# Patient Record
Sex: Female | Born: 1935
Health system: Southern US, Community
[De-identification: ages and names within clinical notes are randomized; demographics above are authoritative.]

## PROBLEM LIST (undated history)

## (undated) DIAGNOSIS — Z9889 Other specified postprocedural states: Secondary | ICD-10-CM

## (undated) DIAGNOSIS — M1990 Unspecified osteoarthritis, unspecified site: Secondary | ICD-10-CM

## (undated) DIAGNOSIS — E11319 Type 2 diabetes mellitus with unspecified diabetic retinopathy without macular edema: Secondary | ICD-10-CM

## (undated) DIAGNOSIS — I10 Essential (primary) hypertension: Secondary | ICD-10-CM

## (undated) DIAGNOSIS — E119 Type 2 diabetes mellitus without complications: Secondary | ICD-10-CM

## (undated) DIAGNOSIS — R112 Nausea with vomiting, unspecified: Secondary | ICD-10-CM

## (undated) DIAGNOSIS — H3530 Unspecified macular degeneration: Secondary | ICD-10-CM

## (undated) DIAGNOSIS — N390 Urinary tract infection, site not specified: Secondary | ICD-10-CM

## (undated) DIAGNOSIS — M549 Dorsalgia, unspecified: Secondary | ICD-10-CM

## (undated) DIAGNOSIS — R319 Hematuria, unspecified: Secondary | ICD-10-CM

## (undated) DIAGNOSIS — R3915 Urgency of urination: Principal | ICD-10-CM

## (undated) HISTORY — DX: Type 2 diabetes mellitus with unspecified diabetic retinopathy without macular edema: E11.319

## (undated) HISTORY — DX: Unspecified macular degeneration: H35.30

## (undated) HISTORY — DX: Dorsalgia, unspecified: M54.9

## (undated) HISTORY — PX: BREAST SURGERY: SHX581

## (undated) HISTORY — PX: ABDOMINAL HYSTERECTOMY: SHX81

## (undated) HISTORY — DX: Hematuria, unspecified: R31.9

## (undated) HISTORY — DX: Urinary tract infection, site not specified: N39.0

## (undated) HISTORY — PX: C-EYE SURGERY PROCEDURE: 102257504

## (undated) HISTORY — DX: Urgency of urination: R39.15

## (undated) HISTORY — PX: EYE SURGERY: SHX253

---

## 1983-04-20 HISTORY — PX: RECTOCELE REPAIR: SHX761

## 2000-09-02 ENCOUNTER — Ambulatory Visit (HOSPITAL_COMMUNITY): Admission: RE | Admit: 2000-09-02 | Discharge: 2000-09-02 | Payer: Self-pay | Admitting: Internal Medicine

## 2001-06-11 ENCOUNTER — Emergency Department (HOSPITAL_COMMUNITY): Admission: EM | Admit: 2001-06-11 | Discharge: 2001-06-11 | Payer: Self-pay | Admitting: *Deleted

## 2001-07-05 ENCOUNTER — Other Ambulatory Visit: Admission: RE | Admit: 2001-07-05 | Discharge: 2001-07-05 | Payer: Self-pay | Admitting: Obstetrics and Gynecology

## 2001-11-15 ENCOUNTER — Ambulatory Visit (HOSPITAL_COMMUNITY): Admission: RE | Admit: 2001-11-15 | Discharge: 2001-11-15 | Payer: Self-pay | Admitting: Internal Medicine

## 2001-11-15 ENCOUNTER — Encounter: Payer: Self-pay | Admitting: Internal Medicine

## 2002-08-02 ENCOUNTER — Encounter: Payer: Self-pay | Admitting: Internal Medicine

## 2002-08-02 ENCOUNTER — Ambulatory Visit (HOSPITAL_COMMUNITY): Admission: RE | Admit: 2002-08-02 | Discharge: 2002-08-02 | Payer: Self-pay | Admitting: Internal Medicine

## 2003-01-31 ENCOUNTER — Ambulatory Visit (HOSPITAL_COMMUNITY): Admission: RE | Admit: 2003-01-31 | Discharge: 2003-01-31 | Payer: Self-pay | Admitting: Obstetrics and Gynecology

## 2003-01-31 ENCOUNTER — Encounter: Payer: Self-pay | Admitting: Obstetrics and Gynecology

## 2003-05-12 ENCOUNTER — Emergency Department (HOSPITAL_COMMUNITY): Admission: EM | Admit: 2003-05-12 | Discharge: 2003-05-12 | Payer: Self-pay | Admitting: Emergency Medicine

## 2003-07-19 ENCOUNTER — Ambulatory Visit (HOSPITAL_COMMUNITY): Admission: RE | Admit: 2003-07-19 | Discharge: 2003-07-19 | Payer: Self-pay | Admitting: Internal Medicine

## 2003-08-27 ENCOUNTER — Ambulatory Visit (HOSPITAL_COMMUNITY): Admission: RE | Admit: 2003-08-27 | Discharge: 2003-08-27 | Payer: Self-pay | Admitting: Internal Medicine

## 2004-06-16 IMAGING — CR DG CHEST 2V
2 series · 2 of 2 positions shown · non-contrast
Comparison: none

CLINICAL DATA: Atypical chest pain.
 CHEST TWO VIEWS 
 Comparison 06/09/98.
 Upper normal heart size.  Normal mediastinal contours and vascularity.  Chronic bronchitic changes.  Slight interstitial prominence, chronic.  No acute infiltrate, failure, or effusion.  Minimal spur formation thoracic spine.  
 IMPRESSION
 Mild chronic bronchitic changes.  No acute abnormalities.

[view not recorded (1 of 2)]
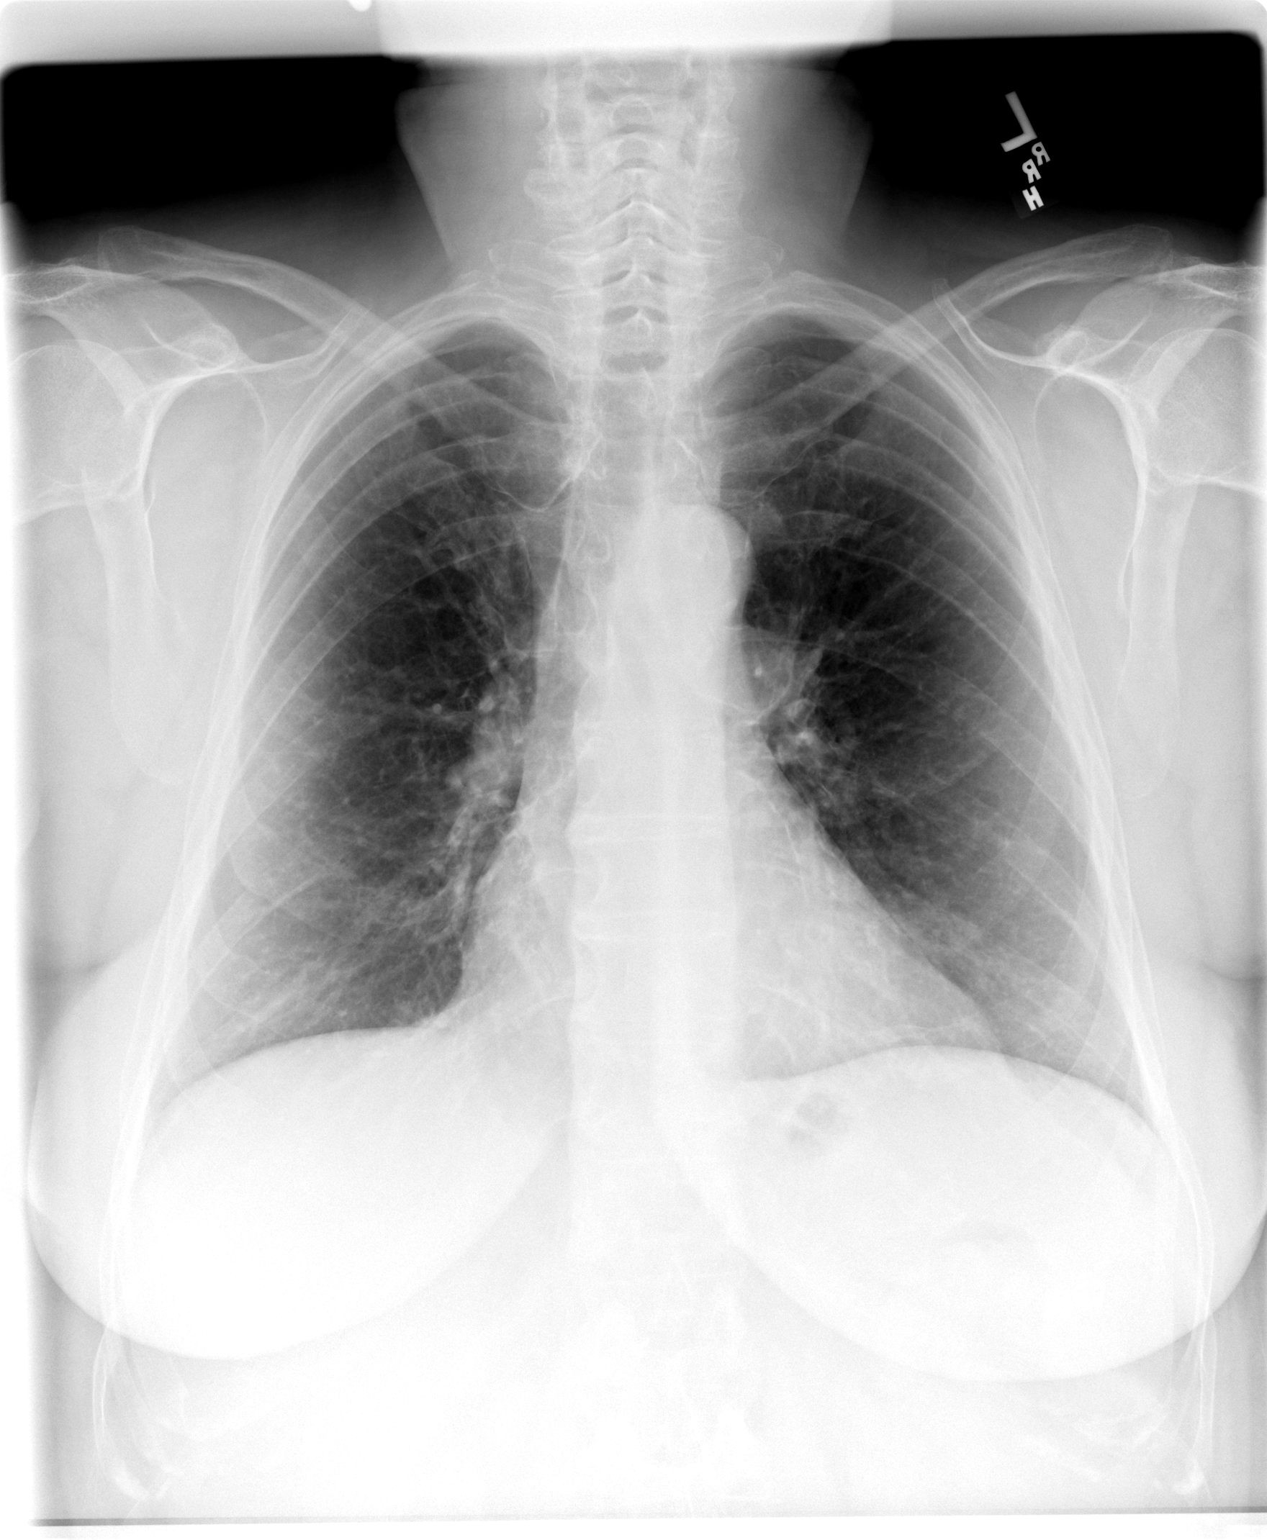

[view not recorded (2 of 2)]
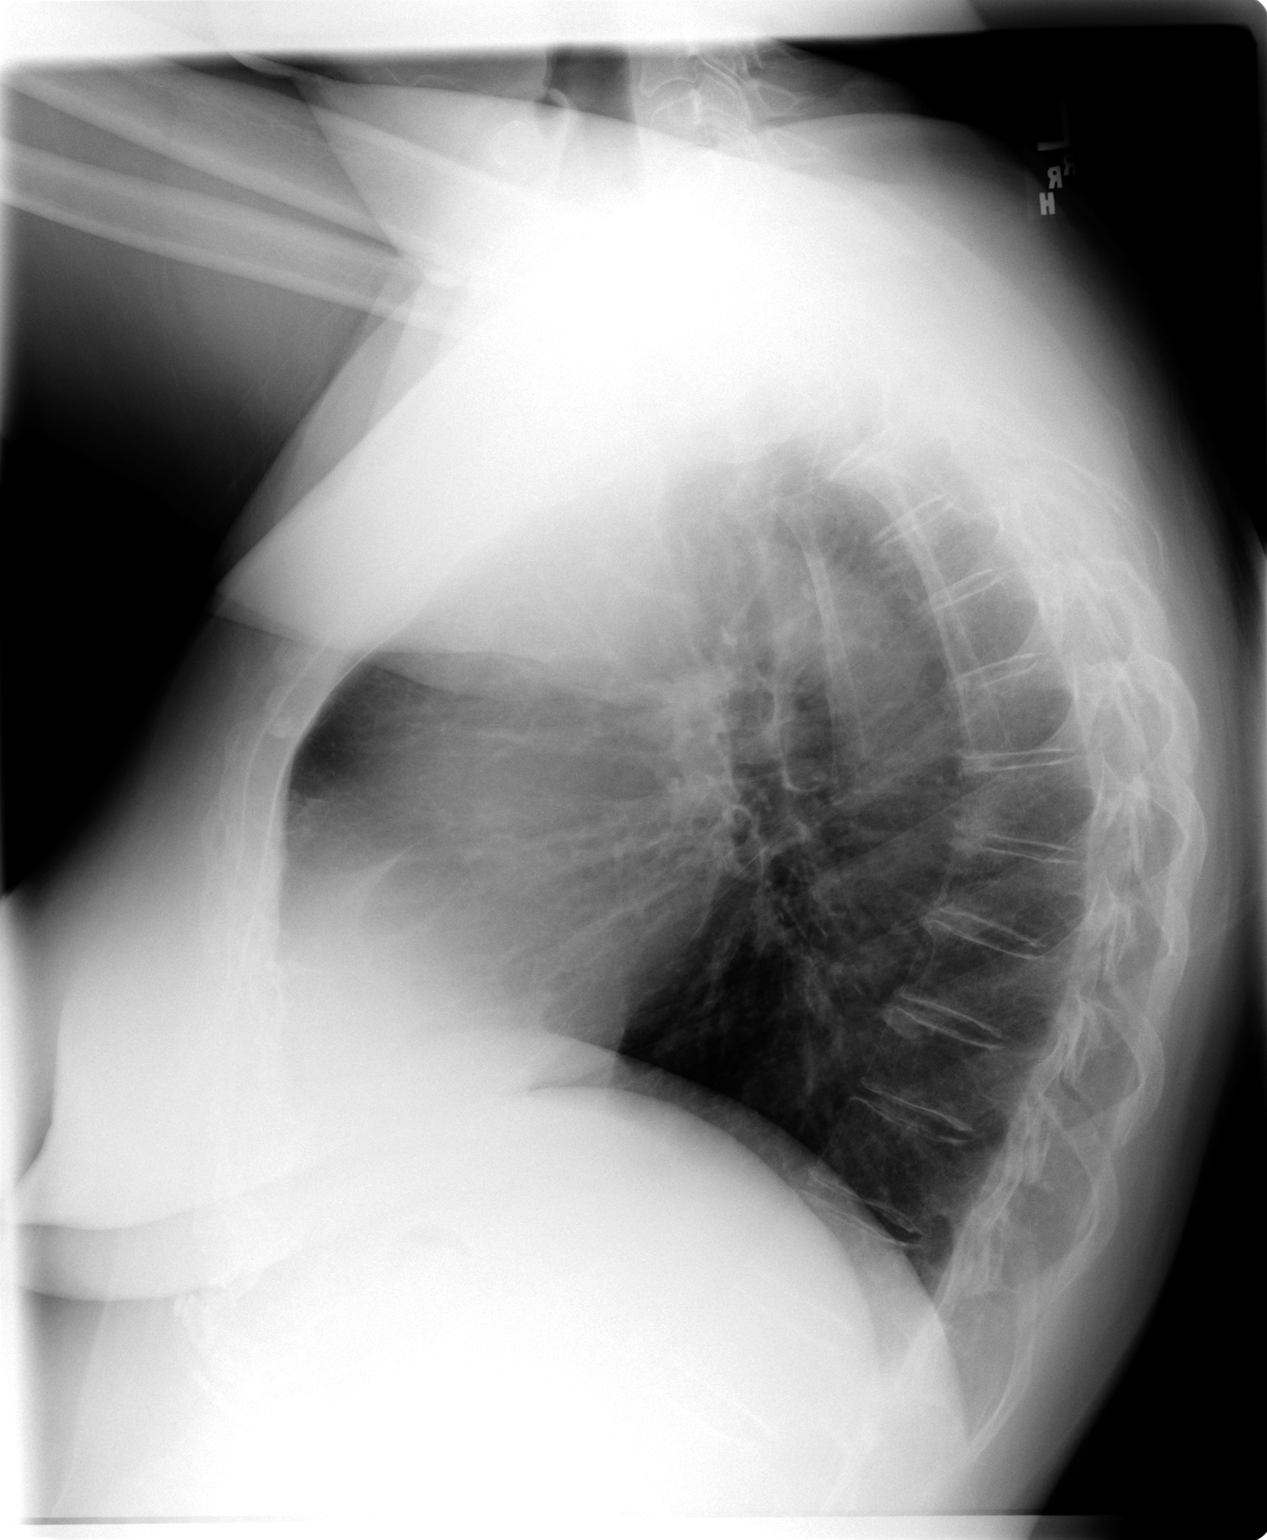

[2 of 2 positions shown; findings below may reference images not displayed]

## 2004-06-23 ENCOUNTER — Emergency Department (HOSPITAL_COMMUNITY): Admission: EM | Admit: 2004-06-23 | Discharge: 2004-06-23 | Payer: Self-pay | Admitting: *Deleted

## 2004-06-25 ENCOUNTER — Ambulatory Visit (HOSPITAL_COMMUNITY): Admission: RE | Admit: 2004-06-25 | Discharge: 2004-06-25 | Payer: Self-pay | Admitting: Internal Medicine

## 2004-09-15 ENCOUNTER — Ambulatory Visit (HOSPITAL_COMMUNITY): Admission: RE | Admit: 2004-09-15 | Discharge: 2004-09-15 | Payer: Self-pay | Admitting: Internal Medicine

## 2005-06-21 ENCOUNTER — Ambulatory Visit (HOSPITAL_COMMUNITY): Admission: RE | Admit: 2005-06-21 | Discharge: 2005-06-21 | Payer: Self-pay | Admitting: Internal Medicine

## 2005-06-21 ENCOUNTER — Ambulatory Visit: Payer: Self-pay | Admitting: Internal Medicine

## 2005-09-01 ENCOUNTER — Ambulatory Visit (HOSPITAL_COMMUNITY): Admission: RE | Admit: 2005-09-01 | Discharge: 2005-09-01 | Payer: Self-pay | Admitting: Internal Medicine

## 2005-10-06 ENCOUNTER — Ambulatory Visit (HOSPITAL_COMMUNITY): Admission: RE | Admit: 2005-10-06 | Discharge: 2005-10-06 | Payer: Self-pay | Admitting: Otolaryngology

## 2006-01-03 ENCOUNTER — Emergency Department (HOSPITAL_COMMUNITY): Admission: EM | Admit: 2006-01-03 | Discharge: 2006-01-03 | Payer: Self-pay | Admitting: Emergency Medicine

## 2006-01-06 ENCOUNTER — Ambulatory Visit (HOSPITAL_COMMUNITY): Admission: RE | Admit: 2006-01-06 | Discharge: 2006-01-06 | Payer: Self-pay | Admitting: Internal Medicine

## 2006-05-11 ENCOUNTER — Ambulatory Visit: Payer: Self-pay | Admitting: Internal Medicine

## 2006-06-02 ENCOUNTER — Ambulatory Visit: Payer: Self-pay | Admitting: Internal Medicine

## 2006-06-10 ENCOUNTER — Ambulatory Visit (HOSPITAL_COMMUNITY): Admission: RE | Admit: 2006-06-10 | Discharge: 2006-06-10 | Payer: Self-pay | Admitting: Internal Medicine

## 2006-09-22 ENCOUNTER — Ambulatory Visit (HOSPITAL_COMMUNITY): Admission: RE | Admit: 2006-09-22 | Discharge: 2006-09-22 | Payer: Self-pay | Admitting: Internal Medicine

## 2007-06-27 ENCOUNTER — Ambulatory Visit (HOSPITAL_COMMUNITY): Admission: RE | Admit: 2007-06-27 | Discharge: 2007-06-27 | Payer: Self-pay | Admitting: Internal Medicine

## 2009-08-02 ENCOUNTER — Emergency Department (HOSPITAL_COMMUNITY): Admission: EM | Admit: 2009-08-02 | Discharge: 2009-08-02 | Payer: Self-pay | Admitting: Emergency Medicine

## 2010-05-11 ENCOUNTER — Encounter: Payer: Self-pay | Admitting: Internal Medicine

## 2010-07-22 ENCOUNTER — Other Ambulatory Visit (HOSPITAL_COMMUNITY): Payer: Self-pay | Admitting: Internal Medicine

## 2010-07-22 DIAGNOSIS — Z139 Encounter for screening, unspecified: Secondary | ICD-10-CM

## 2010-08-04 ENCOUNTER — Ambulatory Visit (HOSPITAL_COMMUNITY)
Admission: RE | Admit: 2010-08-04 | Discharge: 2010-08-04 | Disposition: A | Discharge status: Home / Self Care | Payer: Medicare Other | Source: Ambulatory Visit | Attending: Internal Medicine | Admitting: Internal Medicine

## 2010-08-04 DIAGNOSIS — Z1231 Encounter for screening mammogram for malignant neoplasm of breast: Secondary | ICD-10-CM | POA: Insufficient documentation

## 2010-08-04 DIAGNOSIS — Z139 Encounter for screening, unspecified: Secondary | ICD-10-CM

## 2010-08-05 ENCOUNTER — Other Ambulatory Visit: Payer: Self-pay | Admitting: Internal Medicine

## 2010-08-05 DIAGNOSIS — R928 Other abnormal and inconclusive findings on diagnostic imaging of breast: Secondary | ICD-10-CM

## 2010-08-12 ENCOUNTER — Encounter (INDEPENDENT_AMBULATORY_CARE_PROVIDER_SITE_OTHER): Payer: Medicare Other | Admitting: Internal Medicine

## 2010-08-12 ENCOUNTER — Ambulatory Visit (HOSPITAL_COMMUNITY)
Admission: RE | Admit: 2010-08-12 | Discharge: 2010-08-12 | Disposition: A | Discharge status: Home / Self Care | Payer: Medicare Other | Source: Ambulatory Visit | Attending: Internal Medicine | Admitting: Internal Medicine

## 2010-08-12 DIAGNOSIS — K644 Residual hemorrhoidal skin tags: Secondary | ICD-10-CM

## 2010-08-12 DIAGNOSIS — Z860100 Personal history of colon polyps, unspecified: Secondary | ICD-10-CM | POA: Insufficient documentation

## 2010-08-12 DIAGNOSIS — Z09 Encounter for follow-up examination after completed treatment for conditions other than malignant neoplasm: Secondary | ICD-10-CM | POA: Insufficient documentation

## 2010-08-12 DIAGNOSIS — Z794 Long term (current) use of insulin: Secondary | ICD-10-CM | POA: Insufficient documentation

## 2010-08-12 DIAGNOSIS — Z79899 Other long term (current) drug therapy: Secondary | ICD-10-CM | POA: Insufficient documentation

## 2010-08-12 DIAGNOSIS — E119 Type 2 diabetes mellitus without complications: Secondary | ICD-10-CM | POA: Insufficient documentation

## 2010-08-12 DIAGNOSIS — I10 Essential (primary) hypertension: Secondary | ICD-10-CM | POA: Insufficient documentation

## 2010-08-13 LAB — GLUCOSE, CAPILLARY: Glucose-Capillary: 168 mg/dL — ABNORMAL HIGH (ref 70–99)

## 2010-08-19 ENCOUNTER — Ambulatory Visit (HOSPITAL_COMMUNITY)
Admission: RE | Admit: 2010-08-19 | Discharge: 2010-08-19 | Disposition: A | Discharge status: Home / Self Care | Payer: Medicare Other | Source: Ambulatory Visit | Attending: Internal Medicine | Admitting: Internal Medicine

## 2010-08-19 DIAGNOSIS — R928 Other abnormal and inconclusive findings on diagnostic imaging of breast: Secondary | ICD-10-CM | POA: Insufficient documentation

## 2010-08-28 NOTE — Op Note (Signed)
  [name], [name]                ACCOUNT NO.:  [id number]  MEDICAL RECORD NO.:  [id number]           PATIENT TYPE:  O  LOCATION:  [facility]YP                          FACILITY:  [facility]  PHYSICIAN:  Lionel December, M.D.    DATE OF BIRTH:  1932/06/01  DATE OF PROCEDURE:  08/12/2010 DATE OF DISCHARGE:                              OPERATIVE REPORT   PROCEDURE:  Colonoscopy.  INDICATION:  [name] is a 77 year old Caucasian female, who has a history of colonic adenomas.  Her last exam was in March 2007.  She is undergoing surveillance exam.  She has no GI symptoms.  Family history is negative for colorectal carcinoma.  Procedures were reviewed with the patient and informed consent was obtained.  MEDS FOR CONSCIOUS SEDATION:  Demerol 50 mg, Versed 5 mg.  FINDINGS:  Procedure performed in endoscopy suite.  The patient's vital signs and O2 sat were monitored during the procedure and remained stable.  The patient was placed in left lateral recumbent position. Rectal examination performed.  No abnormality noted on external exam or digital exam.  Pentax videoscope was placed through rectum and advanced under vision into sigmoid colon beyond.  Preparation was excellent. Once the scope was at splenic flexure, the patient was turned into supine position and scope was easily advanced into cecum, which was identified by appendiceal orifice and ileocecal valve.  Pictures were taken for the record.  As the scope was withdrawn, colonic mucosa was carefully examined and was normal throughout.  Rectal mucosa similarly was normal.  Scope was retroflexed to examine anorectal junction and hemorrhoids were noted below the dentate line, one was prominent. Endoscope was straightened and withdrawn.  Withdrawal time was 9 minutes.  The patient tolerated the procedure well.  FINAL DIAGNOSIS:  Normal colonoscopy except external hemorrhoids.  RECOMMENDATIONS:  Standard instructions given.  [name] may not  need another colonoscopy, but I have recommended she should come back and see [place] in the office in 5 years and then we could determine whether or not she should have any more exams.          ______________________________ Lionel December, M.D.     NR/MEDQ  D:  08/12/2010  T:  08/12/2010  Job:  [id number]  cc:   Kingsley Callander. Ouida Sills, MD Fax: [phone]  Electronically Signed by Lionel December M.D. on 08/28/2010 12:15:12 PM

## 2010-09-04 NOTE — Op Note (Signed)
[name], BOESEN                ACCOUNT NO.:  [id number]   MEDICAL RECORD NO.:  [id number]          PATIENT TYPE:  AMB   LOCATION:  DAY                           FACILITY:  [facility]   PHYSICIAN:  Lionel December, M.D.    DATE OF BIRTH:  11/21/1932   DATE OF PROCEDURE:  06/21/2005  DATE OF DISCHARGE:                                 OPERATIVE REPORT   PROCEDURE:  Colonoscopy.   INDICATIONS:  Encarnacion is a 77 year old Caucasian female who is here for  surveillance at a colonoscopy. Her last exam was five years ago with removal  of the tubular adenoma. She remains asymptomatic from GI standpoint, and  family history is negative for colorectal carcinoma. Procedure and risks  were reviewed with the patient, and informed consent was obtained.   MEDICINES FOR CONSCIOUS SEDATION:  Demerol 50 mg IV, Versed 4 mg IV.   FINDINGS:  Procedure performed in endoscopy suite. The patient's vital signs  and O2 saturation were monitored during the procedure and remained stable.  The patient was placed in left lateral position and rectal examination  performed. No abnormality noted on external or digital exam. Olympus  videoscope was placed in rectum and advanced under vision into sigmoid colon  and beyond. Preparation was excellent. Scope was passed into cecum which was  identified by appendiceal orifice and ileocecal valve. Pictures taken for  the record. As the scope was withdrawn, colonic mucosa was carefully  examined for the second time, and there were no polyps and/or tumor masses.  Rectal mucosa similarly was normal. Scope was retroflexed to examine  anorectal junction, and moderate-sized hemorrhoids were noted below the  dentate line. Endoscope was straightened and withdrawn. The patient  tolerated the procedure well.   FINAL DIAGNOSIS:  Normal colonoscopy except external hemorrhoids.   RECOMMENDATIONS:  She should continue with yearly Hemoccults. She may  consider next exam in five  years.      Lionel December, M.D.  Electronically Signed     NR/MEDQ  D:  06/21/2005  T:  06/22/2005  Job:  [id number]   cc:   Kingsley Callander. Ouida Sills, MD  Fax: [phone]

## 2010-10-13 ENCOUNTER — Ambulatory Visit (HOSPITAL_COMMUNITY)
Admission: RE | Admit: 2010-10-13 | Discharge: 2010-10-13 | Disposition: A | Discharge status: Home / Self Care | Payer: Medicare Other | Source: Ambulatory Visit | Attending: Orthopaedic Surgery | Admitting: Orthopaedic Surgery

## 2010-10-13 DIAGNOSIS — M6281 Muscle weakness (generalized): Secondary | ICD-10-CM | POA: Insufficient documentation

## 2010-10-13 DIAGNOSIS — E119 Type 2 diabetes mellitus without complications: Secondary | ICD-10-CM | POA: Insufficient documentation

## 2010-10-13 DIAGNOSIS — IMO0001 Reserved for inherently not codable concepts without codable children: Secondary | ICD-10-CM | POA: Insufficient documentation

## 2010-10-13 DIAGNOSIS — M542 Cervicalgia: Secondary | ICD-10-CM | POA: Insufficient documentation

## 2010-10-13 DIAGNOSIS — M25519 Pain in unspecified shoulder: Secondary | ICD-10-CM | POA: Insufficient documentation

## 2010-10-16 ENCOUNTER — Ambulatory Visit (HOSPITAL_COMMUNITY)
Admission: RE | Admit: 2010-10-16 | Discharge: 2010-10-16 | Disposition: A | Discharge status: Home / Self Care | Payer: Medicare Other | Source: Ambulatory Visit | Attending: Internal Medicine | Admitting: Internal Medicine

## 2010-10-20 ENCOUNTER — Ambulatory Visit (HOSPITAL_COMMUNITY)
Admission: RE | Admit: 2010-10-20 | Discharge: 2010-10-20 | Disposition: A | Discharge status: Home / Self Care | Payer: Medicare Other | Source: Ambulatory Visit | Attending: Orthopaedic Surgery | Admitting: Orthopaedic Surgery

## 2010-10-20 DIAGNOSIS — E119 Type 2 diabetes mellitus without complications: Secondary | ICD-10-CM | POA: Insufficient documentation

## 2010-10-20 DIAGNOSIS — M6281 Muscle weakness (generalized): Secondary | ICD-10-CM | POA: Insufficient documentation

## 2010-10-20 DIAGNOSIS — IMO0001 Reserved for inherently not codable concepts without codable children: Secondary | ICD-10-CM | POA: Insufficient documentation

## 2010-10-20 DIAGNOSIS — M542 Cervicalgia: Secondary | ICD-10-CM | POA: Insufficient documentation

## 2010-10-20 DIAGNOSIS — M25519 Pain in unspecified shoulder: Secondary | ICD-10-CM | POA: Insufficient documentation

## 2010-10-22 ENCOUNTER — Ambulatory Visit (HOSPITAL_COMMUNITY)
Admission: RE | Admit: 2010-10-22 | Discharge: 2010-10-22 | Disposition: A | Discharge status: Home / Self Care | Payer: Medicare Other | Source: Ambulatory Visit | Attending: Internal Medicine | Admitting: Internal Medicine

## 2010-10-27 ENCOUNTER — Ambulatory Visit (HOSPITAL_COMMUNITY)
Admission: RE | Admit: 2010-10-27 | Discharge: 2010-10-27 | Disposition: A | Discharge status: Home / Self Care | Payer: Medicare Other | Source: Ambulatory Visit | Attending: Internal Medicine | Admitting: Internal Medicine

## 2010-10-27 DIAGNOSIS — M6281 Muscle weakness (generalized): Secondary | ICD-10-CM | POA: Insufficient documentation

## 2010-10-27 DIAGNOSIS — M25519 Pain in unspecified shoulder: Secondary | ICD-10-CM | POA: Insufficient documentation

## 2010-10-27 DIAGNOSIS — M542 Cervicalgia: Secondary | ICD-10-CM | POA: Insufficient documentation

## 2010-10-27 NOTE — Progress Notes (Addendum)
Occupational Therapy Treatment  Patient Name: [name] MRN: [id number] Today's Date: 10/27/2010  HPI: Pain Assessment Currently in Pain?: Yes Pain Score:   6 Pain Location: Neck Pain Orientation: Right Pain Type: Chronic pain Pain Radiating Towards: up neck Pain Onset: Other (comment) (chronic but has increased in the last few days)  Precautions/Restrictions    Mobility       Exercise/Treatments @FLOW [phone]  Goals Long Term Goals Long Term Goal 1: Independent with HEP and pain control techniques Long Term Goal 1 Progress: Progressing toward goal Long Term Goal 2: Decrease pain in right shoulder and neck to 2/10 Long Term Goal 2 Progress: Progressing toward goal Long Term Goal 3: Increase cervical and shoulder AROM to WNL for increased safety when driving Long Term Goal 3 Progress: Progressing toward goal Long Term Goal 4: Decrease right cervical and shoulder fascial restrictions to minimal Long Term Goal 4 Progress: Progressing toward goal Long Term Goal 5: Increase right grip strength by 15 pounds for increased independence opening containers Long Term Goal 5 Progress: Progressing toward goal End of Session Patient Active Problem List  Diagnoses  . Muscle weakness (generalized)  . Cervicalgia  . Pain in joint, shoulder region   End of Session Activity Tolerance: Patient tolerated treatment well OT Assessment and Plan Clinical Impression Statement: added 1 pound to supine exercises, educated patient on joint protection techniques and good body mechanics to help with pain management. Prognosis: Good OT Duration: 6 weeks OT  Treatment/Interventions: Therapeutic exercise;Patient/family education;Manual therapy OT Plan: Increase reps with supine and seated   Tamy Accardo L 10/27/2010, 9:34 AM

## 2010-10-30 ENCOUNTER — Ambulatory Visit (HOSPITAL_COMMUNITY)
Admission: RE | Admit: 2010-10-30 | Discharge: 2010-10-30 | Disposition: A | Discharge status: Home / Self Care | Payer: Medicare Other | Source: Ambulatory Visit | Attending: Internal Medicine | Admitting: Internal Medicine

## 2010-10-30 NOTE — Progress Notes (Signed)
Occupational Therapy Treatment  Patient Name: [name] MRN: [id number] Today's Date: 10/30/2010        Time in:  8:06  Time out:  9:09  HPI: Symptoms/Limitations Symptoms: It is still hurting but it is ok. Pain Assessment Currently in Pain?: Yes Pain Score:   5 Pain Location: Shoulder Pain Orientation: Right Pain Type: Chronic pain Pain Radiating Towards: up neck and down arm Pain Relieving Factors: MFR helped relax and relieve the pain, per patient report.  Precautions/Restrictions    Mobility       Exercise/Treatments Cervical Exercises Neck Flexion: AROM;10 reps;Seated Neck Extension: AROM;10 reps;Seated Neck Lateral Flexion - Right: 10 reps;AROM;Seated Neck Lateral Flexion - Left: AROM;10 reps;Seated Neck Rotation - Right: 10 reps;AROM;Seated Neck Rotation - Left: AROM;10 reps;Seated Shoulder Flexion: PROM;Strengthening;10 reps;Seated (seated with 1#) Shoulder Extension: Theraband Theraband Level (Shoulder Extension): Level 3 (Green) (x10) Shoulder ABduction: PROM;Strengthening;10 reps;Supine;Seated (seated with 1#) Therapy Ball (Shoulder Abduction): x20 Shoulder Horizontal ABduction: PROM;Strengthening;10 reps;Seated;Right (seated with 1#) Theraband Level (Shoulder Retraction): Level 3 (Green) (x10) Row: Theraband (x10) Shoulder Exercises Shoulder Flexion: PROM;Strengthening;10 reps;Seated (seated with 1#) Shoulder Extension: Theraband Theraband Level (Shoulder Extension): Level 3 (Green) (x10) Theraband Level (Shoulder Retraction): Level 3 (Green) (x10) Shoulder Horizontal ABduction: PROM;Strengthening;10 reps;Seated;Right (seated with 1#) Shoulder ABduction: PROM;Strengthening;10 reps;Supine;Seated (seated with 1#) Therapy Ball (Shoulder Abduction): x20 Row: Theraband (x10) Additional ROM/Strengthening Exercises "W" Arms: x 15 Neurological Re-education Exercises Shoulder Flexion: PROM;Strengthening;10 reps;Seated (seated with 1#) Shoulder ABduction:  PROM;Strengthening;10 reps;Supine;Seated (seated with 1#) Therapy Ball (Shoulder Abduction): x20 Shoulder Horizontal ABduction: PROM;Strengthening;10 reps;Seated;Right (seated with 1#) Manual Therapy Manual Therapy: Myofascial release   Goals   End of Session Patient Active Problem List  Diagnoses  . Muscle weakness (generalized)  . Cervicalgia  . Pain in joint, shoulder region   End of Session Activity Tolerance: Patient tolerated treatment well OT Assessment and Plan Clinical Impression Statement: ROM/Strength: cervical flexion full (full), cervical extension full (full), rotation right 3/4 (1/2-3/4), rotation left full (full), lateral flexion right 1/2 (1/2), lateral flexion left 1/2 (1/2), shoulder flexion 140 4+/5 (121), abduction 135 4+/5 (90), external rotation 90 4+/5 (85), internal rotation 75 4+/5 (65), grip strength right 35 # (34 #), left 50 # (44#) Prognosis: Good OT Frequency: Min 2X/week OT Duration: 4 weeks OT Plan: Continue current treatment.  [name] has met 2/4 short term goals and 0 long term goals.  I would like to continue treating her 3 times per week for 4 weeks.     Vaughan Browner 10/30/2010, 10:40 AM  Visit 6/12

## 2010-11-03 ENCOUNTER — Ambulatory Visit (HOSPITAL_COMMUNITY)
Admission: RE | Admit: 2010-11-03 | Discharge: 2010-11-03 | Disposition: A | Discharge status: Home / Self Care | Payer: Medicare Other | Source: Ambulatory Visit | Attending: Internal Medicine | Admitting: Internal Medicine

## 2010-11-03 NOTE — Progress Notes (Signed)
Occupational Therapy Treatment  Patient Name: [name] MRN: [id number] Today's Date: 11/03/2010 Time In 806 Time Out 903 Manual therapy 806-830 Therapeutic Exercise [id number] Visit 7/16 Reassess on 11/27/10 HPI: Symptoms/Limitations Symptoms: "My neck always has a nagging pain." Pain Assessment Currently in Pain?: Yes Pain Location: Neck Pain Orientation: Posterior Pain Type: Chronic pain  Precautions/Restrictions    Mobility       Exercise/Treatments Cervical Exercises Neck Flexion: 15 reps;AROM;Seated Neck Extension: Seated;AROM;15 reps Neck Lateral Flexion - Right: Seated;AROM;15 reps Neck Lateral Flexion - Left: Seated;AROM;15 reps Neck Rotation - Right: Seated;AROM;15 reps Neck Rotation - Left: Seated;AROM;15 reps Shoulder Flexion: PROM;Strengthening;10 reps;Seated;Other (comment) (2 pounds) Therapy Ball (Shoulder Flexion): x 20 Shoulder Extension: Theraband (green x 15) Shoulder ABduction: PROM;Strengthening;10 reps;Supine;Seated;Other (comment) (2 pounds) Therapy Ball (Shoulder Abduction): x20 (added right and left x 10 each direction) Shoulder Horizontal ABduction: PROM;Strengthening;10 reps;Seated;Right Shoulder Retraction:  (2 pounds) Theraband Level (Shoulder Retraction):  (theraband green x 15 reps) Row: Theraband;15 reps (green) Shoulder Internal Rotation: PROM;Strengthening;10 reps;Seated;Supine;Right (2 pounds) Shoulder External Rotation: PROM;Strengthening;Right;10 reps;Supine;Seated (2 pounds) UBE (Upper Arm Bike):  (3 min and 3 min 1.5) Shoulder Exercises Shoulder Flexion: PROM;Strengthening;10 reps;Seated;Other (comment) (2 pounds) Therapy Ball (Shoulder Flexion): x 20 Shoulder Extension: Theraband (green x 15) Shoulder Retraction:  (2 pounds) Theraband Level (Shoulder Retraction):  (theraband green x 15 reps) Shoulder Protraction: Supine;PROM;10 reps;Seated;Strengthening (2 pounds 10 reps) Shoulder Horizontal ABduction: PROM;Strengthening;10  reps;Seated;Right Shoulder ABduction: PROM;Strengthening;10 reps;Supine;Seated;Other (comment) (2 pounds) Therapy Ball (Shoulder Abduction): x20 (added right and left x 10 each direction) Shoulder External Rotation: PROM;Strengthening;Right;10 reps;Supine;Seated (2 pounds) Shoulder Internal Rotation: PROM;Strengthening;10 reps;Seated;Supine;Right (2 pounds) Row: Theraband;15 reps (green) Additional ROM/Strengthening Exercises UBE (Upper Arm Bike):  (3 min and 3 min 1.5) Wall Wash:  (2 min without weight) "W" Arms:  (2 pounds x 10 reps also "x to v" with 2 pounds x 10 reps) Additional Elbow Exercises UBE (Upper Arm Bike):  (3 min and 3 min 1.5) Neurological Re-education Exercises Shoulder Flexion: PROM;Strengthening;10 reps;Seated;Other (comment) (2 pounds) Therapy Ball (Shoulder Flexion): x 20 Shoulder ABduction: PROM;Strengthening;10 reps;Supine;Seated;Other (comment) (2 pounds) Therapy Ball (Shoulder Abduction): x20 (added right and left x 10 each direction) Shoulder Protraction: Supine;PROM;10 reps;Seated;Strengthening (2 pounds 10 reps) Shoulder Horizontal ABduction: PROM;Strengthening;10 reps;Seated;Right Shoulder External Rotation: PROM;Strengthening;Right;10 reps;Supine;Seated (2 pounds) Shoulder Internal Rotation: PROM;Strengthening;10 reps;Seated;Supine;Right (2 pounds) Work Water engineer (Upper Arm Bike):  (3 min and 3 min 1.5) Manual Therapy Manual Therapy: Myofascial release Myofascial Release: MFR and manual stretching to right shoulder, upper arm, scapular region, manual cervical traction, MFR to SCM, trapezius, rhomboids, and suboccipital region to decrease restrictions and increase pain free motion (MFR and manual stretching to right shoulder, upper arm, scap) All exercises completed one time only.    Goals Long Term Goals Long Term Goal 1 Progress: Progressing toward goal Long Term Goal 2 Progress: Progressing toward goal Long Term Goal 3 Progress:  Progressing toward goal Long Term Goal 4 Progress: Progressing toward goal Long Term Goal 5 Progress: Progressing toward goal End of Session Patient Active Problem List  Diagnoses  . Muscle weakness (generalized)  . Cervicalgia  . Pain in joint, shoulder region   End of Session Activity Tolerance: Patient tolerated treatment well General Behavior During Session: [facility] for tasks performed Cognition: [facility] for tasks performed OT Assessment and Plan Clinical Impression Statement: A:  increased to 2 pounds with seated strengthening, x to v, and w arms.  Added wall wash and circle right and left with ball.   (A:  increased  to 2 pounds with seated strengthening, x to v,) OT Plan: Increase to 12 reps with seated strengthening, including x to v and w arms.  add cybex press and row.  Educated/issued patient on HEP for seated strengthening for shoulder flexion, abduction, internal rotation, external rotation.  Sondra Barges Matheny 11/03/2010, 9:12 AM

## 2010-11-03 NOTE — Patient Instructions (Signed)
Added seated strengthening exercises for shoulder flexion, abduction, external rotation, internal rotation to HEP.

## 2010-11-05 ENCOUNTER — Ambulatory Visit (HOSPITAL_COMMUNITY)
Admission: RE | Admit: 2010-11-05 | Discharge: 2010-11-05 | Disposition: A | Discharge status: Home / Self Care | Payer: Medicare Other | Source: Ambulatory Visit | Attending: Internal Medicine | Admitting: Internal Medicine

## 2010-11-05 NOTE — Progress Notes (Signed)
Occupational Therapy Treatment  Patient Name: [name] MRN: [id number] Today's Date: 11/05/2010 Today's Date: 11/03/2010  Time In 805 Time Out 904  Manual therapy 805-830  Therapeutic Exercise [id number]  Visit 8/16  Reassess on 11/27/10  HPI: Symptoms/Limitations Symptoms: "believe it or not, I feel a little better today." Pain Assessment Currently in Pain?: Yes Pain Score:   2 Pain Location: Neck  Precautions/Restrictions    Mobility       Exercise/Treatments Cervical Exercises Neck Flexion: 15 reps;AROM;Seated Neck Extension: Seated;AROM;15 reps Neck Lateral Flexion - Right: Seated;AROM;15 reps Neck Lateral Flexion - Left: Seated;AROM;15 reps Neck Rotation - Right: Seated;AROM;15 reps Neck Rotation - Left: Seated;AROM;15 reps Shoulder Flexion: PROM;Strengthening;10 reps;Seated (12 reps strengthening seated.) Therapy Ball (Shoulder Flexion): x 20 Shoulder Extension: Theraband Theraband Level (Shoulder Extension):  (green x 20) Shoulder ABduction: PROM;Strengthening;10 reps;Supine;Seated;Other (comment) (12 reps seated strengthening) Therapy Ball (Shoulder Abduction): x20 (also righ tand left x 10 each) Shoulder Horizontal ABduction: PROM;Strengthening;10 reps;Seated;Right (12 reps with 2 pounds) Shoulder Retraction:  (seated strengthening 12 reps) Theraband Level (Shoulder Retraction):  (green x 20) Row:  (green therabanc x 20) Shoulder Internal Rotation: PROM;Strengthening;10 reps;Seated;Supine;Right Shoulder External Rotation: PROM;Strengthening;Right;10 reps;Supine;Seated (seated strengthening 2 pounds 12 reps) UBE (Upper Arm Bike):  (3' and 3" 2.0) Shoulder Exercises Shoulder Flexion: PROM;Strengthening;10 reps;Seated (12 reps strengthening seated.) Therapy Ball (Shoulder Flexion): x 20 Shoulder Extension: Theraband Theraband Level (Shoulder Extension):  (green x 20) Shoulder Retraction:  (seated strengthening 12 reps) Theraband Level (Shoulder Retraction):   (green x 20) Shoulder Protraction: Supine;PROM;10 reps;Seated;Strengthening (seated strengthening 12 reps with 2 pounds) Shoulder Horizontal ABduction: PROM;Strengthening;10 reps;Seated;Right (12 reps with 2 pounds) Shoulder ABduction: PROM;Strengthening;10 reps;Supine;Seated;Other (comment) (12 reps seated strengthening) Therapy Ball (Shoulder Abduction): x20 (also righ tand left x 10 each) Shoulder External Rotation: PROM;Strengthening;Right;10 reps;Supine;Seated (seated strengthening 2 pounds 12 reps) Shoulder Internal Rotation: PROM;Strengthening;10 reps;Seated;Supine;Right Row:  (green therabanc x 20) Additional ROM/Strengthening Exercises UBE (Upper Arm Bike):  (3' and 3" 2.0) Cybex Press: 1 plate x 10 reps (1 plate x 10 reps) Cybex Row: 1 plate x 10 reps Wall Wash:  (2 min with 1 pound) "W" Arms:  (2 pounds 15 reps also "x to v" 12 reps with 2 pounds) Additional Elbow Exercises UBE (Upper Arm Bike):  (3' and 3" 2.0) Cybex Press: 1 plate x 10 reps (1 plate x 10 reps) Cybex Row: 1 plate x 10 reps Neurological Re-education Exercises Shoulder Flexion: PROM;Strengthening;10 reps;Seated (12 reps strengthening seated.) Therapy Ball (Shoulder Flexion): x 20 Shoulder ABduction: PROM;Strengthening;10 reps;Supine;Seated;Other (comment) (12 reps seated strengthening) Therapy Ball (Shoulder Abduction): x20 (also righ tand left x 10 each) Shoulder Protraction: Supine;PROM;10 reps;Seated;Strengthening (seated strengthening 12 reps with 2 pounds) Shoulder Horizontal ABduction: PROM;Strengthening;10 reps;Seated;Right (12 reps with 2 pounds) Shoulder External Rotation: PROM;Strengthening;Right;10 reps;Supine;Seated (seated strengthening 2 pounds 12 reps) Shoulder Internal Rotation: PROM;Strengthening;10 reps;Seated;Supine;Right Work Immunologist UBE (Upper Arm Bike):  (3' and 3" 2.0) Manual Therapy Manual Therapy: Myofascial release Myofascial Release: MFR and manual stretching to right  shoulder, upper arm, scapular region, manual cervical traction, and sub occipital release to decrease restrictions and increase pain free motion. Exercises completed one time only.  Goals Long Term Goals Long Term Goal 1 Progress: Progressing toward goal Long Term Goal 2 Progress: Progressing toward goal Long Term Goal 3 Progress: Progressing toward goal Long Term Goal 4 Progress: Progressing toward goal Long Term Goal 5 Progress: Progressing toward goal End of Session Patient Active Problem List  Diagnoses  . Muscle weakness (generalized)  . Cervicalgia  .  Pain in joint, shoulder region   End of Session Activity Tolerance: Patient tolerated treatment well General Behavior During Session: [facility] for tasks performed Cognition: [facility] for tasks performed OT Assessment and Plan Clinical Impression Statement: A:  Increased to 12 reps with 2 pounds with all seated strengthening.  Added 1 pound to wall wash and added cybex press and row. OT Plan: P:  Increase to 2 pounds with wall wash.     Sondra Barges Gorham 11/05/2010, 10:29 AM

## 2010-11-06 ENCOUNTER — Ambulatory Visit (HOSPITAL_COMMUNITY): Payer: Medicare Other | Admitting: Occupational Therapy

## 2010-11-10 ENCOUNTER — Ambulatory Visit (HOSPITAL_COMMUNITY)
Admission: RE | Admit: 2010-11-10 | Discharge: 2010-11-10 | Disposition: A | Discharge status: Home / Self Care | Payer: Medicare Other | Source: Ambulatory Visit | Attending: Internal Medicine | Admitting: Internal Medicine

## 2010-11-10 NOTE — Progress Notes (Signed)
Occupational Therapy Treatment  Patient Name: [name] MRN: [id number] Today's Date: 11/10/2010   OT Time in:  8:35          Time out:9:17      Manual Therapy: 8:35-8:50 Visit  9/16 Reassessment date: 11/27/10     HPI: Symptoms/Limitations Symptoms: I feel good, I have not had any pain, have been using weights and it feels good. Pain Assessment Currently in Pain?: No/denies  Precautions/Restrictions    Mobility       Exercise/Treatments Cervical Exercises Neck Flexion: 15 reps;AROM;Seated Neck Extension: Seated;AROM;15 reps Neck Lateral Flexion - Right: Seated;AROM;15 reps Neck Lateral Flexion - Left: Seated;AROM;15 reps Neck Rotation - Right: Seated;AROM;15 reps Neck Rotation - Left: Seated;AROM;15 reps Shoulder Flexion: PROM;Supine Therapy Ball (Shoulder Flexion): x 20 Shoulder ABduction: PROM;Supine Therapy Ball (Shoulder Abduction): x20 (10 right/10 left) Shoulder Horizontal ABduction: PROM;Supine Shoulder Internal Rotation: PROM;Supine Shoulder External Rotation: PROM;Supine UBE (Upper Arm Bike): 3' forward 3' reverse 2.5 resistance Shoulder Exercises Shoulder Flexion: PROM;Supine Therapy Ball (Shoulder Flexion): x 20 Shoulder Protraction: PROM;Supine Shoulder Horizontal ABduction: PROM;Supine Shoulder ABduction: PROM;Supine Therapy Ball (Shoulder Abduction): x20 (10 right/10 left) Shoulder External Rotation: PROM;Supine Shoulder Internal Rotation: PROM;Supine Additional ROM/Strengthening Exercises UBE (Upper Arm Bike): 3' forward 3' reverse 2.5 resistance Cybex Press: time Cybex Row: time Wall Wash: time "W" Arms: time Additional Elbow Exercises UBE (Upper Arm Bike): 3' forward 3' reverse 2.5 resistance Cybex Press: time Cybex Row: time Neurological Re-education Exercises Shoulder Flexion: PROM;Supine Therapy Ball (Shoulder Flexion): x 20 Shoulder ABduction: PROM;Supine Therapy Ball (Shoulder Abduction): x20 (10 right/10 left) Shoulder  Protraction: PROM;Supine Shoulder Horizontal ABduction: PROM;Supine Shoulder External Rotation: PROM;Supine Shoulder Internal Rotation: PROM;Supine Work Hardening Exercises UBE (Upper Arm Bike): 3' forward 3' reverse 2.5 resistance Manual Therapy Myofascial Release: MFR and manuel stretching to right UE to decrease restricions and increase rom.  8:35 to 8:50   Goals   End of Session Patient Active Problem List  Diagnoses  . Muscle weakness (generalized)  . Cervicalgia  . Pain in joint, shoulder region   OT Assessment and Plan Clinical Impression Statement: Pt. needed to leave early to pick up son from doctor, unable to complete all exercises. OT Plan: resume missed ex and increase wall wash weight to 2lbs.   [name], [name] 11/10/2010, 9:16 AM

## 2010-11-12 ENCOUNTER — Ambulatory Visit (HOSPITAL_COMMUNITY)
Admission: RE | Admit: 2010-11-12 | Discharge: 2010-11-12 | Disposition: A | Discharge status: Home / Self Care | Payer: Medicare Other | Source: Ambulatory Visit | Attending: Internal Medicine | Admitting: Internal Medicine

## 2010-11-12 NOTE — Progress Notes (Signed)
Occupational Therapy Treatment  Patient Name: [name] MRN: [id number] Today's Date: 11/12/2010   Time In 835 Time Out 905  Manual therapy [phone] Therapeutic Exercise [id number]  Visit 10/16  Reassess on 11/27/10      HPI: Symptoms/Limitations Symptoms: I pushed mowed my yard yesterday, I am really sore. Pain Assessment Currently in Pain?: Yes Pain Score:   5 Pain Location: Shoulder Pain Orientation: Right;Upper Pain Type: Chronic pain           Exercise/Treatments Cervical Exercises Neck Flexion: 15 reps;AROM;Seated Neck Extension: Seated;AROM;15 reps Neck Lateral Flexion - Right: Seated;AROM;15 reps Neck Lateral Flexion - Left: Seated;AROM;15 reps Neck Rotation - Right: Seated;AROM;15 reps Neck Rotation - Left: Seated;AROM;15 reps Shoulder Flexion: PROM;Supine;Strengthening;Right;Seated;15 reps Bar Weights/Barbell (Shoulder Flexion): 2 lbs Therapy Ball (Shoulder Flexion): x 20 Shoulder Extension: 15 reps Theraband Level (Shoulder Extension): Level 3 (Green) Shoulder ABduction: PROM;Supine;Strengthening;Right;Seated;15 reps Bar Weights/Barbell (Shoulder Abduction): 2 lbs Therapy Ball (Shoulder Abduction): x20 Shoulder Horizontal ABduction: Strengthening;Right;Seated;15 reps Bar Weights/Barbell (Shoulder Horizontal Abduction): 2 lbs Shoulder Retraction: 15 reps Theraband Level (Shoulder Retraction): Level 3 (Green) Shoulder Internal Rotation: PROM;Supine Theraband Level (Shoulder Internal Rotation): Level 3 (Green) (15) Bar Weights/Barbell (Shoulder Internal Rotation): 2 lbs (15) Shoulder External Rotation: PROM;Supine Theraband Level (Shoulder External Rotation): Level 3 (Green) (x15) Bar Weights/Barbell (Shoulder External Rotation): 2 lbs (x15) UBE (Upper Arm Bike): 3' forward 3' reverse 2.5 resistance Shoulder Exercises Shoulder Flexion: PROM;Supine;Strengthening;Right;Seated;15 reps Therapy Ball (Shoulder Flexion): x 20 Bar Weights/Barbell (Shoulder  Flexion): 2 lbs Shoulder Extension: 15 reps Theraband Level (Shoulder Extension): Level 3 (Green) Shoulder Retraction: 15 reps Theraband Level (Shoulder Retraction): Level 3 (Green) Shoulder Protraction: PROM;Supine;Strengthening;Right;15 reps;Seated Bar Weights/Barbell (Shoulder Protraction): 2 lbs Shoulder Horizontal ABduction: Strengthening;Right;Seated;15 reps Bar Weights/Barbell (Shoulder Horizontal Abduction): 2 lbs Shoulder Horizontal ADduction: Strengthening;Right;15 reps;Seated Bar Weights/Barbell (Shoulder Horizontal Adduction): 2 lbs Shoulder ABduction: PROM;Supine;Strengthening;Right;Seated;15 reps Therapy Ball (Shoulder Abduction): x20 Bar Weights/Barbell (Shoulder Abduction): 2 lbs Shoulder External Rotation: PROM;Supine Theraband Level (Shoulder External Rotation): Level 3 (Green) (x15) Bar Weights/Barbell (Shoulder External Rotation): 2 lbs (x15) Shoulder Internal Rotation: PROM;Supine Theraband Level (Shoulder Internal Rotation): Level 3 (Green) (15) Bar Weights/Barbell (Shoulder Internal Rotation): 2 lbs (15) Additional ROM/Strengthening Exercises UBE (Upper Arm Bike): 3' forward 3' reverse 2.5 resistance Cybex Press: 1 plate W10 Cybex Row: 1 plate U72 Wall Wash: 3' "W" Arms:  (x10 and X to V x10) Additional Elbow Exercises UBE (Upper Arm Bike): 3' forward 3' reverse 2.5 resistance Cybex Press: 1 plate Z36 Cybex Row: 1 plate U44 Neurological Re-education Exercises Shoulder Flexion: PROM;Supine;Strengthening;Right;Seated;15 reps Therapy Ball (Shoulder Flexion): x 20 Bar Weights/Barbell (Shoulder Flexion): 2 lbs Shoulder ABduction: PROM;Supine;Strengthening;Right;Seated;15 reps Therapy Ball (Shoulder Abduction): x20 Bar Weights/Barbell (Shoulder Abduction): 2 lbs Shoulder Protraction: PROM;Supine;Strengthening;Right;15 reps;Seated Bar Weights/Barbell (Shoulder Protraction): 2 lbs Shoulder Horizontal ABduction: Strengthening;Right;Seated;15 reps Bar  Weights/Barbell (Shoulder Horizontal Abduction): 2 lbs Shoulder External Rotation: PROM;Supine Theraband Level (Shoulder External Rotation): Level 3 (Green) (x15) Bar Weights/Barbell (Shoulder External Rotation): 2 lbs (x15) Shoulder Internal Rotation: PROM;Supine Theraband Level (Shoulder Internal Rotation): Level 3 (Green) (15) Bar Weights/Barbell (Shoulder Internal Rotation): 2 lbs (15) Work Hardening Exercises UBE (Upper Arm Bike): 3' forward 3' reverse 2.5 resistance Manual Therapy Manual Therapy: Myofascial release (MFR and manuel stretching to right UE and occipital release ) Myofascial Release: 8:35-9:05   Goals Long Term Goals Long Term Goal 1 Progress: Partly met Long Term Goal 3 Progress: Progressing toward goal Long Term Goal 4 Progress: Progressing toward goal Long Term Goal 5 Progress: Progressing toward goal End of  Session Patient Active Problem List  Diagnoses  . Muscle weakness (generalized)  . Cervicalgia  . Pain in joint, shoulder region   End of Session Activity Tolerance: Patient tolerated treatment well OT Assessment and Plan Clinical Impression Statement: Good release felt during MFR in neck and upper trap.  Added green t-band to HEP. Prognosis: Good OT Plan: d/c t-band to HEP   [name] L 11/12/2010, 9:50 AM

## 2010-11-17 ENCOUNTER — Ambulatory Visit (HOSPITAL_COMMUNITY)
Admission: RE | Admit: 2010-11-17 | Discharge: 2010-11-17 | Disposition: A | Discharge status: Home / Self Care | Payer: Medicare Other | Source: Ambulatory Visit | Attending: Internal Medicine | Admitting: Internal Medicine

## 2010-11-17 NOTE — Progress Notes (Signed)
Occupational Therapy Treatment  Patient Name: [name] MRN: [id number] Today's Date: 11/17/2010  Time In 815 Time Out 911 Manual therapy [phone]  Therapeutic Exercise [id number] Visit 11/16  Reassess on 11/27/10        HPI: Symptoms/Limitations Symptoms: I am trying to relax but I can't seem too. Pain Assessment Currently in Pain?: Yes Pain Score:   4 Pain Location: Shoulder Pain Orientation: Right Pain Type: Chronic pain      Exercise/Treatments Cervical Exercises Neck Flexion: 15 reps;AROM;Seated Neck Extension: Seated;AROM;15 reps Neck Lateral Flexion - Right: Seated;AROM;15 reps Neck Lateral Flexion - Left: Seated;AROM;15 reps Neck Rotation - Right: Seated;AROM;15 reps Neck Rotation - Left: Seated;AROM;15 reps Shoulder Flexion: PROM;Strengthening;15 reps;Seated Bar Weights/Barbell (Shoulder Flexion): 2 lbs Therapy Ball (Shoulder Flexion): x 20 Shoulder Extension:  (d/c to hep) Theraband Level (Shoulder Extension):  (d/c to hep) Shoulder ABduction: PROM;Supine;Strengthening;Seated;15 reps Bar Weights/Barbell (Shoulder Abduction): 2 lbs Therapy Ball (Shoulder Abduction): x20 Shoulder Horizontal ABduction: PROM;Supine;Strengthening;15 reps;Seated Bar Weights/Barbell (Shoulder Horizontal Abduction): 2 lbs Shoulder Retraction:  (d/c to hep) Theraband Level (Shoulder Retraction):  (d/c to hep) Row:  (d/c to hep+) Shoulder Internal Rotation: PROM;Supine;Strengthening;15 reps;Seated Theraband Level (Shoulder Internal Rotation):  (d/c to hep) Bar Weights/Barbell (Shoulder Internal Rotation): 2 lbs Shoulder External Rotation: PROM;Supine;Strengthening;15 reps;Seated Bar Weights/Barbell (Shoulder External Rotation): 2 lbs UBE (Upper Arm Bike): 3' forward 3' reverse 3.0 resistance Shoulder Exercises Shoulder Flexion: PROM;Strengthening;15 reps;Seated Therapy Ball (Shoulder Flexion): x 20 Bar Weights/Barbell (Shoulder Flexion): 2 lbs Shoulder Extension:  (d/c to  hep) Theraband Level (Shoulder Extension):  (d/c to hep) Shoulder Retraction:  (d/c to hep) Theraband Level (Shoulder Retraction):  (d/c to hep) Shoulder Protraction: Strengthening;10 reps;Seated Bar Weights/Barbell (Shoulder Protraction): 2 lbs Shoulder Horizontal ABduction: PROM;Supine;Strengthening;15 reps;Seated Bar Weights/Barbell (Shoulder Horizontal Abduction): 2 lbs Shoulder Horizontal ADduction: Strengthening;15 reps;Seated Bar Weights/Barbell (Shoulder Horizontal Adduction): 2 lbs Shoulder ABduction: PROM;Supine;Strengthening;Seated;15 reps Therapy Ball (Shoulder Abduction): x20 Bar Weights/Barbell (Shoulder Abduction): 2 lbs Shoulder External Rotation: PROM;Supine;Strengthening;15 reps;Seated Bar Weights/Barbell (Shoulder External Rotation): 2 lbs Shoulder Internal Rotation: PROM;Supine;Strengthening;15 reps;Seated Theraband Level (Shoulder Internal Rotation):  (d/c to hep) Bar Weights/Barbell (Shoulder Internal Rotation): 2 lbs Row:  (d/c to hep+) Additional ROM/Strengthening Exercises UBE (Upper Arm Bike): 3' forward 3' reverse 3.0 resistance Cybex Press:  (time) Cybex Row: time [employer]: 3' "W" Arms: x 10 x to v times 10 Additional Elbow Exercises UBE (Upper Arm Bike): 3' forward 3' reverse 3.0 resistance Cybex Press:  (time) Cybex Row: time Neurological Re-education Exercises Shoulder Flexion: PROM;Strengthening;15 reps;Seated Therapy Ball (Shoulder Flexion): x 20 Bar Weights/Barbell (Shoulder Flexion): 2 lbs Shoulder ABduction: PROM;Supine;Strengthening;Seated;15 reps Therapy Ball (Shoulder Abduction): x20 Bar Weights/Barbell (Shoulder Abduction): 2 lbs Shoulder Protraction: Strengthening;10 reps;Seated Bar Weights/Barbell (Shoulder Protraction): 2 lbs Shoulder Horizontal ABduction: PROM;Supine;Strengthening;15 reps;Seated Bar Weights/Barbell (Shoulder Horizontal Abduction): 2 lbs Shoulder External Rotation: PROM;Supine;Strengthening;15 reps;Seated Bar  Weights/Barbell (Shoulder External Rotation): 2 lbs Shoulder Internal Rotation: PROM;Supine;Strengthening;15 reps;Seated Theraband Level (Shoulder Internal Rotation):  (d/c to hep) Bar Weights/Barbell (Shoulder Internal Rotation): 2 lbs Work Hardening Exercises UBE (Upper Arm Bike): 3' forward 3' reverse 3.0 resistance Manual Therapy Manual Therapy: Myofascial release Myofascial Release: MFR and manuel sretching to right UE and occipital release to decrease pain and increase pain free ROM  [phone]   Goals Long Term Goals Long Term Goal 2: Decrease pain in right shoulder and neck to 2/10 Long Term Goal 2 Progress: Progressing toward goal Long Term Goal 3: Increase cervical and shoulder AROM to WNL for increased safety when driving Long Term Goal 3  Progress: Progressing toward goal Long Term Goal 4: Decrease right cervical and shoulder fascial restrictions to minimal Long Term Goal 4 Progress: Progressing toward goal Long Term Goal 5: Increase right grip strength by 15 pounds for increased independence opening containers Long Term Goal 5 Progress: Progressing toward goal End of Session Patient Active Problem List  Diagnoses  . Muscle weakness (generalized)  . Cervicalgia  . Pain in joint, shoulder region   End of Session Activity Tolerance: Patient tolerated treatment well OT Assessment and Plan Clinical Impression Statement: d/c'd tband to HEP.   Prognosis: Good OT Plan: add 1# to wall wash   Noralee Stain, Orvie Caradine L 11/17/2010, 11:13 AM

## 2010-11-19 ENCOUNTER — Ambulatory Visit (HOSPITAL_COMMUNITY)
Admission: RE | Admit: 2010-11-19 | Discharge: 2010-11-19 | Disposition: A | Discharge status: Home / Self Care | Payer: Medicare Other | Source: Ambulatory Visit | Attending: Orthopaedic Surgery | Admitting: Orthopaedic Surgery

## 2010-11-19 DIAGNOSIS — E119 Type 2 diabetes mellitus without complications: Secondary | ICD-10-CM | POA: Insufficient documentation

## 2010-11-19 DIAGNOSIS — IMO0001 Reserved for inherently not codable concepts without codable children: Secondary | ICD-10-CM | POA: Insufficient documentation

## 2010-11-19 DIAGNOSIS — M25519 Pain in unspecified shoulder: Secondary | ICD-10-CM | POA: Insufficient documentation

## 2010-11-19 DIAGNOSIS — M6281 Muscle weakness (generalized): Secondary | ICD-10-CM | POA: Insufficient documentation

## 2010-11-19 DIAGNOSIS — M542 Cervicalgia: Secondary | ICD-10-CM | POA: Insufficient documentation

## 2010-11-19 NOTE — Progress Notes (Signed)
Occupational Therapy Treatment  Patient Name: [name] MRN: [id number] Today's Date: 11/19/2010 Time In 805 Time Out 855  Manual therapy [phone]  Therapeutic Exercise [id number]  Visit 12/16  Reassess on 11/27/10   HPI: Symptoms/Limitations Symptoms: S:  My shoulder's better, but I hurt all over this morning.   Pain Assessment Currently in Pain?: Yes Pain Score:   3 Pain Location: Shoulder Pain Orientation: Right  Precautions/Restrictions    Mobility       Exercise/Treatments Cervical Exercises Neck Flexion: Other (comment) (dc to hep) Neck Extension:  (dc to hep) Neck Lateral Flexion - Right:  (dc to hep) Neck Lateral Flexion - Left:  (dc to hep) Neck Rotation - Right:  (dc to hep) Neck Rotation - Left:  (dc to hep) Shoulder Flexion: PROM;Strengthening;15 reps;Seated Bar Weights/Barbell (Shoulder Flexion): 2 lbs Therapy Ball (Shoulder Flexion): x 20 Shoulder ABduction: PROM;Supine;Strengthening;Seated;15 reps Bar Weights/Barbell (Shoulder Abduction): 2 lbs Therapy Ball (Shoulder Abduction): x20 and right left x 5 reps each direction Shoulder Horizontal ABduction: PROM;Supine;Strengthening;15 reps;Seated Shoulder Internal Rotation: PROM;Supine;Strengthening;15 reps;Seated Bar Weights/Barbell (Shoulder Internal Rotation): 2 lbs Shoulder External Rotation: PROM;Supine;Strengthening;15 reps;Seated Bar Weights/Barbell (Shoulder External Rotation): 2 lbs UBE (Upper Arm Bike): 3' forward 3' reverse 3.0 resistance Shoulder Exercises Shoulder Flexion: PROM;Strengthening;15 reps;Seated Therapy Ball (Shoulder Flexion): x 20 Bar Weights/Barbell (Shoulder Flexion): 2 lbs Shoulder Protraction: Supine;PROM;10 reps;Seated;Strengthening;15 reps Bar Weights/Barbell (Shoulder Protraction): 2 lbs Shoulder Horizontal ABduction: PROM;Supine;Strengthening;15 reps;Seated Shoulder ABduction: PROM;Supine;Strengthening;Seated;15 reps Therapy Ball (Shoulder Abduction): x20 and right left x  5 reps each direction Bar Weights/Barbell (Shoulder Abduction): 2 lbs Shoulder External Rotation: PROM;Supine;Strengthening;15 reps;Seated Bar Weights/Barbell (Shoulder External Rotation): 2 lbs Shoulder Internal Rotation: PROM;Supine;Strengthening;15 reps;Seated Bar Weights/Barbell (Shoulder Internal Rotation): 2 lbs Additional ROM/Strengthening Exercises UBE (Upper Arm Bike): 3' forward 3' reverse 3.0 resistance Cybex Press: 1 plate x 15 Cybex Row: 1 plate x 15 Wall Wash: 2' with 1# "W" Arms: 2 pounds x 10 reps and x to v 10 reps with 2 pounds Additional Elbow Exercises UBE (Upper Arm Bike): 3' forward 3' reverse 3.0 resistance Cybex Press: 1 plate x 15 Cybex Row: 1 plate x 15 Neurological Re-education Exercises Shoulder Flexion: PROM;Strengthening;15 reps;Seated Therapy Ball (Shoulder Flexion): x 20 Bar Weights/Barbell (Shoulder Flexion): 2 lbs Shoulder ABduction: PROM;Supine;Strengthening;Seated;15 reps Therapy Ball (Shoulder Abduction): x20 and right left x 5 reps each direction Bar Weights/Barbell (Shoulder Abduction): 2 lbs Shoulder Protraction: Supine;PROM;10 reps;Seated;Strengthening;15 reps Bar Weights/Barbell (Shoulder Protraction): 2 lbs Shoulder Horizontal ABduction: PROM;Supine;Strengthening;15 reps;Seated Shoulder External Rotation: PROM;Supine;Strengthening;15 reps;Seated Bar Weights/Barbell (Shoulder External Rotation): 2 lbs Shoulder Internal Rotation: PROM;Supine;Strengthening;15 reps;Seated Bar Weights/Barbell (Shoulder Internal Rotation): 2 lbs Work Hardening Exercises UBE (Upper Arm Bike): 3' forward 3' reverse 3.0 resistance Manual Therapy Manual Therapy: Myofascial release Myofascial Release: MFR and manual stretchng to right shoulder, upper arm, and scapular region.  Suboccipital release to decrease pain in neck.  [phone] Exercises completed one time only.   Goals Long Term Goals Long Term Goal 1 Progress: Progressing toward goal Long Term Goal 2  Progress: Progressing toward goal Long Term Goal 3 Progress: Progressing toward goal Long Term Goal 4 Progress: Progressing toward goal Long Term Goal 5 Progress: Progressing toward goal End of Session Patient Active Problem List  Diagnoses  . Muscle weakness (generalized)  . Cervicalgia  . Pain in joint, shoulder region   End of Session Activity Tolerance: Patient tolerated treatment well General Behavior During Session: [facility] for tasks performed Cognition: [facility] for tasks performed OT Assessment and Plan Clinical Impression Statement: A: DC neck AROM  to HEP.  Added 1 pound to wall wash.  Increased to 15 reps with cybex press and row. OT Plan: P:  Increase to 3 pounds with strengthening exercises.  Increase to 1.5 plates on cybex.   Sondra Barges [place] 11/19/2010, 8:51 AM

## 2010-11-24 ENCOUNTER — Ambulatory Visit (HOSPITAL_COMMUNITY)
Admission: RE | Admit: 2010-11-24 | Discharge: 2010-11-24 | Disposition: A | Discharge status: Home / Self Care | Payer: Medicare Other | Source: Ambulatory Visit | Attending: Orthopaedic Surgery | Admitting: Orthopaedic Surgery

## 2010-11-24 NOTE — Progress Notes (Signed)
Occupational Therapy Treatment  Patient Name: [name] MRN: [id number] Today's Date: 11/24/2010 Time In 806 Time Out 844  Manual therapy [phone] Therapeutic Exercise [id number] Visit 13/16  Reassess on 11/27/10   HPI: Symptoms/Limitations Symptoms: S:  Im not in any pain today, just a bit sore. Pain Assessment Currently in Pain?: No/denies      Exercise/Treatments Shoulder Exercises Shoulder Flexion: Supine;PROM;Strengthening;Seated (3 pounds 10 reps (sup and sit)) Therapy Ball (Shoulder Flexion): dc Shoulder Protraction: Supine;PROM;Strengthening;10 reps;Seated (strengthening 3 pounds x 10 reps sup and seated) Shoulder Horizontal ABduction: Supine;PROM;Strengthening;Seated (10 reps with 3 pounds both sup and sit) Shoulder ABduction: Supine;PROM;Strengthening;Seated (10 reps x 3 pounds both sup and sit) Therapy Ball (Shoulder Abduction): dc Shoulder External Rotation: Supine;PROM;Strengthening;Seated (10 reps with 3 pounds both sup and sit) Shoulder Internal Rotation: Supine;PROM;Strengthening;Seated;10 reps (3 pounds both sup and sit) Additional ROM/Strengthening Exercises UBE (Upper Arm Bike): 3' forward and 3' reverse 3.5 resistance Cybex Press: 1.5 plates x 10 Cybex Row: 1.5 plates x 10 Wall Wash: 3 min with 1# "W" Arms: 3 pounds x 10 reps also x to v 3 pounds x 10 reps Manual Therapy Manual Therapy: Myofascial release Myofascial Release: MFR and manual stretching to right shoulder, upper arm, and scapular region to decrease pain in scapular region.  2 main restrictions noted in her rhomboid and supraspinatus region.   Goals Long Term Goals Long Term Goal 1 Progress: Progressing toward goal Long Term Goal 2 Progress: Progressing toward goal Long Term Goal 3 Progress: Progressing toward goal Long Term Goal 4 Progress: Progressing toward goal Long Term Goal 5 Progress: Progressing toward goal End of Session Patient Active Problem List  Diagnoses  . Muscle weakness  (generalized)  . Cervicalgia  . Pain in joint, shoulder region   End of Session Activity Tolerance: Patient tolerated treatment well General Behavior During Session: [facility] for tasks performed OT Assessment and Plan Clinical Impression Statement: A:  Increased to 3 pounds with strengthening exercises in sup and sit.  Increased to 3 min with wall wash.  Increased to 3.5 on UBE and 1.5 on cybex.   OT Plan: P:  Increase to 12 reps with strengthening, Increase to 2 pounds with wall wash.  REASSESS.   Sondra Barges [place] 11/24/2010, 8:45 AM

## 2010-11-26 ENCOUNTER — Ambulatory Visit (HOSPITAL_COMMUNITY)
Admission: RE | Admit: 2010-11-26 | Discharge: 2010-11-26 | Disposition: A | Discharge status: Home / Self Care | Payer: Medicare Other | Source: Ambulatory Visit | Attending: Specialist | Admitting: Specialist

## 2010-11-26 NOTE — Progress Notes (Signed)
Occupational Therapy Treatment  Patient Name: [name] MRN: [id number] Today's Date: 11/26/2010 Time In 806 Time Out 840 Manual therapy [id number] Reassessment [phone] Visit 14/16  Reassessed today   S:  I can turn my head without turning my body when I am driving now.   Pain Assessment Pain Score:   1 Pain Location: Shoulder Pain Orientation: Right           Exercise/Treatments Shoulder Exercises Shoulder Flexion: Supine;PROM;Strengthening (3 pounds) Shoulder Protraction: Supine;PROM;10 reps;Strengthening (3 pounds 12 reps) Shoulder Horizontal ABduction: Supine;PROM;10 reps;Strengthening (3 poudns 12 reps) Shoulder ABduction: Supine;PROM;10 reps;Strengthening (3 pounds 12 reps) Shoulder External Rotation: Supine;PROM;10 reps;Strengthening (12 reps 3 pounds) Shoulder Internal Rotation: Supine;PROM;10 reps;Strengthening (12 reps 3 pounds) Manual Therapy Manual Therapy: Myofascial release Myofascial Release: MFR and manual stretching to right shoulder, upper arm, and scapular region to decrease pain in scapular region.  No restrictions noted.   Goals Long Term Goals Long Term Goal 1 Progress: Met Long Term Goal 2 Progress: Met Long Term Goal 3 Progress: Met Long Term Goal 4 Progress: Met Long Term Goal 5 Progress: Met End of Session Patient Active Problem List  Diagnoses  . Muscle weakness (generalized)  . Cervicalgia  . Pain in joint, shoulder region   End of Session Activity Tolerance: Patient tolerated treatment well General Behavior During Session: [facility] for tasks performed Cognition: [facility] for tasks performed OT Assessment and Plan Clinical Impression Statement: A:  cervical range of motion WFL except for lateral flexion right and left, which are 75%.  shoulder flexion 145 5/5, abd 148 5/5, ext rot 80 5/5, int rot 72 5/5, grip strength is 60 pounds.  Patient has met all STG and LTG.   OT Plan: DC with HEP for continued cervical stretches and shoulder  strengthening exercises.     Shirlean Mylar, OTR/L  11/26/2010, 8:53 AM

## 2010-12-03 ENCOUNTER — Ambulatory Visit (HOSPITAL_COMMUNITY): Payer: Medicare Other | Admitting: Specialist

## 2011-05-19 DIAGNOSIS — H2510 Age-related nuclear cataract, unspecified eye: Secondary | ICD-10-CM | POA: Diagnosis not present

## 2011-06-14 DIAGNOSIS — E119 Type 2 diabetes mellitus without complications: Secondary | ICD-10-CM | POA: Diagnosis not present

## 2011-06-21 DIAGNOSIS — IMO0001 Reserved for inherently not codable concepts without codable children: Secondary | ICD-10-CM | POA: Diagnosis not present

## 2011-07-09 DIAGNOSIS — M25559 Pain in unspecified hip: Secondary | ICD-10-CM | POA: Diagnosis not present

## 2011-08-04 DIAGNOSIS — M25579 Pain in unspecified ankle and joints of unspecified foot: Secondary | ICD-10-CM | POA: Diagnosis not present

## 2011-08-04 DIAGNOSIS — M25559 Pain in unspecified hip: Secondary | ICD-10-CM | POA: Diagnosis not present

## 2011-08-04 DIAGNOSIS — M109 Gout, unspecified: Secondary | ICD-10-CM | POA: Diagnosis not present

## 2011-08-18 DIAGNOSIS — M2550 Pain in unspecified joint: Secondary | ICD-10-CM | POA: Diagnosis not present

## 2011-09-15 DIAGNOSIS — E119 Type 2 diabetes mellitus without complications: Secondary | ICD-10-CM | POA: Diagnosis not present

## 2011-09-20 DIAGNOSIS — M25519 Pain in unspecified shoulder: Secondary | ICD-10-CM | POA: Diagnosis not present

## 2011-09-22 DIAGNOSIS — E119 Type 2 diabetes mellitus without complications: Secondary | ICD-10-CM | POA: Diagnosis not present

## 2011-09-22 DIAGNOSIS — I10 Essential (primary) hypertension: Secondary | ICD-10-CM | POA: Diagnosis not present

## 2011-10-14 DIAGNOSIS — M79609 Pain in unspecified limb: Secondary | ICD-10-CM | POA: Diagnosis not present

## 2011-10-14 DIAGNOSIS — L03119 Cellulitis of unspecified part of limb: Secondary | ICD-10-CM | POA: Diagnosis not present

## 2011-10-14 DIAGNOSIS — L02619 Cutaneous abscess of unspecified foot: Secondary | ICD-10-CM | POA: Diagnosis not present

## 2011-10-14 DIAGNOSIS — L97509 Non-pressure chronic ulcer of other part of unspecified foot with unspecified severity: Secondary | ICD-10-CM | POA: Diagnosis not present

## 2011-10-29 DIAGNOSIS — L570 Actinic keratosis: Secondary | ICD-10-CM | POA: Diagnosis not present

## 2011-10-29 DIAGNOSIS — L821 Other seborrheic keratosis: Secondary | ICD-10-CM | POA: Diagnosis not present

## 2011-10-29 DIAGNOSIS — D235 Other benign neoplasm of skin of trunk: Secondary | ICD-10-CM | POA: Diagnosis not present

## 2011-10-29 DIAGNOSIS — L280 Lichen simplex chronicus: Secondary | ICD-10-CM | POA: Diagnosis not present

## 2011-12-27 DIAGNOSIS — N390 Urinary tract infection, site not specified: Secondary | ICD-10-CM | POA: Diagnosis not present

## 2012-01-04 DIAGNOSIS — H26019 Infantile and juvenile cortical, lamellar, or zonular cataract, unspecified eye: Secondary | ICD-10-CM | POA: Diagnosis not present

## 2012-01-04 DIAGNOSIS — H2510 Age-related nuclear cataract, unspecified eye: Secondary | ICD-10-CM | POA: Diagnosis not present

## 2012-01-04 DIAGNOSIS — N390 Urinary tract infection, site not specified: Secondary | ICD-10-CM | POA: Diagnosis not present

## 2012-01-19 DIAGNOSIS — E119 Type 2 diabetes mellitus without complications: Secondary | ICD-10-CM | POA: Diagnosis not present

## 2012-01-24 ENCOUNTER — Encounter (HOSPITAL_COMMUNITY): Payer: Self-pay

## 2012-01-26 DIAGNOSIS — I10 Essential (primary) hypertension: Secondary | ICD-10-CM | POA: Diagnosis not present

## 2012-01-26 DIAGNOSIS — E119 Type 2 diabetes mellitus without complications: Secondary | ICD-10-CM | POA: Diagnosis not present

## 2012-01-28 DIAGNOSIS — N952 Postmenopausal atrophic vaginitis: Secondary | ICD-10-CM | POA: Diagnosis not present

## 2012-01-28 DIAGNOSIS — R319 Hematuria, unspecified: Secondary | ICD-10-CM | POA: Diagnosis not present

## 2012-01-31 ENCOUNTER — Encounter (HOSPITAL_COMMUNITY)
Admission: RE | Admit: 2012-01-31 | Discharge: 2012-01-31 | Disposition: A | Discharge status: Home / Self Care | Payer: Medicare Other | Source: Ambulatory Visit | Attending: Ophthalmology | Admitting: Ophthalmology

## 2012-01-31 ENCOUNTER — Encounter (HOSPITAL_COMMUNITY): Payer: Self-pay

## 2012-01-31 ENCOUNTER — Other Ambulatory Visit: Payer: Self-pay

## 2012-01-31 DIAGNOSIS — Z01812 Encounter for preprocedural laboratory examination: Secondary | ICD-10-CM | POA: Diagnosis not present

## 2012-01-31 DIAGNOSIS — Z794 Long term (current) use of insulin: Secondary | ICD-10-CM | POA: Diagnosis not present

## 2012-01-31 DIAGNOSIS — Z01810 Encounter for preprocedural cardiovascular examination: Secondary | ICD-10-CM | POA: Diagnosis not present

## 2012-01-31 DIAGNOSIS — H2510 Age-related nuclear cataract, unspecified eye: Secondary | ICD-10-CM | POA: Diagnosis not present

## 2012-01-31 DIAGNOSIS — E119 Type 2 diabetes mellitus without complications: Secondary | ICD-10-CM | POA: Diagnosis not present

## 2012-01-31 DIAGNOSIS — I10 Essential (primary) hypertension: Secondary | ICD-10-CM | POA: Diagnosis not present

## 2012-01-31 HISTORY — DX: Other specified postprocedural states: R11.2

## 2012-01-31 HISTORY — DX: Other specified postprocedural states: Z98.890

## 2012-01-31 HISTORY — DX: Unspecified osteoarthritis, unspecified site: M19.90

## 2012-01-31 HISTORY — DX: Nausea with vomiting, unspecified: R11.2

## 2012-01-31 HISTORY — DX: Type 2 diabetes mellitus without complications: E11.9

## 2012-01-31 HISTORY — DX: Essential (primary) hypertension: I10

## 2012-01-31 LAB — BASIC METABOLIC PANEL
BUN: 11 mg/dL (ref 6–23)
CO2: 27 mEq/L (ref 19–32)
Calcium: 9.8 mg/dL (ref 8.4–10.5)
Creatinine, Ser: 0.95 mg/dL (ref 0.50–1.10)
GFR calc non Af Amer: 57 mL/min — ABNORMAL LOW (ref >90)
Glucose, Bld: 205 mg/dL — ABNORMAL HIGH (ref 70–99)

## 2012-01-31 NOTE — Patient Instructions (Addendum)
Your procedure is scheduled on:  Tuesday, 02/08/12  Report to [facility] at   0700     AM.  Call this number if you have problems the morning of surgery: [phone]   Remember:   Do not eat or drink   After Midnight.  Take these medicines the morning of surgery with A SIP OF WATER: Hyzaar (losartan-HCTZ), and 1/2 of your usual dose of Lantus Tuesday morning before surgery. No Metformin the day of surgery.   Do not wear jewelry, make-up or nail polish.  Do not wear lotions, powders, or perfumes. You may wear deodorant.  Do not bring valuables to the hospital.  Contacts, dentures or bridgework may not be worn into surgery.     Patients discharged the day of surgery will not be allowed to drive home.  Name and phone number of your driver: driver  Special Instructions: Use eye drops as directed.   Please read over the following fact sheets that you were given: Pain Booklet, Anesthesia Post-op Instructions and Care and Recovery After Surgery    Cataract Surgery  A cataract is a clouding of the lens of the eye. When a lens becomes cloudy, vision is reduced based on the degree and nature of the clouding. Surgery may be needed to improve vision. Surgery removes the cloudy lens and usually replaces it with a substitute lens (intraocular lens, IOL). LET YOUR EYE DOCTOR KNOW ABOUT:  Allergies to food or medicine.   Medicines taken including herbs, eyedrops, over-the-counter medicines, and creams.   Use of steroids (by mouth or creams).   Previous problems with anesthetics or numbing medicine.   History of bleeding problems or blood clots.   Previous surgery.   Other health problems, including diabetes and kidney problems.   Possibility of pregnancy, if this applies.  RISKS AND COMPLICATIONS  Infection.   Inflammation of the eyeball (endophthalmitis) that can spread to both eyes (sympathetic ophthalmia).   Poor wound healing.   If an IOL is inserted, it can later fall out of proper  position. This is very uncommon.   Clouding of the part of your eye that holds an IOL in place. This is called an "after-cataract." These are uncommon, but easily treated.  BEFORE THE PROCEDURE  Do not eat or drink anything except small amounts of water for 8 to 12 before your surgery, or as directed by your caregiver.   Unless you are told otherwise, continue any eyedrops you have been prescribed.   Talk to your primary caregiver about all other medicines that you take (both prescription and non-prescription). In some cases, you may need to stop or change medicines near the time of your surgery. This is most important if you are taking blood-thinning medicine.Do not stop medicines unless you are told to do so.   Arrange for someone to drive you to and from the procedure.   Do not put contact lenses in either eye on the day of your surgery.  PROCEDURE There is more than one method for safely removing a cataract. Your doctor can explain the differences and help determine which is best for you. Phacoemulsification surgery is the most common form of cataract surgery.  An injection is given behind the eye or eyedrops are given to make this a painless procedure.   A small cut (incision) is made on the edge of the clear, dome-shaped surface that covers the front of the eye (cornea).   A tiny probe is painlessly inserted into the eye. This  device gives off ultrasound waves that soften and break up the cloudy center of the lens. This makes it easier for the cloudy lens to be removed by suction.   An IOL may be implanted.   The normal lens of the eye is covered by a clear capsule. Part of that capsule is intentionally left in the eye to support the IOL.   Your surgeon may or may not use stitches to close the incision.  There are other forms of cataract surgery that require a larger incision and stiches to close the eye. This approach is taken in cases where the doctor feels that the cataract  cannot be easily removed using phacoemulsification. AFTER THE PROCEDURE  When an IOL is implanted, it does not need care. It becomes a permanent part of your eye and cannot be seen or felt.   Your doctor will schedule follow-up exams to check on your progress.   Review your other medicines with your doctor to see which can be resumed after surgery.   Use eyedrops or take medicine as prescribed by your doctor.  Document Released: 03/25/2011 Document Reviewed: 03/22/2011 [facility] [place] [facility] Patient Information 2012 [place], [place].  PATIENT INSTRUCTIONS POST-ANESTHESIA  IMMEDIATELY FOLLOWING SURGERY:  Do not drive or operate machinery for the first twenty four hours after surgery.  Do not make any important decisions for twenty four hours after surgery or while taking narcotic pain medications or sedatives.  If you develop intractable nausea and vomiting or a severe headache please notify your doctor immediately.  FOLLOW-UP:  Please make an appointment with your surgeon as instructed. You do not need to follow up with anesthesia unless specifically instructed to do so.  WOUND CARE INSTRUCTIONS (if applicable):  Keep a dry clean dressing on the anesthesia/puncture wound site if there is drainage.  Once the wound has quit draining you may leave it open to air.  Generally you should leave the bandage intact for twenty four hours unless there is drainage.  If the epidural site drains for more than 36-48 hours please call the anesthesia department.  QUESTIONS?:  Please feel free to call your physician or the hospital operator if you have any questions, and they will be happy to assist you.

## 2012-02-07 MED ORDER — CYCLOPENTOLATE-PHENYLEPHRINE 0.2-1 % OP SOLN
OPHTHALMIC | Status: AC
  Filled 2012-02-07: qty 2

## 2012-02-07 MED ORDER — PHENYLEPHRINE HCL 2.5 % OP SOLN
OPHTHALMIC | Status: AC
  Filled 2012-02-07: qty 2

## 2012-02-07 MED ORDER — TETRACAINE HCL 0.5 % OP SOLN
OPHTHALMIC | Status: AC
  Filled 2012-02-07: qty 2

## 2012-02-07 MED ORDER — FLURBIPROFEN SODIUM 0.03 % OP SOLN
OPHTHALMIC | Status: AC
  Filled 2012-02-07: qty 2.5

## 2012-02-08 ENCOUNTER — Ambulatory Visit (HOSPITAL_COMMUNITY)
Admission: RE | Admit: 2012-02-08 | Discharge: 2012-02-08 | Disposition: A | Discharge status: Home / Self Care | Payer: Medicare Other | Source: Ambulatory Visit | Attending: Ophthalmology | Admitting: Ophthalmology

## 2012-02-08 ENCOUNTER — Encounter (HOSPITAL_COMMUNITY): Payer: Self-pay | Admitting: Anesthesiology

## 2012-02-08 ENCOUNTER — Encounter (HOSPITAL_COMMUNITY)
Admission: RE | Disposition: A | Discharge status: Home / Self Care | Payer: Self-pay | Source: Ambulatory Visit | Attending: Ophthalmology

## 2012-02-08 ENCOUNTER — Encounter (HOSPITAL_COMMUNITY): Payer: Self-pay | Admitting: *Deleted

## 2012-02-08 ENCOUNTER — Ambulatory Visit (HOSPITAL_COMMUNITY): Payer: Medicare Other | Admitting: Anesthesiology

## 2012-02-08 DIAGNOSIS — Z01810 Encounter for preprocedural cardiovascular examination: Secondary | ICD-10-CM | POA: Insufficient documentation

## 2012-02-08 DIAGNOSIS — I10 Essential (primary) hypertension: Secondary | ICD-10-CM | POA: Insufficient documentation

## 2012-02-08 DIAGNOSIS — H26019 Infantile and juvenile cortical, lamellar, or zonular cataract, unspecified eye: Secondary | ICD-10-CM | POA: Diagnosis not present

## 2012-02-08 DIAGNOSIS — E119 Type 2 diabetes mellitus without complications: Secondary | ICD-10-CM | POA: Diagnosis not present

## 2012-02-08 DIAGNOSIS — Z01812 Encounter for preprocedural laboratory examination: Secondary | ICD-10-CM | POA: Diagnosis not present

## 2012-02-08 DIAGNOSIS — H269 Unspecified cataract: Secondary | ICD-10-CM | POA: Diagnosis not present

## 2012-02-08 DIAGNOSIS — H2510 Age-related nuclear cataract, unspecified eye: Secondary | ICD-10-CM | POA: Diagnosis not present

## 2012-02-08 DIAGNOSIS — Z794 Long term (current) use of insulin: Secondary | ICD-10-CM | POA: Insufficient documentation

## 2012-02-08 HISTORY — PX: CATARACT EXTRACTION W/PHACO: SHX586

## 2012-02-08 LAB — GLUCOSE, CAPILLARY: Glucose-Capillary: 115 mg/dL — ABNORMAL HIGH (ref 70–99)

## 2012-02-08 SURGERY — PHACOEMULSIFICATION, CATARACT, WITH IOL INSERTION
Anesthesia: Monitor Anesthesia Care | Site: Eye | Laterality: Left | Wound class: Clean

## 2012-02-08 MED ORDER — PROVISC 10 MG/ML IO SOLN
INTRAOCULAR | Status: DC | PRN
  Administered 2012-02-08: 8.5 mg via INTRAOCULAR

## 2012-02-08 MED ORDER — FENTANYL CITRATE 0.05 MG/ML IJ SOLN: 25.0000 ug | INTRAMUSCULAR | Status: DC | PRN

## 2012-02-08 MED ORDER — EPINEPHRINE HCL 1 MG/ML IJ SOLN
INTRAMUSCULAR | Status: AC
  Filled 2012-02-08: qty 1

## 2012-02-08 MED ORDER — CYCLOPENTOLATE HCL 1 % OP SOLN
1.0000 [drp] | OPHTHALMIC | Status: AC
  Administered 2012-02-08 (×3): 1 [drp] via OPHTHALMIC

## 2012-02-08 MED ORDER — PHENYLEPHRINE HCL 2.5 % OP SOLN
1.0000 [drp] | OPHTHALMIC | Status: AC
  Administered 2012-02-08 (×3): 1 [drp] via OPHTHALMIC

## 2012-02-08 MED ORDER — LACTATED RINGERS IV SOLN
INTRAVENOUS | Status: DC
  Administered 2012-02-08: 08:00:00 via INTRAVENOUS

## 2012-02-08 MED ORDER — MIDAZOLAM HCL 2 MG/2ML IJ SOLN
1.0000 mg | INTRAMUSCULAR | Status: DC | PRN
  Administered 2012-02-08: 2 mg via INTRAVENOUS

## 2012-02-08 MED ORDER — EPINEPHRINE HCL 1 MG/ML IJ SOLN
INTRAOCULAR | Status: DC | PRN
  Administered 2012-02-08: 09:00:00

## 2012-02-08 MED ORDER — FLURBIPROFEN SODIUM 0.03 % OP SOLN
1.0000 [drp] | OPHTHALMIC | Status: AC
  Administered 2012-02-08 (×3): 1 [drp] via OPHTHALMIC

## 2012-02-08 MED ORDER — TETRACAINE HCL 0.5 % OP SOLN
1.0000 [drp] | OPHTHALMIC | Status: AC
  Administered 2012-02-08 (×3): 1 [drp] via OPHTHALMIC

## 2012-02-08 MED ORDER — ONDANSETRON HCL 4 MG/2ML IJ SOLN: 4.0000 mg | Freq: Once | INTRAMUSCULAR | Status: DC | PRN

## 2012-02-08 MED ORDER — BSS IO SOLN
INTRAOCULAR | Status: DC | PRN
  Administered 2012-02-08: 15 mL via INTRAOCULAR

## 2012-02-08 MED ORDER — MIDAZOLAM HCL 2 MG/2ML IJ SOLN
INTRAMUSCULAR | Status: AC
  Filled 2012-02-08: qty 2

## 2012-02-08 SURGICAL SUPPLY — 23 items
CAPSULAR TENSION RING-AMO (OPHTHALMIC RELATED) IMPLANT
CLOTH BEACON ORANGE TIMEOUT ST (SAFETY) ×2 IMPLANT
EYE SHIELD UNIVERSAL CLEAR (GAUZE/BANDAGES/DRESSINGS) ×2 IMPLANT
GLOVE BIO SURGEON STRL SZ 6.5 (GLOVE) ×2 IMPLANT
GLOVE ECLIPSE 6.5 STRL STRAW (GLOVE) IMPLANT
GLOVE ECLIPSE 7.0 STRL STRAW (GLOVE) IMPLANT
GLOVE EXAM NITRILE LRG STRL (GLOVE) IMPLANT
GLOVE EXAM NITRILE MD LF STRL (GLOVE) ×2 IMPLANT
GLOVE SKINSENSE NS SZ6.5 (GLOVE)
GLOVE SKINSENSE STRL SZ6.5 (GLOVE) IMPLANT
HEALON 5 0.6 ML (INTRAOCULAR LENS) IMPLANT
KIT VITRECTOMY (OPHTHALMIC RELATED) IMPLANT
PAD ARMBOARD 7.5X6 YLW CONV (MISCELLANEOUS) ×2 IMPLANT
PROC W NO LENS (INTRAOCULAR LENS)
PROC W SPEC LENS (INTRAOCULAR LENS)
PROCESS W NO LENS (INTRAOCULAR LENS) IMPLANT
PROCESS W SPEC LENS (INTRAOCULAR LENS) IMPLANT
RING MALYGIN (MISCELLANEOUS) IMPLANT
SIGHTPATH CAT PROC W REG LENS (Ophthalmic Related) ×2 IMPLANT
TAPE SURG TRANSPORE 1 IN (GAUZE/BANDAGES/DRESSINGS) ×1 IMPLANT
TAPE SURGICAL TRANSPORE 1 IN (GAUZE/BANDAGES/DRESSINGS) ×1
VISCOELASTIC ADDITIONAL (OPHTHALMIC RELATED) IMPLANT
WATER STERILE IRR 250ML POUR (IV SOLUTION) ×2 IMPLANT

## 2012-02-08 NOTE — H&P (Signed)
The patient was re examined and there is no change in the patients condition since the original H and P. 

## 2012-02-08 NOTE — Anesthesia Postprocedure Evaluation (Signed)
  Anesthesia Post-op Note  Patient: [name]  Procedure(s) Performed: Procedure(s) (LRB) with comments: CATARACT EXTRACTION PHACO AND INTRAOCULAR LENS PLACEMENT (IOC) (Left) - CDE:11.62  Patient Location: PACU and Short Stay  Anesthesia Type: MAC  Level of Consciousness: awake, alert  and oriented  Airway and Oxygen Therapy: Patient Spontanous Breathing  Post-op Pain: none  Post-op Assessment: Post-op Vital signs reviewed, Patient's Cardiovascular Status Stable, Respiratory Function Stable, Patent Airway and No signs of Nausea or vomiting  Post-op Vital Signs: Reviewed and stable  Complications: No apparent anesthesia complications

## 2012-02-08 NOTE — Anesthesia Preprocedure Evaluation (Signed)
Anesthesia Evaluation  Patient identified by MRN, date of birth, ID band Patient awake    Reviewed: Allergy & Precautions, H&P , NPO status , Patient's Chart, lab work & pertinent test results  History of Anesthesia Complications (+) PONV  Airway Mallampati: II      Dental  (+) Edentulous Upper and Edentulous Lower   Pulmonary  breath sounds clear to auscultation        Cardiovascular hypertension, Pt. on medications Rhythm:Regular Rate:Normal     Neuro/Psych    GI/Hepatic   Endo/Other  diabetes, Well Controlled, Type 2, Insulin Dependent  Renal/GU      Musculoskeletal   Abdominal   Peds  Hematology   Anesthesia Other Findings   Reproductive/Obstetrics                           Anesthesia Physical Anesthesia Plan  ASA: III  Anesthesia Plan: MAC   Post-op Pain Management:    Induction: Intravenous  Airway Management Planned: Nasal Cannula  Additional Equipment:   Intra-op Plan:   Post-operative Plan:   Informed Consent: I have reviewed the patients History and Physical, chart, labs and discussed the procedure including the risks, benefits and alternatives for the proposed anesthesia with the patient or authorized representative who has indicated his/her understanding and acceptance.     Plan Discussed with:   Anesthesia Plan Comments:         Anesthesia Quick Evaluation

## 2012-02-08 NOTE — Op Note (Signed)
Patient brought to the operating room and prepped and draped in the usual manner.  Lid speculum inserted in left eye.  Stab incision made at the twelve o'clock position.  Provisc instilled in the anterior chamber.   A 2.4 mm. Stab incision was made temporally.  An anterior capsulotomy was done with a bent 25 gauge needle.  The nucleus was hydrodissected.  The Phaco tip was inserted in the anterior chamber and the nucleus was emulsified.  CDE was 11.62.  The cortical material was then removed with the I and A tip.  Posterior capsule was the polished.  The anterior chamber was deepened with Provisc.  A 21.5 Diopter Rayner 570C IOL was then inserted in the capsular bag.  Provisc was then removed with the I and A tip.  The wound was then hydrated.  Patient sent to the Recovery Room in good condition with follow up in my office.

## 2012-02-08 NOTE — Transfer of Care (Signed)
Immediate Anesthesia Transfer of Care Note  Patient: [name]  Procedure(s) Performed: Procedure(s) (LRB) with comments: CATARACT EXTRACTION PHACO AND INTRAOCULAR LENS PLACEMENT (IOC) (Left) - CDE:11.62  Patient Location: PACU and Short Stay  Anesthesia Type: MAC  Level of Consciousness: awake  Airway & Oxygen Therapy: Patient Spontanous Breathing  Post-op Assessment: Report given to PACU RN  Post vital signs: Reviewed  Complications: No apparent anesthesia complications

## 2012-02-10 ENCOUNTER — Encounter (HOSPITAL_COMMUNITY): Payer: Self-pay | Admitting: Ophthalmology

## 2012-03-24 DIAGNOSIS — N952 Postmenopausal atrophic vaginitis: Secondary | ICD-10-CM | POA: Diagnosis not present

## 2012-03-24 DIAGNOSIS — N390 Urinary tract infection, site not specified: Secondary | ICD-10-CM | POA: Diagnosis not present

## 2012-05-24 DIAGNOSIS — E119 Type 2 diabetes mellitus without complications: Secondary | ICD-10-CM | POA: Diagnosis not present

## 2012-05-24 DIAGNOSIS — E785 Hyperlipidemia, unspecified: Secondary | ICD-10-CM | POA: Diagnosis not present

## 2012-05-24 DIAGNOSIS — I10 Essential (primary) hypertension: Secondary | ICD-10-CM | POA: Diagnosis not present

## 2012-05-24 DIAGNOSIS — Z79899 Other long term (current) drug therapy: Secondary | ICD-10-CM | POA: Diagnosis not present

## 2012-05-31 DIAGNOSIS — E119 Type 2 diabetes mellitus without complications: Secondary | ICD-10-CM | POA: Diagnosis not present

## 2012-05-31 DIAGNOSIS — I10 Essential (primary) hypertension: Secondary | ICD-10-CM | POA: Diagnosis not present

## 2012-06-16 DIAGNOSIS — I10 Essential (primary) hypertension: Secondary | ICD-10-CM | POA: Diagnosis not present

## 2012-06-16 DIAGNOSIS — M25519 Pain in unspecified shoulder: Secondary | ICD-10-CM | POA: Diagnosis not present

## 2012-06-29 DIAGNOSIS — L570 Actinic keratosis: Secondary | ICD-10-CM | POA: Diagnosis not present

## 2012-06-29 DIAGNOSIS — D0460 Carcinoma in situ of skin of unspecified upper limb, including shoulder: Secondary | ICD-10-CM | POA: Diagnosis not present

## 2012-07-14 DIAGNOSIS — I10 Essential (primary) hypertension: Secondary | ICD-10-CM | POA: Diagnosis not present

## 2012-07-14 DIAGNOSIS — M25519 Pain in unspecified shoulder: Secondary | ICD-10-CM | POA: Diagnosis not present

## 2012-07-18 DIAGNOSIS — H353190 Nonexudative age-related macular degeneration, unspecified eye, stage unspecified: Secondary | ICD-10-CM | POA: Diagnosis not present

## 2012-07-18 DIAGNOSIS — Z961 Presence of intraocular lens: Secondary | ICD-10-CM | POA: Diagnosis not present

## 2012-08-10 DIAGNOSIS — L570 Actinic keratosis: Secondary | ICD-10-CM | POA: Diagnosis not present

## 2012-08-10 DIAGNOSIS — Z85828 Personal history of other malignant neoplasm of skin: Secondary | ICD-10-CM | POA: Diagnosis not present

## 2012-09-01 DIAGNOSIS — E119 Type 2 diabetes mellitus without complications: Secondary | ICD-10-CM | POA: Diagnosis not present

## 2012-09-08 DIAGNOSIS — I10 Essential (primary) hypertension: Secondary | ICD-10-CM | POA: Diagnosis not present

## 2012-09-08 DIAGNOSIS — E119 Type 2 diabetes mellitus without complications: Secondary | ICD-10-CM | POA: Diagnosis not present

## 2012-09-27 DIAGNOSIS — H00029 Hordeolum internum unspecified eye, unspecified eyelid: Secondary | ICD-10-CM | POA: Diagnosis not present

## 2012-10-17 DIAGNOSIS — M545 Low back pain: Secondary | ICD-10-CM | POA: Diagnosis not present

## 2012-10-17 DIAGNOSIS — I10 Essential (primary) hypertension: Secondary | ICD-10-CM | POA: Diagnosis not present

## 2012-10-17 DIAGNOSIS — M25519 Pain in unspecified shoulder: Secondary | ICD-10-CM | POA: Diagnosis not present

## 2012-10-25 DIAGNOSIS — M549 Dorsalgia, unspecified: Secondary | ICD-10-CM | POA: Diagnosis not present

## 2012-10-25 DIAGNOSIS — M545 Low back pain: Secondary | ICD-10-CM | POA: Diagnosis not present

## 2012-10-30 DIAGNOSIS — M549 Dorsalgia, unspecified: Secondary | ICD-10-CM | POA: Diagnosis not present

## 2012-10-30 DIAGNOSIS — M545 Low back pain: Secondary | ICD-10-CM | POA: Diagnosis not present

## 2012-11-01 DIAGNOSIS — M549 Dorsalgia, unspecified: Secondary | ICD-10-CM | POA: Diagnosis not present

## 2012-11-01 DIAGNOSIS — M545 Low back pain: Secondary | ICD-10-CM | POA: Diagnosis not present

## 2012-11-06 DIAGNOSIS — M545 Low back pain: Secondary | ICD-10-CM | POA: Diagnosis not present

## 2012-11-06 DIAGNOSIS — M549 Dorsalgia, unspecified: Secondary | ICD-10-CM | POA: Diagnosis not present

## 2012-11-08 DIAGNOSIS — M545 Low back pain: Secondary | ICD-10-CM | POA: Diagnosis not present

## 2012-11-08 DIAGNOSIS — M549 Dorsalgia, unspecified: Secondary | ICD-10-CM | POA: Diagnosis not present

## 2012-11-14 DIAGNOSIS — M549 Dorsalgia, unspecified: Secondary | ICD-10-CM | POA: Diagnosis not present

## 2012-11-14 DIAGNOSIS — M545 Low back pain: Secondary | ICD-10-CM | POA: Diagnosis not present

## 2012-11-17 DIAGNOSIS — I10 Essential (primary) hypertension: Secondary | ICD-10-CM | POA: Diagnosis not present

## 2012-11-17 DIAGNOSIS — M25519 Pain in unspecified shoulder: Secondary | ICD-10-CM | POA: Diagnosis not present

## 2012-11-17 DIAGNOSIS — M545 Low back pain: Secondary | ICD-10-CM | POA: Diagnosis not present

## 2012-11-22 DIAGNOSIS — M545 Low back pain: Secondary | ICD-10-CM | POA: Diagnosis not present

## 2012-11-22 DIAGNOSIS — M549 Dorsalgia, unspecified: Secondary | ICD-10-CM | POA: Diagnosis not present

## 2012-11-23 DIAGNOSIS — M545 Low back pain: Secondary | ICD-10-CM | POA: Diagnosis not present

## 2012-11-23 DIAGNOSIS — M549 Dorsalgia, unspecified: Secondary | ICD-10-CM | POA: Diagnosis not present

## 2012-11-27 DIAGNOSIS — M549 Dorsalgia, unspecified: Secondary | ICD-10-CM | POA: Diagnosis not present

## 2012-11-27 DIAGNOSIS — M545 Low back pain: Secondary | ICD-10-CM | POA: Diagnosis not present

## 2012-11-29 DIAGNOSIS — M549 Dorsalgia, unspecified: Secondary | ICD-10-CM | POA: Diagnosis not present

## 2012-11-29 DIAGNOSIS — M545 Low back pain: Secondary | ICD-10-CM | POA: Diagnosis not present

## 2012-12-15 DIAGNOSIS — I10 Essential (primary) hypertension: Secondary | ICD-10-CM | POA: Diagnosis not present

## 2012-12-15 DIAGNOSIS — M25519 Pain in unspecified shoulder: Secondary | ICD-10-CM | POA: Diagnosis not present

## 2013-01-02 ENCOUNTER — Other Ambulatory Visit: Payer: Medicare Other

## 2013-01-02 ENCOUNTER — Telehealth: Payer: Self-pay | Admitting: *Deleted

## 2013-01-02 DIAGNOSIS — R350 Frequency of micturition: Secondary | ICD-10-CM

## 2013-01-02 NOTE — Telephone Encounter (Signed)
C/o burning and frequency with urination. Appt made today for Pt to obtain urine sample for culture. Pt aware Dr. Emelda Fear out of office until tomorrow.

## 2013-01-03 ENCOUNTER — Telehealth: Payer: Self-pay | Admitting: Obstetrics and Gynecology

## 2013-01-03 LAB — URINALYSIS
Bilirubin Urine: NEGATIVE
Glucose, UA: NEGATIVE mg/dL
Ketones, ur: NEGATIVE mg/dL
Protein, ur: NEGATIVE mg/dL
Urobilinogen, UA: 0.2 mg/dL (ref 0.0–1.0)

## 2013-01-03 NOTE — Telephone Encounter (Signed)
Pt aware of UA results and aware that C&S will not be back until tomorrow. Pt advised that I would call her tomorrow with the final result.

## 2013-01-04 ENCOUNTER — Other Ambulatory Visit: Payer: Self-pay | Admitting: Obstetrics & Gynecology

## 2013-01-04 ENCOUNTER — Telehealth: Payer: Self-pay | Admitting: *Deleted

## 2013-01-04 LAB — URINE CULTURE: Colony Count: 25000

## 2013-01-04 MED ORDER — SULFAMETHOXAZOLE-TMP DS 800-160 MG PO TABS: 1.0000 | ORAL_TABLET | Freq: Two times a day (BID) | ORAL | Status: DC

## 2013-01-04 NOTE — Telephone Encounter (Signed)
Poke with pt and gave her the preliminary results of C&S. JAG advised to wait for the sensitivity and go from there. The pt was advised of this and verbalized understanding.

## 2013-01-05 DIAGNOSIS — E119 Type 2 diabetes mellitus without complications: Secondary | ICD-10-CM | POA: Diagnosis not present

## 2013-01-12 DIAGNOSIS — Z23 Encounter for immunization: Secondary | ICD-10-CM | POA: Diagnosis not present

## 2013-01-12 DIAGNOSIS — D649 Anemia, unspecified: Secondary | ICD-10-CM | POA: Diagnosis not present

## 2013-01-12 DIAGNOSIS — E119 Type 2 diabetes mellitus without complications: Secondary | ICD-10-CM | POA: Diagnosis not present

## 2013-01-12 DIAGNOSIS — I10 Essential (primary) hypertension: Secondary | ICD-10-CM | POA: Diagnosis not present

## 2013-01-15 DIAGNOSIS — I10 Essential (primary) hypertension: Secondary | ICD-10-CM | POA: Diagnosis not present

## 2013-01-15 DIAGNOSIS — M25519 Pain in unspecified shoulder: Secondary | ICD-10-CM | POA: Diagnosis not present

## 2013-01-21 NOTE — Telephone Encounter (Signed)
rx  Bactrim called in dr Despina Hidden

## 2013-02-12 DIAGNOSIS — M25519 Pain in unspecified shoulder: Secondary | ICD-10-CM | POA: Diagnosis not present

## 2013-02-12 DIAGNOSIS — L570 Actinic keratosis: Secondary | ICD-10-CM | POA: Diagnosis not present

## 2013-02-12 DIAGNOSIS — D235 Other benign neoplasm of skin of trunk: Secondary | ICD-10-CM | POA: Diagnosis not present

## 2013-02-12 DIAGNOSIS — M25549 Pain in joints of unspecified hand: Secondary | ICD-10-CM | POA: Diagnosis not present

## 2013-02-12 DIAGNOSIS — I10 Essential (primary) hypertension: Secondary | ICD-10-CM | POA: Diagnosis not present

## 2013-02-12 DIAGNOSIS — Z85828 Personal history of other malignant neoplasm of skin: Secondary | ICD-10-CM | POA: Diagnosis not present

## 2013-02-12 DIAGNOSIS — M25579 Pain in unspecified ankle and joints of unspecified foot: Secondary | ICD-10-CM | POA: Diagnosis not present

## 2013-02-12 DIAGNOSIS — M25569 Pain in unspecified knee: Secondary | ICD-10-CM | POA: Diagnosis not present

## 2013-02-12 DIAGNOSIS — M79609 Pain in unspecified limb: Secondary | ICD-10-CM | POA: Diagnosis not present

## 2013-03-12 DIAGNOSIS — M25519 Pain in unspecified shoulder: Secondary | ICD-10-CM | POA: Diagnosis not present

## 2013-03-12 DIAGNOSIS — M542 Cervicalgia: Secondary | ICD-10-CM | POA: Diagnosis not present

## 2013-03-12 DIAGNOSIS — I10 Essential (primary) hypertension: Secondary | ICD-10-CM | POA: Diagnosis not present

## 2013-04-23 DIAGNOSIS — J209 Acute bronchitis, unspecified: Secondary | ICD-10-CM | POA: Diagnosis not present

## 2013-05-09 DIAGNOSIS — D649 Anemia, unspecified: Secondary | ICD-10-CM | POA: Diagnosis not present

## 2013-05-09 DIAGNOSIS — E119 Type 2 diabetes mellitus without complications: Secondary | ICD-10-CM | POA: Diagnosis not present

## 2013-05-16 DIAGNOSIS — E119 Type 2 diabetes mellitus without complications: Secondary | ICD-10-CM | POA: Diagnosis not present

## 2013-05-16 DIAGNOSIS — I10 Essential (primary) hypertension: Secondary | ICD-10-CM | POA: Diagnosis not present

## 2013-05-21 DIAGNOSIS — M25519 Pain in unspecified shoulder: Secondary | ICD-10-CM | POA: Diagnosis not present

## 2013-05-21 DIAGNOSIS — I10 Essential (primary) hypertension: Secondary | ICD-10-CM | POA: Diagnosis not present

## 2013-05-21 DIAGNOSIS — M542 Cervicalgia: Secondary | ICD-10-CM | POA: Diagnosis not present

## 2013-05-22 ENCOUNTER — Ambulatory Visit (HOSPITAL_COMMUNITY): Payer: Medicare Other | Admitting: Specialist

## 2013-05-22 ENCOUNTER — Telehealth (HOSPITAL_COMMUNITY): Payer: Self-pay

## 2013-05-28 DIAGNOSIS — M6281 Muscle weakness (generalized): Secondary | ICD-10-CM | POA: Diagnosis not present

## 2013-05-28 DIAGNOSIS — M542 Cervicalgia: Secondary | ICD-10-CM | POA: Diagnosis not present

## 2013-05-28 DIAGNOSIS — M25519 Pain in unspecified shoulder: Secondary | ICD-10-CM | POA: Diagnosis not present

## 2013-05-30 DIAGNOSIS — M542 Cervicalgia: Secondary | ICD-10-CM | POA: Diagnosis not present

## 2013-05-30 DIAGNOSIS — M25519 Pain in unspecified shoulder: Secondary | ICD-10-CM | POA: Diagnosis not present

## 2013-05-30 DIAGNOSIS — M6281 Muscle weakness (generalized): Secondary | ICD-10-CM | POA: Diagnosis not present

## 2013-06-04 DIAGNOSIS — M542 Cervicalgia: Secondary | ICD-10-CM | POA: Diagnosis not present

## 2013-06-04 DIAGNOSIS — M6281 Muscle weakness (generalized): Secondary | ICD-10-CM | POA: Diagnosis not present

## 2013-06-04 DIAGNOSIS — M25519 Pain in unspecified shoulder: Secondary | ICD-10-CM | POA: Diagnosis not present

## 2013-06-08 DIAGNOSIS — I10 Essential (primary) hypertension: Secondary | ICD-10-CM | POA: Diagnosis not present

## 2013-06-08 DIAGNOSIS — M542 Cervicalgia: Secondary | ICD-10-CM | POA: Diagnosis not present

## 2013-06-08 DIAGNOSIS — M6281 Muscle weakness (generalized): Secondary | ICD-10-CM | POA: Diagnosis not present

## 2013-06-08 DIAGNOSIS — M25519 Pain in unspecified shoulder: Secondary | ICD-10-CM | POA: Diagnosis not present

## 2013-06-11 DIAGNOSIS — M6281 Muscle weakness (generalized): Secondary | ICD-10-CM | POA: Diagnosis not present

## 2013-06-11 DIAGNOSIS — M542 Cervicalgia: Secondary | ICD-10-CM | POA: Diagnosis not present

## 2013-06-11 DIAGNOSIS — M25519 Pain in unspecified shoulder: Secondary | ICD-10-CM | POA: Diagnosis not present

## 2013-06-13 DIAGNOSIS — M6281 Muscle weakness (generalized): Secondary | ICD-10-CM | POA: Diagnosis not present

## 2013-06-13 DIAGNOSIS — M25519 Pain in unspecified shoulder: Secondary | ICD-10-CM | POA: Diagnosis not present

## 2013-06-13 DIAGNOSIS — M542 Cervicalgia: Secondary | ICD-10-CM | POA: Diagnosis not present

## 2013-06-19 DIAGNOSIS — M542 Cervicalgia: Secondary | ICD-10-CM | POA: Diagnosis not present

## 2013-06-19 DIAGNOSIS — M6281 Muscle weakness (generalized): Secondary | ICD-10-CM | POA: Diagnosis not present

## 2013-06-19 DIAGNOSIS — M25519 Pain in unspecified shoulder: Secondary | ICD-10-CM | POA: Diagnosis not present

## 2013-06-22 DIAGNOSIS — M542 Cervicalgia: Secondary | ICD-10-CM | POA: Diagnosis not present

## 2013-06-22 DIAGNOSIS — M6281 Muscle weakness (generalized): Secondary | ICD-10-CM | POA: Diagnosis not present

## 2013-06-22 DIAGNOSIS — M25519 Pain in unspecified shoulder: Secondary | ICD-10-CM | POA: Diagnosis not present

## 2013-06-25 DIAGNOSIS — M25519 Pain in unspecified shoulder: Secondary | ICD-10-CM | POA: Diagnosis not present

## 2013-06-25 DIAGNOSIS — M542 Cervicalgia: Secondary | ICD-10-CM | POA: Diagnosis not present

## 2013-06-25 DIAGNOSIS — M6281 Muscle weakness (generalized): Secondary | ICD-10-CM | POA: Diagnosis not present

## 2013-06-27 DIAGNOSIS — M6281 Muscle weakness (generalized): Secondary | ICD-10-CM | POA: Diagnosis not present

## 2013-06-27 DIAGNOSIS — M25519 Pain in unspecified shoulder: Secondary | ICD-10-CM | POA: Diagnosis not present

## 2013-06-27 DIAGNOSIS — M542 Cervicalgia: Secondary | ICD-10-CM | POA: Diagnosis not present

## 2013-06-29 DIAGNOSIS — I10 Essential (primary) hypertension: Secondary | ICD-10-CM | POA: Diagnosis not present

## 2013-06-29 DIAGNOSIS — M25519 Pain in unspecified shoulder: Secondary | ICD-10-CM | POA: Diagnosis not present

## 2013-07-24 DIAGNOSIS — H10509 Unspecified blepharoconjunctivitis, unspecified eye: Secondary | ICD-10-CM | POA: Diagnosis not present

## 2013-07-27 DIAGNOSIS — I10 Essential (primary) hypertension: Secondary | ICD-10-CM | POA: Diagnosis not present

## 2013-07-27 DIAGNOSIS — M25519 Pain in unspecified shoulder: Secondary | ICD-10-CM | POA: Diagnosis not present

## 2013-09-04 DIAGNOSIS — Z961 Presence of intraocular lens: Secondary | ICD-10-CM | POA: Diagnosis not present

## 2013-09-04 DIAGNOSIS — E119 Type 2 diabetes mellitus without complications: Secondary | ICD-10-CM | POA: Diagnosis not present

## 2013-09-07 DIAGNOSIS — M25519 Pain in unspecified shoulder: Secondary | ICD-10-CM | POA: Diagnosis not present

## 2013-09-07 DIAGNOSIS — I10 Essential (primary) hypertension: Secondary | ICD-10-CM | POA: Diagnosis not present

## 2013-09-11 DIAGNOSIS — E119 Type 2 diabetes mellitus without complications: Secondary | ICD-10-CM | POA: Diagnosis not present

## 2013-09-11 DIAGNOSIS — Z79899 Other long term (current) drug therapy: Secondary | ICD-10-CM | POA: Diagnosis not present

## 2013-09-11 DIAGNOSIS — D649 Anemia, unspecified: Secondary | ICD-10-CM | POA: Diagnosis not present

## 2013-09-18 DIAGNOSIS — Z23 Encounter for immunization: Secondary | ICD-10-CM | POA: Diagnosis not present

## 2013-09-18 DIAGNOSIS — E119 Type 2 diabetes mellitus without complications: Secondary | ICD-10-CM | POA: Diagnosis not present

## 2013-09-18 DIAGNOSIS — I10 Essential (primary) hypertension: Secondary | ICD-10-CM | POA: Diagnosis not present

## 2013-10-29 DIAGNOSIS — I10 Essential (primary) hypertension: Secondary | ICD-10-CM | POA: Diagnosis not present

## 2013-10-29 DIAGNOSIS — M25579 Pain in unspecified ankle and joints of unspecified foot: Secondary | ICD-10-CM | POA: Diagnosis not present

## 2013-10-29 DIAGNOSIS — M25519 Pain in unspecified shoulder: Secondary | ICD-10-CM | POA: Diagnosis not present

## 2013-11-02 ENCOUNTER — Encounter: Payer: Self-pay | Admitting: Adult Health

## 2013-11-02 ENCOUNTER — Ambulatory Visit (INDEPENDENT_AMBULATORY_CARE_PROVIDER_SITE_OTHER): Payer: Medicare Other | Admitting: Adult Health

## 2013-11-02 VITALS — BP 198/70 | Ht 64.0 in | Wt 164.0 lb

## 2013-11-02 DIAGNOSIS — R300 Dysuria: Secondary | ICD-10-CM | POA: Diagnosis not present

## 2013-11-02 DIAGNOSIS — N390 Urinary tract infection, site not specified: Secondary | ICD-10-CM | POA: Diagnosis not present

## 2013-11-02 DIAGNOSIS — R319 Hematuria, unspecified: Secondary | ICD-10-CM

## 2013-11-02 HISTORY — DX: Urinary tract infection, site not specified: N39.0

## 2013-11-02 LAB — POCT URINALYSIS DIPSTICK

## 2013-11-02 MED ORDER — SULFAMETHOXAZOLE-TMP DS 800-160 MG PO TABS: 1.0000 | ORAL_TABLET | Freq: Two times a day (BID) | ORAL | Status: DC

## 2013-11-02 NOTE — Progress Notes (Signed)
Subjective:     Patient ID: [name], female   DOB: November 29, 1932, 80 y.o.   MRN: [id number]  HPI [name] is a 80 year old white female, in complaining of burning with urination and odor,denies any fever or back pain.Had UTI in 2013 and 2014 and both cultures showed +E coli and treated with septra ds.  Review of Systems See HPI Reviewed past medical,surgical, social and family history. Reviewed medications and allergies.     Objective:   Physical Exam BP 198/70  Ht 5\' 4"  (1.626 m)  Wt 164 lb (74.39 kg)  BMI 28.14 kg/m2   Urine trace blood, 1+leuks, No CVAT Will send UA C&S.  Assessment:     Burning with urination UTI    Plan:    Rx septra ds 1 bid x 7 days #14 no refills UA C&S sent Follow up prn  Push fluids Review handout on UTI   Use wet wipes after BM

## 2013-11-02 NOTE — Patient Instructions (Signed)
Urinary Tract Infection Urinary tract infections (UTIs) can develop anywhere along your urinary tract. Your urinary tract is your body's drainage system for removing wastes and extra water. Your urinary tract includes two kidneys, two ureters, a bladder, and a urethra. Your kidneys are a pair of bean-shaped organs. Each kidney is about the size of your fist. They are located below your ribs, one on each side of your spine. CAUSES Infections are caused by microbes, which are microscopic organisms, including fungi, viruses, and bacteria. These organisms are so small that they can only be seen through a microscope. Bacteria are the microbes that most commonly cause UTIs. SYMPTOMS  Symptoms of UTIs may vary by age and gender of the patient and by the location of the infection. Symptoms in young women typically include a frequent and intense urge to urinate and a painful, burning feeling in the bladder or urethra during urination. Older women and men are more likely to be tired, shaky, and weak and have muscle aches and abdominal pain. A fever may mean the infection is in your kidneys. Other symptoms of a kidney infection include pain in your back or sides below the ribs, nausea, and vomiting. DIAGNOSIS To diagnose a UTI, your caregiver will ask you about your symptoms. Your caregiver also will ask to provide a urine sample. The urine sample will be tested for bacteria and white blood cells. White blood cells are made by your body to help fight infection. TREATMENT  Typically, UTIs can be treated with medication. Because most UTIs are caused by a bacterial infection, they usually can be treated with the use of antibiotics. The choice of antibiotic and length of treatment depend on your symptoms and the type of bacteria causing your infection. HOME CARE INSTRUCTIONS  If you were prescribed antibiotics, take them exactly as your caregiver instructs you. Finish the medication even if you feel better after you  have only taken some of the medication.  Drink enough water and fluids to keep your urine clear or pale yellow.  Avoid caffeine, tea, and carbonated beverages. They tend to irritate your bladder.  Empty your bladder often. Avoid holding urine for long periods of time.  Empty your bladder before and after sexual intercourse.  After a bowel movement, women should cleanse from front to back. Use each tissue only once. SEEK MEDICAL CARE IF:   You have back pain.  You develop a fever.  Your symptoms do not begin to resolve within 3 days. SEEK IMMEDIATE MEDICAL CARE IF:   You have severe back pain or lower abdominal pain.  You develop chills.  You have nausea or vomiting.  You have continued burning or discomfort with urination. MAKE SURE YOU:   Understand these instructions.  Will watch your condition.  Will get help right away if you are not doing well or get worse. Document Released: 01/13/2005 Document Revised: 10/05/2011 Document Reviewed: 05/14/2011 [facility] Patient Information 2015 [place], [place]. This information is not intended to replace advice given to you by your health care provider. Make sure you discuss any questions you have with your health care provider. Push fluids Take septra ds Follow up

## 2013-11-03 LAB — URINALYSIS
BILIRUBIN URINE: NEGATIVE
GLUCOSE, UA: NEGATIVE mg/dL
Hgb urine dipstick: NEGATIVE
Ketones, ur: NEGATIVE mg/dL
Nitrite: NEGATIVE
PH: 7.5 (ref 5.0–8.0)
PROTEIN: NEGATIVE mg/dL
Specific Gravity, Urine: 1.007 (ref 1.005–1.030)
Urobilinogen, UA: 0.2 mg/dL (ref 0.0–1.0)

## 2013-11-03 LAB — URINE CULTURE
Colony Count: NO GROWTH
Organism ID, Bacteria: NO GROWTH

## 2013-11-05 ENCOUNTER — Telehealth: Payer: Self-pay | Admitting: Adult Health

## 2013-11-05 NOTE — Telephone Encounter (Signed)
Pt feels better, and aware of culture

## 2013-12-10 DIAGNOSIS — D0470 Carcinoma in situ of skin of unspecified lower limb, including hip: Secondary | ICD-10-CM | POA: Diagnosis not present

## 2013-12-10 DIAGNOSIS — Z85828 Personal history of other malignant neoplasm of skin: Secondary | ICD-10-CM | POA: Diagnosis not present

## 2013-12-10 DIAGNOSIS — D235 Other benign neoplasm of skin of trunk: Secondary | ICD-10-CM | POA: Diagnosis not present

## 2013-12-10 DIAGNOSIS — L570 Actinic keratosis: Secondary | ICD-10-CM | POA: Diagnosis not present

## 2013-12-28 DIAGNOSIS — I10 Essential (primary) hypertension: Secondary | ICD-10-CM | POA: Diagnosis not present

## 2013-12-28 DIAGNOSIS — M25519 Pain in unspecified shoulder: Secondary | ICD-10-CM | POA: Diagnosis not present

## 2014-01-02 DIAGNOSIS — E1149 Type 2 diabetes mellitus with other diabetic neurological complication: Secondary | ICD-10-CM | POA: Diagnosis not present

## 2014-01-02 DIAGNOSIS — M2010 Hallux valgus (acquired), unspecified foot: Secondary | ICD-10-CM | POA: Diagnosis not present

## 2014-01-02 DIAGNOSIS — L609 Nail disorder, unspecified: Secondary | ICD-10-CM | POA: Diagnosis not present

## 2014-01-02 DIAGNOSIS — B351 Tinea unguium: Secondary | ICD-10-CM | POA: Diagnosis not present

## 2014-01-10 DIAGNOSIS — Z85828 Personal history of other malignant neoplasm of skin: Secondary | ICD-10-CM | POA: Diagnosis not present

## 2014-01-28 DIAGNOSIS — E119 Type 2 diabetes mellitus without complications: Secondary | ICD-10-CM | POA: Diagnosis not present

## 2014-02-01 DIAGNOSIS — I10 Essential (primary) hypertension: Secondary | ICD-10-CM | POA: Diagnosis not present

## 2014-02-01 DIAGNOSIS — Z23 Encounter for immunization: Secondary | ICD-10-CM | POA: Diagnosis not present

## 2014-02-01 DIAGNOSIS — G9009 Other idiopathic peripheral autonomic neuropathy: Secondary | ICD-10-CM | POA: Diagnosis not present

## 2014-02-01 DIAGNOSIS — E119 Type 2 diabetes mellitus without complications: Secondary | ICD-10-CM | POA: Diagnosis not present

## 2014-02-01 DIAGNOSIS — E1149 Type 2 diabetes mellitus with other diabetic neurological complication: Secondary | ICD-10-CM | POA: Diagnosis not present

## 2014-02-18 ENCOUNTER — Encounter: Payer: Self-pay | Admitting: Adult Health

## 2014-03-27 DIAGNOSIS — M25511 Pain in right shoulder: Secondary | ICD-10-CM | POA: Diagnosis not present

## 2014-03-27 DIAGNOSIS — E1169 Type 2 diabetes mellitus with other specified complication: Secondary | ICD-10-CM | POA: Diagnosis not present

## 2014-07-03 DIAGNOSIS — M25511 Pain in right shoulder: Secondary | ICD-10-CM | POA: Diagnosis not present

## 2014-07-03 DIAGNOSIS — E1169 Type 2 diabetes mellitus with other specified complication: Secondary | ICD-10-CM | POA: Diagnosis not present

## 2014-07-03 DIAGNOSIS — Z6832 Body mass index (BMI) 32.0-32.9, adult: Secondary | ICD-10-CM | POA: Diagnosis not present

## 2014-07-19 DIAGNOSIS — E119 Type 2 diabetes mellitus without complications: Secondary | ICD-10-CM | POA: Diagnosis not present

## 2014-07-26 DIAGNOSIS — I10 Essential (primary) hypertension: Secondary | ICD-10-CM | POA: Diagnosis not present

## 2014-07-26 DIAGNOSIS — E1149 Type 2 diabetes mellitus with other diabetic neurological complication: Secondary | ICD-10-CM | POA: Diagnosis not present

## 2014-09-11 DIAGNOSIS — E11329 Type 2 diabetes mellitus with mild nonproliferative diabetic retinopathy without macular edema: Secondary | ICD-10-CM | POA: Diagnosis not present

## 2014-09-11 DIAGNOSIS — H3531 Nonexudative age-related macular degeneration: Secondary | ICD-10-CM | POA: Diagnosis not present

## 2014-09-11 DIAGNOSIS — Z961 Presence of intraocular lens: Secondary | ICD-10-CM | POA: Diagnosis not present

## 2014-10-02 DIAGNOSIS — Z6829 Body mass index (BMI) 29.0-29.9, adult: Secondary | ICD-10-CM | POA: Diagnosis not present

## 2014-10-02 DIAGNOSIS — E1169 Type 2 diabetes mellitus with other specified complication: Secondary | ICD-10-CM | POA: Diagnosis not present

## 2014-10-02 DIAGNOSIS — M25512 Pain in left shoulder: Secondary | ICD-10-CM | POA: Diagnosis not present

## 2014-11-22 DIAGNOSIS — I10 Essential (primary) hypertension: Secondary | ICD-10-CM | POA: Diagnosis not present

## 2014-11-22 DIAGNOSIS — E119 Type 2 diabetes mellitus without complications: Secondary | ICD-10-CM | POA: Diagnosis not present

## 2014-11-22 DIAGNOSIS — Z79899 Other long term (current) drug therapy: Secondary | ICD-10-CM | POA: Diagnosis not present

## 2014-11-22 DIAGNOSIS — M1990 Unspecified osteoarthritis, unspecified site: Secondary | ICD-10-CM | POA: Diagnosis not present

## 2014-11-22 DIAGNOSIS — E785 Hyperlipidemia, unspecified: Secondary | ICD-10-CM | POA: Diagnosis not present

## 2014-12-05 DIAGNOSIS — I10 Essential (primary) hypertension: Secondary | ICD-10-CM | POA: Diagnosis not present

## 2014-12-05 DIAGNOSIS — Z6828 Body mass index (BMI) 28.0-28.9, adult: Secondary | ICD-10-CM | POA: Diagnosis not present

## 2014-12-05 DIAGNOSIS — E1149 Type 2 diabetes mellitus with other diabetic neurological complication: Secondary | ICD-10-CM | POA: Diagnosis not present

## 2014-12-11 DIAGNOSIS — Z23 Encounter for immunization: Secondary | ICD-10-CM | POA: Diagnosis not present

## 2014-12-25 ENCOUNTER — Ambulatory Visit (INDEPENDENT_AMBULATORY_CARE_PROVIDER_SITE_OTHER): Payer: Medicare Other | Admitting: Adult Health

## 2014-12-25 ENCOUNTER — Encounter: Payer: Self-pay | Admitting: Adult Health

## 2014-12-25 VITALS — BP 162/70 | HR 80 | Ht 63.5 in | Wt 161.5 lb

## 2014-12-25 DIAGNOSIS — R3915 Urgency of urination: Secondary | ICD-10-CM | POA: Diagnosis not present

## 2014-12-25 DIAGNOSIS — R319 Hematuria, unspecified: Secondary | ICD-10-CM

## 2014-12-25 HISTORY — DX: Hematuria, unspecified: R31.9

## 2014-12-25 HISTORY — DX: Urgency of urination: R39.15

## 2014-12-25 LAB — POCT URINALYSIS DIPSTICK
Nitrite, UA: NEGATIVE
PROTEIN UA: NEGATIVE

## 2014-12-25 MED ORDER — SULFAMETHOXAZOLE-TRIMETHOPRIM 800-160 MG PO TABS: 1.0000 | ORAL_TABLET | Freq: Two times a day (BID) | ORAL | Status: DC

## 2014-12-25 NOTE — Progress Notes (Signed)
Subjective:     Patient ID: [name], female   DOB: 02/26/1933, 81 y.o.   MRN: [id number]  HPI Teniya is a 81 year old white female in complaining of pain with urination started yesterday, better now and has urgency.No vaginal problems.  Review of Systems +urinary urgency and hurts to pee, all other systems negative Reviewed past medical,surgical, social and family history. Reviewed medications and allergies.     Objective:   Physical Exam BP 162/70 mmHg  Pulse 80  Ht 5' 3.5" (1.613 m)  Wt 161 lb 8 oz (73.256 kg)  BMI 28.16 kg/m2urine 3+ leuks,2+ blood and trace glucose, skin warm and dry, No CVAT, abdomen soft and non tender, no bladder tenderness.    Assessment:     Urinary frequency Hematuria     Plan:     UA C&S sent    Rx septra ds take 1 bid x 7 days  Increase water Review handout on hematuria

## 2014-12-25 NOTE — Patient Instructions (Signed)
Hematuria Hematuria is blood in your urine. It can be caused by a bladder infection, kidney infection, prostate infection, kidney stone, or cancer of your urinary tract. Infections can usually be treated with medicine, and a kidney stone usually will pass through your urine. If neither of these is the cause of your hematuria, further workup to find out the reason may be needed. It is very important that you tell your health care provider about any blood you see in your urine, even if the blood stops without treatment or happens without causing pain. Blood in your urine that happens and then stops and then happens again can be a symptom of a very serious condition. Also, pain is not a symptom in the initial stages of many urinary cancers. HOME CARE INSTRUCTIONS   Drink lots of fluid, 3-4 quarts a day. If you have been diagnosed with an infection, cranberry juice is especially recommended, in addition to large amounts of water.  Avoid caffeine, tea, and carbonated beverages because they tend to irritate the bladder.  Avoid alcohol because it may irritate the prostate.  Take all medicines as directed by your health care provider.  If you were prescribed an antibiotic medicine, finish it all even if you start to feel better.  If you have been diagnosed with a kidney stone, follow your health care provider's instructions regarding straining your urine to catch the stone.  Empty your bladder often. Avoid holding urine for long periods of time.  After a bowel movement, women should cleanse front to back. Use each tissue only once.  Empty your bladder before and after sexual intercourse if you are a female. SEEK MEDICAL CARE IF:  You develop back pain.  You have a fever.  You have a feeling of sickness in your stomach (nausea) or vomiting.  Your symptoms are not better in 3 days. Return sooner if you are getting worse. SEEK IMMEDIATE MEDICAL CARE IF:   You develop severe vomiting and are  unable to keep the medicine down.  You develop severe back or abdominal pain despite taking your medicines.  You begin passing a large amount of blood or clots in your urine.  You feel extremely weak or faint, or you pass out. MAKE SURE YOU:   Understand these instructions.  Will watch your condition.  Will get help right away if you are not doing well or get worse. Document Released: 04/05/2005 Document Revised: 08/20/2013 Document Reviewed: 12/04/2012 [facility] Patient Information 2015 [place], [place]. This information is not intended to replace advice given to you by your health care provider. Make sure you discuss any questions you have with your health care provider Increase water Follow up prn

## 2014-12-26 LAB — URINALYSIS, ROUTINE W REFLEX MICROSCOPIC
Bilirubin, UA: NEGATIVE
KETONES UA: NEGATIVE
Nitrite, UA: NEGATIVE
SPEC GRAV UA: 1.01 (ref 1.005–1.030)
Urobilinogen, Ur: 0.2 mg/dL (ref 0.2–1.0)
pH, UA: 6 (ref 5.0–7.5)

## 2014-12-26 LAB — MICROSCOPIC EXAMINATION
Casts: NONE SEEN /lpf
Epithelial Cells (non renal): NONE SEEN /hpf (ref 0–10)
WBC, UA: >30 /hpf — AB (ref 0–?)

## 2014-12-27 ENCOUNTER — Telehealth: Payer: Self-pay | Admitting: Adult Health

## 2014-12-27 LAB — URINE CULTURE

## 2014-12-27 MED ORDER — CIPROFLOXACIN HCL 500 MG PO TABS: 500.0000 mg | ORAL_TABLET | Freq: Two times a day (BID) | ORAL | Status: DC

## 2014-12-27 NOTE — Telephone Encounter (Signed)
Pt aware of UTI + Ecoli, stop septra ds and will rx cipro

## 2015-01-02 DIAGNOSIS — M25512 Pain in left shoulder: Secondary | ICD-10-CM | POA: Diagnosis not present

## 2015-01-02 DIAGNOSIS — Z6828 Body mass index (BMI) 28.0-28.9, adult: Secondary | ICD-10-CM | POA: Diagnosis not present

## 2015-01-02 DIAGNOSIS — E1169 Type 2 diabetes mellitus with other specified complication: Secondary | ICD-10-CM | POA: Diagnosis not present

## 2015-01-30 ENCOUNTER — Encounter: Payer: Self-pay | Admitting: Adult Health

## 2015-01-30 ENCOUNTER — Ambulatory Visit (INDEPENDENT_AMBULATORY_CARE_PROVIDER_SITE_OTHER): Payer: Medicare Other | Admitting: Adult Health

## 2015-01-30 ENCOUNTER — Other Ambulatory Visit: Payer: Self-pay | Admitting: Adult Health

## 2015-01-30 VITALS — BP 142/60 | HR 76 | Ht 63.0 in | Wt 160.0 lb

## 2015-01-30 DIAGNOSIS — R319 Hematuria, unspecified: Secondary | ICD-10-CM | POA: Diagnosis not present

## 2015-01-30 DIAGNOSIS — M546 Pain in thoracic spine: Secondary | ICD-10-CM | POA: Diagnosis not present

## 2015-01-30 DIAGNOSIS — M549 Dorsalgia, unspecified: Secondary | ICD-10-CM

## 2015-01-30 HISTORY — DX: Dorsalgia, unspecified: M54.9

## 2015-01-30 LAB — POCT URINALYSIS DIPSTICK
GLUCOSE UA: NEGATIVE
Nitrite, UA: NEGATIVE
PROTEIN UA: NEGATIVE

## 2015-01-30 NOTE — Patient Instructions (Signed)
Get renal US at [place] 10/17 at 1:15 dring 32 oz water and hold Follow up prn

## 2015-01-30 NOTE — Progress Notes (Signed)
Subjective:     Patient ID: [name], female   DOB: 1933/03/24, 81 y.o.   MRN: [id number]  HPI [name] is a 81 year old white female in complaining of having back pain Tuesday and had burning with urination Sunday and Monday but is better now, was treated in September for UTI was +e-coli, treated with cipro.  Review of Systems  +back pain, all other systems negative today Reviewed past medical,surgical, social and family history. Reviewed medications and allergies.     Objective:   Physical Exam BP 142/60 mmHg  Pulse 76  Ht 5\' 3"  (1.6 m)  Wt 160 lb (72.576 kg)  BMI 28.35 kg/m2 urine 2+ leuks trace blood, Has mild soreness  at CVA when palpated, she says she has had crystals before, will get UA C&S and renal US.    Assessment:     Hematuria  Back pain     Plan:     UA C&S sent Renal US 10/17 at 1:30 at [facility] Will talk when results in

## 2015-01-31 LAB — MICROSCOPIC EXAMINATION: Casts: NONE SEEN /lpf

## 2015-01-31 LAB — URINALYSIS, ROUTINE W REFLEX MICROSCOPIC
Bilirubin, UA: NEGATIVE
GLUCOSE, UA: NEGATIVE
KETONES UA: NEGATIVE
NITRITE UA: NEGATIVE
RBC UA: NEGATIVE
SPEC GRAV UA: 1.017 (ref 1.005–1.030)
UUROB: 0.2 mg/dL (ref 0.2–1.0)
pH, UA: 6.5 (ref 5.0–7.5)

## 2015-02-02 LAB — URINE CULTURE

## 2015-02-03 ENCOUNTER — Telehealth: Payer: Self-pay | Admitting: Adult Health

## 2015-02-03 ENCOUNTER — Ambulatory Visit (HOSPITAL_COMMUNITY)
Admission: RE | Admit: 2015-02-03 | Discharge: 2015-02-03 | Disposition: A | Discharge status: Home / Self Care | Payer: Medicare Other | Source: Ambulatory Visit | Attending: Adult Health | Admitting: Adult Health

## 2015-02-03 DIAGNOSIS — N289 Disorder of kidney and ureter, unspecified: Secondary | ICD-10-CM | POA: Diagnosis not present

## 2015-02-03 DIAGNOSIS — Z87440 Personal history of urinary (tract) infections: Secondary | ICD-10-CM | POA: Diagnosis not present

## 2015-02-03 DIAGNOSIS — N281 Cyst of kidney, acquired: Secondary | ICD-10-CM | POA: Diagnosis not present

## 2015-02-03 DIAGNOSIS — R161 Splenomegaly, not elsewhere classified: Secondary | ICD-10-CM

## 2015-02-03 DIAGNOSIS — E119 Type 2 diabetes mellitus without complications: Secondary | ICD-10-CM | POA: Diagnosis not present

## 2015-02-03 DIAGNOSIS — R300 Dysuria: Secondary | ICD-10-CM | POA: Insufficient documentation

## 2015-02-03 DIAGNOSIS — M546 Pain in thoracic spine: Secondary | ICD-10-CM | POA: Insufficient documentation

## 2015-02-03 DIAGNOSIS — R319 Hematuria, unspecified: Secondary | ICD-10-CM | POA: Diagnosis present

## 2015-02-03 DIAGNOSIS — I10 Essential (primary) hypertension: Secondary | ICD-10-CM | POA: Insufficient documentation

## 2015-02-03 MED ORDER — SULFAMETHOXAZOLE-TRIMETHOPRIM 800-160 MG PO TABS: 1.0000 | ORAL_TABLET | Freq: Two times a day (BID) | ORAL | Status: DC

## 2015-02-03 NOTE — Telephone Encounter (Signed)
Pt aware of renal US, ?splenic mass, will get CT of abdomen, will rx septra ds for + e-coli on culture

## 2015-02-03 NOTE — Telephone Encounter (Signed)
CT of abdomen without contrast  10/20 at 4:45 at [facility]

## 2015-02-06 ENCOUNTER — Ambulatory Visit (HOSPITAL_COMMUNITY)
Admission: RE | Admit: 2015-02-06 | Discharge: 2015-02-06 | Disposition: A | Discharge status: Home / Self Care | Payer: Medicare Other | Source: Ambulatory Visit | Attending: Adult Health | Admitting: Adult Health

## 2015-02-06 DIAGNOSIS — I2510 Atherosclerotic heart disease of native coronary artery without angina pectoris: Secondary | ICD-10-CM | POA: Diagnosis not present

## 2015-02-06 DIAGNOSIS — R932 Abnormal findings on diagnostic imaging of liver and biliary tract: Secondary | ICD-10-CM | POA: Insufficient documentation

## 2015-02-06 DIAGNOSIS — R319 Hematuria, unspecified: Secondary | ICD-10-CM | POA: Diagnosis not present

## 2015-02-06 DIAGNOSIS — D7389 Other diseases of spleen: Secondary | ICD-10-CM | POA: Insufficient documentation

## 2015-02-06 DIAGNOSIS — R161 Splenomegaly, not elsewhere classified: Secondary | ICD-10-CM

## 2015-02-07 ENCOUNTER — Telehealth: Payer: Self-pay | Admitting: Adult Health

## 2015-02-07 NOTE — Telephone Encounter (Signed)
Pt aware of results, will talk with MD Monday and call her back

## 2015-02-10 ENCOUNTER — Telehealth: Payer: Self-pay | Admitting: Adult Health

## 2015-02-10 NOTE — Telephone Encounter (Signed)
Discussed with Dr Elonda Husky and called pt back,he said not to worry

## 2015-04-02 DIAGNOSIS — E119 Type 2 diabetes mellitus without complications: Secondary | ICD-10-CM | POA: Diagnosis not present

## 2015-04-02 DIAGNOSIS — Z79899 Other long term (current) drug therapy: Secondary | ICD-10-CM | POA: Diagnosis not present

## 2015-04-03 DIAGNOSIS — E1169 Type 2 diabetes mellitus with other specified complication: Secondary | ICD-10-CM | POA: Diagnosis not present

## 2015-04-03 DIAGNOSIS — Z6828 Body mass index (BMI) 28.0-28.9, adult: Secondary | ICD-10-CM | POA: Diagnosis not present

## 2015-04-03 DIAGNOSIS — M25512 Pain in left shoulder: Secondary | ICD-10-CM | POA: Diagnosis not present

## 2015-04-07 DIAGNOSIS — I10 Essential (primary) hypertension: Secondary | ICD-10-CM | POA: Diagnosis not present

## 2015-04-07 DIAGNOSIS — E1139 Type 2 diabetes mellitus with other diabetic ophthalmic complication: Secondary | ICD-10-CM | POA: Diagnosis not present

## 2015-04-07 DIAGNOSIS — Z6828 Body mass index (BMI) 28.0-28.9, adult: Secondary | ICD-10-CM | POA: Diagnosis not present

## 2015-05-20 DIAGNOSIS — H02032 Senile entropion of right lower eyelid: Secondary | ICD-10-CM | POA: Diagnosis not present

## 2015-06-04 DIAGNOSIS — H02002 Unspecified entropion of right lower eyelid: Secondary | ICD-10-CM | POA: Diagnosis not present

## 2015-06-04 DIAGNOSIS — H02032 Senile entropion of right lower eyelid: Secondary | ICD-10-CM | POA: Diagnosis not present

## 2015-06-25 ENCOUNTER — Ambulatory Visit (INDEPENDENT_AMBULATORY_CARE_PROVIDER_SITE_OTHER): Payer: Medicare Other | Admitting: Orthopaedic Surgery

## 2015-06-25 ENCOUNTER — Ambulatory Visit (INDEPENDENT_AMBULATORY_CARE_PROVIDER_SITE_OTHER): Payer: Medicare Other

## 2015-06-25 VITALS — BP 170/81 | HR 80 | Temp 97.7°F | Ht 63.0 in | Wt 165.6 lb

## 2015-06-25 DIAGNOSIS — M25511 Pain in right shoulder: Secondary | ICD-10-CM

## 2015-06-25 NOTE — Progress Notes (Signed)
Patient [id number] [name], female DOB:Jun 05, 1932, 82 y.o. [id number]  Chief Complaint  Patient presents with  . Follow-up    Bilateral shoulder pain right worse than left    HPI  [name] is a 82 y.o. female who has bilateral shoulder pain.  The right shoulder is more painful today.  She has no trauma. She has no redness. She has no paresthesias.    Shoulder Pain  The pain is present in the right shoulder. This is a chronic problem. The current episode started more than 1 year ago. The problem has been gradually worsening. The quality of the pain is described as dull and aching. The pain is at a severity of 4/10. The pain is moderate. She has tried acetaminophen, rest and heat for the symptoms. The treatment provided mild relief.    Body mass index is 29.34 kg/(m^2).   Review of Systems  Constitutional:       Patient has Diabetes Mellitus. Patient has hypertension. Patient does not have COPD or shortness of breath. Patient does not have BMI > 35. Patient does not have current smoking history  Musculoskeletal: Positive for joint swelling and arthralgias.    Past Medical History  Diagnosis Date  . Diabetes mellitus without complication ([place])   . Hypertension   . PONV (postoperative nausea and vomiting)   . Arthritis   . UTI (lower urinary tract infection) 11/02/2013  . Urinary urgency 12/25/2014  . Hematuria 12/25/2014  . Back pain 01/30/2015    Past Surgical History  Procedure Laterality Date  . Abdominal hysterectomy  1960s    with bladder tack up  . Rectocele repair  1985    [facility], [place]  . Breast surgery      benign, right  . Cataract extraction w/phaco  02/08/2012    Procedure: CATARACT EXTRACTION PHACO AND INTRAOCULAR LENS PLACEMENT (IOC);  Surgeon: Elta Guadeloupe T. Gershon Crane, MD;  Location: [facility];  Service: Ophthalmology;  Laterality: Left;  CDE:11.62    Family History  Problem Relation Age of Onset  . Diabetes Mother   . Diabetes Brother   . Diabetes Brother    . Heart attack Brother   . Cancer Brother     lung  . Cancer Brother     lung  . Birth defects Brother     passed away at age 87; hole in heart  . Emphysema Brother     Social History Social History  Substance Use Topics  . Smoking status: Never Smoker   . Smokeless tobacco: Never Used  . Alcohol Use: No    No Known Allergies  Current Outpatient Prescriptions  Medication Sig Dispense Refill  . acetaminophen (TYLENOL) 500 MG tablet Take 500 mg by mouth every 6 (six) hours as needed. For arthritis pain    . insulin glargine (LANTUS) 100 UNIT/ML injection Inject 30 Units into the skin daily.     Marland Kitchen losartan-hydrochlorothiazide (HYZAAR) 100-25 MG per tablet Take 1 tablet by mouth daily.    . metFORMIN (GLUCOPHAGE) 500 MG tablet Take 500 mg by mouth daily.    Marland Kitchen sulfamethoxazole-trimethoprim (BACTRIM DS,SEPTRA DS) 800-160 MG tablet Take 1 tablet by mouth 2 (two) times daily. 14 tablet 0   No current facility-administered medications for this visit.     Physical Exam  Blood pressure 170/81, pulse 80, temperature 97.7 F (36.5 C), height 5\' 3"  (1.6 m), weight 165 lb 9.6 oz (75.116 kg).  Constitutional: overall normal hygiene, normal nutrition, well developed, normal grooming, normal body habitus. Assistive  device:none  Musculoskeletal: gait and station Limp none, muscle tone and strength are normal, no tremors or atrophy is present.  .  Neurological: coordination overall normal.  Deep tendon reflex/nerve stretch intact.  Sensation normal.  Cranial nerves II-XII intact.   Skin:   normal overall no scars, lesions, ulcers or rashes. No psoriasis.  Psychiatric: Alert and oriented x 3.  Recent memory intact, remote memory unclear.  Normal mood and affect. Well groomed.  Good eye contact.  Cardiovascular: overall no swelling, no varicosities, no edema bilaterally, normal temperatures of the legs and arms, no clubbing, cyanosis and good capillary refill.  Lymphatic: palpation is  normal. Examination of right Upper Extremity is done.  Inspection:   Overall:  Elbow non-tender without crepitus or defects, forearm non-tender without crepitus or defects, wrist non-tender without crepitus or defects, hand non-tender.    Shoulder: with glenohumeral joint tenderness, without effusion.   Upper arm: without swelling and tenderness   Range of motion:   Overall:  Full range of motion of the elbow, full range of motion of wrist and full range of motion in fingers.   Shoulder:  right  160  degrees forward flexion; 145 degrees abduction; 35 degrees internal rotation, 35 degrees external rotation, 25 degrees extension, 40 degrees adduction.   Stability:   Overall:  Shoulder, elbow and wrist stable   Strength and Tone:   Overall full shoulder muscles strength, full upper arm strength and normal upper arm bulk and tone.  I have gone over exercises for her.   She only takes Tylenol and I went over dosing for that.  Her diabetes is well controlled.  She monitors it closely.  Her hypertension is also well controlled. She has no distal edema.  X-rays were taken of the right shoulder, see separate report.  The patient has been educated about the nature of the problem(s) and counseled on treatment options.  The patient appeared to understand what I have discussed and is in agreement with it.  PLAN Call if any problems.  Precautions discussed.  Continue current medications.   Return to clinic 3 months

## 2015-08-15 ENCOUNTER — Encounter (INDEPENDENT_AMBULATORY_CARE_PROVIDER_SITE_OTHER): Payer: Self-pay | Admitting: *Deleted

## 2015-09-01 DIAGNOSIS — E1140 Type 2 diabetes mellitus with diabetic neuropathy, unspecified: Secondary | ICD-10-CM | POA: Diagnosis not present

## 2015-09-01 DIAGNOSIS — L89892 Pressure ulcer of other site, stage 2: Secondary | ICD-10-CM | POA: Diagnosis not present

## 2015-09-01 DIAGNOSIS — E1151 Type 2 diabetes mellitus with diabetic peripheral angiopathy without gangrene: Secondary | ICD-10-CM | POA: Diagnosis not present

## 2015-09-01 DIAGNOSIS — M79671 Pain in right foot: Secondary | ICD-10-CM | POA: Diagnosis not present

## 2015-09-09 DIAGNOSIS — E119 Type 2 diabetes mellitus without complications: Secondary | ICD-10-CM | POA: Diagnosis not present

## 2015-09-09 DIAGNOSIS — Z961 Presence of intraocular lens: Secondary | ICD-10-CM | POA: Diagnosis not present

## 2015-09-09 DIAGNOSIS — H353131 Nonexudative age-related macular degeneration, bilateral, early dry stage: Secondary | ICD-10-CM | POA: Diagnosis not present

## 2015-09-09 DIAGNOSIS — Z794 Long term (current) use of insulin: Secondary | ICD-10-CM | POA: Diagnosis not present

## 2015-09-16 DIAGNOSIS — M79671 Pain in right foot: Secondary | ICD-10-CM | POA: Diagnosis not present

## 2015-09-16 DIAGNOSIS — E1151 Type 2 diabetes mellitus with diabetic peripheral angiopathy without gangrene: Secondary | ICD-10-CM | POA: Diagnosis not present

## 2015-09-16 DIAGNOSIS — E1140 Type 2 diabetes mellitus with diabetic neuropathy, unspecified: Secondary | ICD-10-CM | POA: Diagnosis not present

## 2015-09-16 DIAGNOSIS — L89892 Pressure ulcer of other site, stage 2: Secondary | ICD-10-CM | POA: Diagnosis not present

## 2015-09-24 ENCOUNTER — Encounter: Payer: Self-pay | Admitting: Orthopaedic Surgery

## 2015-09-24 ENCOUNTER — Ambulatory Visit (INDEPENDENT_AMBULATORY_CARE_PROVIDER_SITE_OTHER): Payer: Medicare Other | Admitting: Orthopaedic Surgery

## 2015-09-24 VITALS — BP 177/74 | HR 73 | Temp 97.4°F | Ht 63.25 in | Wt 163.6 lb

## 2015-09-24 DIAGNOSIS — M6281 Muscle weakness (generalized): Secondary | ICD-10-CM

## 2015-09-24 DIAGNOSIS — M25511 Pain in right shoulder: Secondary | ICD-10-CM

## 2015-09-24 NOTE — Progress Notes (Signed)
Patient [id number] [name], female DOB:May 18, 1932, 82 y.o. [id number]  Chief Complaint  Patient presents with  . Follow-up    Right shoulder    HPI  [name] is a 82 y.o. female who has chronic right shoulder pain.  She has no new trauma.  She has no paresthesias.  She is doing her exercises for the shoulder.  She has no redness.  She is stable.  HPI  Body mass index is 28.74 kg/(m^2).  ROS  Review of Systems  Constitutional:       Patient has Diabetes Mellitus. Patient has hypertension. Patient does not have COPD or shortness of breath. Patient does not have BMI > 35. Patient does not have current smoking history  Musculoskeletal: Positive for joint swelling and arthralgias.    Past Medical History  Diagnosis Date  . Diabetes mellitus without complication ([place])   . Hypertension   . PONV (postoperative nausea and vomiting)   . Arthritis   . UTI (lower urinary tract infection) 11/02/2013  . Urinary urgency 12/25/2014  . Hematuria 12/25/2014  . Back pain 01/30/2015    Past Surgical History  Procedure Laterality Date  . Abdominal hysterectomy  1960s    with bladder tack up  . Rectocele repair  1985    [facility], [place]  . Breast surgery      benign, right  . Cataract extraction w/phaco  02/08/2012    Procedure: CATARACT EXTRACTION PHACO AND INTRAOCULAR LENS PLACEMENT (IOC);  Surgeon: Elta Guadeloupe T. Gershon Crane, MD;  Location: [facility];  Service: Ophthalmology;  Laterality: Left;  CDE:11.62    Family History  Problem Relation Age of Onset  . Diabetes Mother   . Diabetes Brother   . Diabetes Brother   . Heart attack Brother   . Cancer Brother     lung  . Cancer Brother     lung  . Birth defects Brother     passed away at age 1; hole in heart  . Emphysema Brother     Social History Social History  Substance Use Topics  . Smoking status: Never Smoker   . Smokeless tobacco: Never Used  . Alcohol Use: No    No Known Allergies  Current Outpatient Prescriptions   Medication Sig Dispense Refill  . acetaminophen (TYLENOL) 500 MG tablet Take 500 mg by mouth every 6 (six) hours as needed. For arthritis pain    . insulin glargine (LANTUS) 100 UNIT/ML injection Inject 30 Units into the skin daily.     Marland Kitchen losartan-hydrochlorothiazide (HYZAAR) 100-25 MG per tablet Take 1 tablet by mouth daily.    . metFORMIN (GLUCOPHAGE) 500 MG tablet Take 500 mg by mouth daily.    Marland Kitchen sulfamethoxazole-trimethoprim (BACTRIM DS,SEPTRA DS) 800-160 MG tablet Take 1 tablet by mouth 2 (two) times daily. 14 tablet 0   No current facility-administered medications for this visit.     Physical Exam  Blood pressure 177/74, pulse 73, temperature 97.4 F (36.3 C), height 5' 3.25" (1.607 m), weight 163 lb 9.6 oz (74.208 kg).  Constitutional: overall normal hygiene, normal nutrition, well developed, normal grooming, normal body habitus. Assistive device:none  Musculoskeletal: gait and station Limp none, muscle tone and strength are normal, no tremors or atrophy is present.  .  Neurological: coordination overall normal.  Deep tendon reflex/nerve stretch intact.  Sensation normal.  Cranial nerves II-XII intact.   Skin:   normal overall no scars, lesions, ulcers or rashes. No psoriasis.  Psychiatric: Alert and oriented x 3.  Recent memory intact,  remote memory unclear.  Normal mood and affect. Well groomed.  Good eye contact.  Cardiovascular: overall no swelling, no varicosities, no edema bilaterally, normal temperatures of the legs and arms, no clubbing, cyanosis and good capillary refill.  Lymphatic: palpation is normal.  Examination of right Upper Extremity is done.  Inspection:   Overall:  Elbow non-tender without crepitus or defects, forearm non-tender without crepitus or defects, wrist non-tender without crepitus or defects, hand non-tender.    Shoulder: with glenohumeral joint tenderness, without effusion.   Upper arm: without swelling and tenderness   Range of  motion:   Overall:  Full range of motion of the elbow, full range of motion of wrist and full range of motion in fingers.   Shoulder:  right  145 degrees forward flexion; 120 degrees abduction; 35 degrees internal rotation, 35 degrees external rotation, 15 degrees extension, 40 degrees adduction.   Stability:   Overall:  Shoulder, elbow and wrist stable   Strength and Tone:   Overall full shoulder muscles strength, full upper arm strength and normal upper arm bulk and tone.   The patient has been educated about the nature of the problem(s) and counseled on treatment options.  The patient appeared to understand what I have discussed and is in agreement with it.  Encounter Diagnoses  Name Primary?  . Right shoulder pain Yes  . Muscle weakness (generalized)     PLAN Call if any problems.  Precautions discussed.  Continue current medications.   Return to clinic 3 months   Electronically Signed Sanjuana Kava, MD 6/7/20179:38 AM

## 2015-10-13 DIAGNOSIS — E1140 Type 2 diabetes mellitus with diabetic neuropathy, unspecified: Secondary | ICD-10-CM | POA: Diagnosis not present

## 2015-10-13 DIAGNOSIS — E1151 Type 2 diabetes mellitus with diabetic peripheral angiopathy without gangrene: Secondary | ICD-10-CM | POA: Diagnosis not present

## 2015-10-13 DIAGNOSIS — M79671 Pain in right foot: Secondary | ICD-10-CM | POA: Diagnosis not present

## 2015-10-13 DIAGNOSIS — L89892 Pressure ulcer of other site, stage 2: Secondary | ICD-10-CM | POA: Diagnosis not present

## 2015-10-17 DIAGNOSIS — E119 Type 2 diabetes mellitus without complications: Secondary | ICD-10-CM | POA: Diagnosis not present

## 2015-10-17 DIAGNOSIS — E785 Hyperlipidemia, unspecified: Secondary | ICD-10-CM | POA: Diagnosis not present

## 2015-10-17 DIAGNOSIS — Z79899 Other long term (current) drug therapy: Secondary | ICD-10-CM | POA: Diagnosis not present

## 2015-10-17 DIAGNOSIS — I10 Essential (primary) hypertension: Secondary | ICD-10-CM | POA: Diagnosis not present

## 2015-10-24 DIAGNOSIS — N390 Urinary tract infection, site not specified: Secondary | ICD-10-CM | POA: Diagnosis not present

## 2015-10-24 DIAGNOSIS — E1139 Type 2 diabetes mellitus with other diabetic ophthalmic complication: Secondary | ICD-10-CM | POA: Diagnosis not present

## 2015-11-18 ENCOUNTER — Ambulatory Visit (INDEPENDENT_AMBULATORY_CARE_PROVIDER_SITE_OTHER): Payer: Medicare Other | Admitting: Internal Medicine

## 2015-11-18 ENCOUNTER — Encounter (INDEPENDENT_AMBULATORY_CARE_PROVIDER_SITE_OTHER): Payer: Self-pay | Admitting: Internal Medicine

## 2015-11-18 VITALS — BP 132/72 | HR 68 | Temp 97.6°F | Resp 18 | Ht 63.5 in | Wt 163.5 lb

## 2015-11-18 DIAGNOSIS — Z86010 Personal history of colonic polyps: Secondary | ICD-10-CM | POA: Diagnosis not present

## 2015-11-18 NOTE — Progress Notes (Signed)
Presenting complaint;  History of colonic adenomas. Wants to discuss whether or not she should undergo colonoscopy.  Subjective:  [name] is 82 year old Caucasian female who has history of colonic adenomas but none were found on her last colonoscopy of April 2012. She is not having any problems and is inclined not to undergo colonoscopy. She denies abdominal pain melena or rectal bleeding. She has at least one bowel movement daily. She has good appetite and has not lost the weight. She states she walks about 1 mile every day. She has noted it helps alleviate joint pains and stiffness. She states she lost 37 pounds when she had all of her teeth pulled and before she could get dentures. She states since then she has gained 3 pounds. She has heartburn no more than 4-6 times per month. This occurs when she eats greasy food. He uses Pepcid couple of times a month. She had complete blood count at the time of her physical exam and she states her hemoglobin was normal. She states she is still working but now she works 4 hours every week. She works up in [employer]. Family history is negative for CRC in first or second-degree relatives.    Current Medications: Outpatient Encounter Prescriptions as of 11/18/2015  Medication Sig  . acetaminophen (TYLENOL) 500 MG tablet Take 500 mg by mouth every 6 (six) hours as needed. For arthritis pain  . insulin glargine (LANTUS) 100 UNIT/ML injection Inject 35 Units into the skin daily.   Marland Kitchen losartan-hydrochlorothiazide (HYZAAR) 100-25 MG per tablet Take 1 tablet by mouth daily.  . metFORMIN (GLUCOPHAGE) 500 MG tablet Take 500 mg by mouth daily.  . [DISCONTINUED] sulfamethoxazole-trimethoprim (BACTRIM DS,SEPTRA DS) 800-160 MG tablet Take 1 tablet by mouth 2 (two) times daily.   No facility-administered encounter medications on file as of 11/18/2015.      Objective: Blood pressure 132/72, pulse 68, temperature 97.6 F (36.4 C), temperature source Oral, resp. rate 18,  height 5' 3.5" (1.613 m), weight 163 lb 8 oz (74.2 kg). Patient is alert and in no acute distress. She has mild hearing impairment. Conjunctiva is pink. Sclera is nonicteric Oropharyngeal mucosa is normal. No neck masses or thyromegaly noted. Cardiac exam with regular rhythm normal S1 and S2. Faint systolic ejection murmur noted at L LSB and aortic area. Lungs are clear to auscultation. Abdomen is symmetrical soft and nontender without organomegaly or masses. No LE edema or clubbing noted.   Assessment:  #1. History of colonic adenomas. She had no adenomas on her last exam in April 2012 and I believe she had one small adenoma on her prior exam in 2007. Therefore her risk for CRC is close to average. I would agree with holding off future colonoscopies unless she has symptoms.  Plan:  Patient will call if she experiences rectal bleeding or change in her bowel habits. Office visit on as-needed basis.

## 2015-11-18 NOTE — Patient Instructions (Signed)
Call if you have rectal bleeding or change in your bowel habits.

## 2015-12-24 ENCOUNTER — Ambulatory Visit (INDEPENDENT_AMBULATORY_CARE_PROVIDER_SITE_OTHER): Payer: Medicare Other | Admitting: Orthopaedic Surgery

## 2015-12-24 ENCOUNTER — Encounter: Payer: Self-pay | Admitting: Orthopaedic Surgery

## 2015-12-24 VITALS — BP 161/72 | HR 81 | Temp 97.7°F | Ht 63.0 in | Wt 164.0 lb

## 2015-12-24 DIAGNOSIS — M545 Low back pain, unspecified: Secondary | ICD-10-CM

## 2015-12-24 DIAGNOSIS — M25511 Pain in right shoulder: Secondary | ICD-10-CM

## 2015-12-24 NOTE — Progress Notes (Signed)
Patient [id number] [name], female DOB:December 27, 1932, 82 y.o. [id number]  Chief Complaint  Patient presents with  . Follow-up    Right Shoulder pain    HPI  [name] is a 82 y.o. female who has chronic right and left shoulder pain as well as lower back pain.  She is doing OK but has more back pain recently.  She has no new trauma, no paresthesias.  She cannot take NSAIDs.  She uses heat and Aspercreme.  I have advised to get the pain patches and try them.  She is active.  Her shoulders have no paresthesias.  She is doing her exercises daily. HPI  Body mass index is 29.05 kg/m.  ROS  Review of Systems  Constitutional:       Patient has Diabetes Mellitus. Patient has hypertension. Patient does not have COPD or shortness of breath. Patient does not have BMI > 35. Patient does not have current smoking history  HENT: Negative for congestion.   Respiratory: Negative for cough and shortness of breath.   Cardiovascular: Negative for leg swelling.  Endocrine: Positive for cold intolerance.  Musculoskeletal: Positive for arthralgias, back pain and joint swelling.  Allergic/Immunologic: Positive for environmental allergies.    Past Medical History:  Diagnosis Date  . Arthritis   . Back pain 01/30/2015  . Diabetes mellitus without complication ([place])   . Hematuria 12/25/2014  . Hypertension   . PONV (postoperative nausea and vomiting)   . Urinary urgency 12/25/2014  . UTI (lower urinary tract infection) 11/02/2013    Past Surgical History:  Procedure Laterality Date  . ABDOMINAL HYSTERECTOMY  1960s   with bladder tack up  . BREAST SURGERY     benign, right  . CATARACT EXTRACTION W/PHACO  02/08/2012   Procedure: CATARACT EXTRACTION PHACO AND INTRAOCULAR LENS PLACEMENT (IOC);  Surgeon: Elta Guadeloupe T. Gershon Crane, MD;  Location: [facility];  Service: Ophthalmology;  Laterality: Left;  CDE:11.62  . RECTOCELE REPAIR  1985   [facility], [place]    Family History  Problem Relation Age of Onset  .  Diabetes Mother   . Diabetes Brother   . Diabetes Brother   . Heart attack Brother   . Cancer Brother     lung  . Cancer Brother     lung  . Birth defects Brother     passed away at age 69; hole in heart  . Emphysema Brother     Social History Social History  Substance Use Topics  . Smoking status: Never Smoker  . Smokeless tobacco: Never Used  . Alcohol use No    No Known Allergies  Current Outpatient Prescriptions  Medication Sig Dispense Refill  . acetaminophen (TYLENOL) 500 MG tablet Take 500 mg by mouth every 6 (six) hours as needed. For arthritis pain    . insulin glargine (LANTUS) 100 UNIT/ML injection Inject 35 Units into the skin daily.     Marland Kitchen losartan-hydrochlorothiazide (HYZAAR) 100-25 MG per tablet Take 1 tablet by mouth daily.    . metFORMIN (GLUCOPHAGE) 500 MG tablet Take 500 mg by mouth daily.     No current facility-administered medications for this visit.      Physical Exam  Blood pressure (!) 161/72, pulse 81, temperature 97.7 F (36.5 C), height 5\' 3"  (1.6 m), weight 164 lb (74.4 kg).  Constitutional: overall normal hygiene, normal nutrition, well developed, normal grooming, normal body habitus. Assistive device:none  Musculoskeletal: gait and station Limp none, muscle tone and strength are normal, no tremors or atrophy is  present.  .  Neurological: coordination overall normal.  Deep tendon reflex/nerve stretch intact.  Sensation normal.  Cranial nerves II-XII intact.   Skin:   Normal overall no scars, lesions, ulcers or rashes. No psoriasis.  Psychiatric: Alert and oriented x 3.  Recent memory intact, remote memory unclear.  Normal mood and affect. Well groomed.  Good eye contact.  Cardiovascular: overall no swelling, no varicosities, no edema bilaterally, normal temperatures of the legs and arms, no clubbing, cyanosis and good capillary refill.  Lymphatic: palpation is normal.  Examination of bilaterally Upper Extremity is  done.  Inspection:   Overall:  Elbow non-tender without crepitus or defects, forearm non-tender without crepitus or defects, wrist non-tender without crepitus or defects, hand non-tender.    Shoulder: with glenohumeral joint tenderness, without effusion.   Upper arm: without swelling and tenderness   Range of motion:   Overall:  Full range of motion of the elbow, full range of motion of wrist and full range of motion in fingers.   Shoulder:  bilaterally  180 degrees forward flexion; 165 degrees abduction; 35 degrees internal rotation, 35 degrees external rotation, 20 degrees extension, 40 degrees adduction.   Stability:   Overall:  Shoulder, elbow and wrist stable   Strength and Tone:   Overall full shoulder muscles strength, full upper arm strength and normal upper arm bulk and tone.  Spine/Pelvis examination:  Inspection:  Overall, sacoiliac joint benign and hips nontender; without crepitus or defects.   Thoracic spine inspection: Alignment normal without kyphosis present   Lumbar spine inspection:  Alignment  with normal lumbar lordosis, without scoliosis apparent.   Thoracic spine palpation:  without tenderness of spinal processes   Lumbar spine palpation: with tenderness of lumbar area; without tightness of lumbar muscles    Range of Motion:   Lumbar flexion, forward flexion is 40 without pain or tenderness    Lumbar extension is 10 without pain or tenderness   Left lateral bend is Normal  without pain or tenderness   Right lateral bend is Normal without pain or tenderness   Straight leg raising is Normal   Strength & tone: Normal   Stability overall normal stability   The patient has been educated about the nature of the problem(s) and counseled on treatment options.  The patient appeared to understand what I have discussed and is in agreement with it.  Encounter Diagnoses  Name Primary?  . Midline low back pain without sciatica Yes  . Right shoulder pain      PLAN Call if any problems.  Precautions discussed.  Continue current medications.   Return to clinic 3 months  Electronically Signed Sanjuana Kava, MD 9/6/20178:48 AM

## 2016-02-18 DIAGNOSIS — I10 Essential (primary) hypertension: Secondary | ICD-10-CM | POA: Diagnosis not present

## 2016-02-18 DIAGNOSIS — E785 Hyperlipidemia, unspecified: Secondary | ICD-10-CM | POA: Diagnosis not present

## 2016-02-18 DIAGNOSIS — M1990 Unspecified osteoarthritis, unspecified site: Secondary | ICD-10-CM | POA: Diagnosis not present

## 2016-02-18 DIAGNOSIS — Z79899 Other long term (current) drug therapy: Secondary | ICD-10-CM | POA: Diagnosis not present

## 2016-02-18 DIAGNOSIS — E119 Type 2 diabetes mellitus without complications: Secondary | ICD-10-CM | POA: Diagnosis not present

## 2016-02-26 DIAGNOSIS — I10 Essential (primary) hypertension: Secondary | ICD-10-CM | POA: Diagnosis not present

## 2016-02-26 DIAGNOSIS — N183 Chronic kidney disease, stage 3 (moderate): Secondary | ICD-10-CM | POA: Diagnosis not present

## 2016-02-26 DIAGNOSIS — E1139 Type 2 diabetes mellitus with other diabetic ophthalmic complication: Secondary | ICD-10-CM | POA: Diagnosis not present

## 2016-02-26 DIAGNOSIS — Z23 Encounter for immunization: Secondary | ICD-10-CM | POA: Diagnosis not present

## 2016-03-24 ENCOUNTER — Ambulatory Visit: Payer: Medicare Other | Admitting: Orthopaedic Surgery

## 2016-06-23 DIAGNOSIS — E119 Type 2 diabetes mellitus without complications: Secondary | ICD-10-CM | POA: Diagnosis not present

## 2016-06-23 DIAGNOSIS — M1990 Unspecified osteoarthritis, unspecified site: Secondary | ICD-10-CM | POA: Diagnosis not present

## 2016-06-23 DIAGNOSIS — E785 Hyperlipidemia, unspecified: Secondary | ICD-10-CM | POA: Diagnosis not present

## 2016-06-23 DIAGNOSIS — I10 Essential (primary) hypertension: Secondary | ICD-10-CM | POA: Diagnosis not present

## 2016-06-30 DIAGNOSIS — I10 Essential (primary) hypertension: Secondary | ICD-10-CM | POA: Diagnosis not present

## 2016-06-30 DIAGNOSIS — N183 Chronic kidney disease, stage 3 (moderate): Secondary | ICD-10-CM | POA: Diagnosis not present

## 2016-06-30 DIAGNOSIS — E1139 Type 2 diabetes mellitus with other diabetic ophthalmic complication: Secondary | ICD-10-CM | POA: Diagnosis not present

## 2016-07-05 IMAGING — US US RENAL
1 series · 13 of 25 positions shown · non-contrast
Comparison: Abdominal ultrasound 06/10/2006, renal ultrasound
07/19/2003

CLINICAL DATA: Hematuria, painful urination, symptoms for 1 week,
thoracic back pain to LEFT, diabetes mellitus, hypertension, past
history of UTI

EXAM:
RENAL / URINARY TRACT ULTRASOUND COMPLETE

[Series 1: us renal · 0.16mm/px · 13 of 82 slices shown]
[im 1/82]
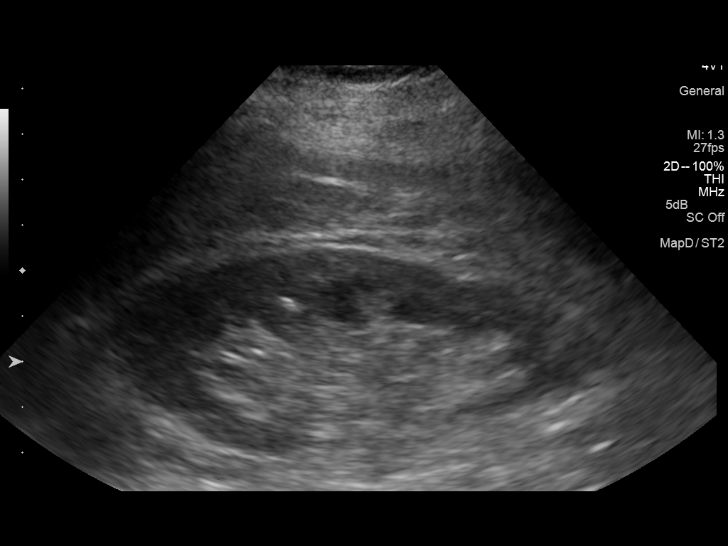
[im 7/82]
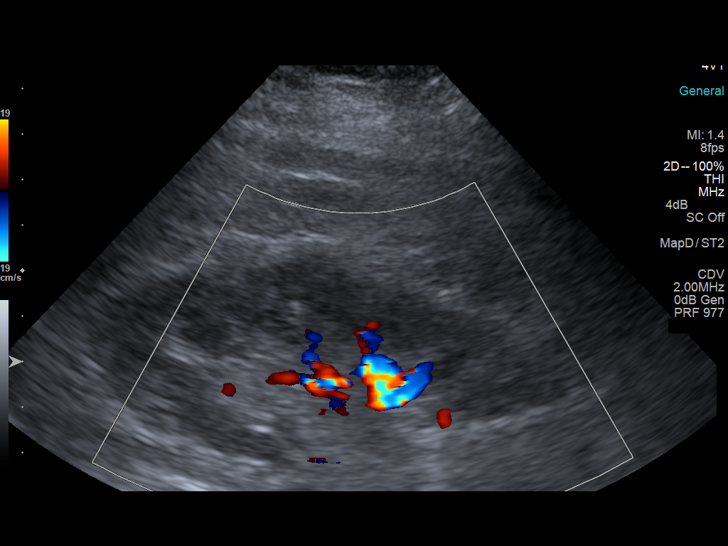
[im 14/82]
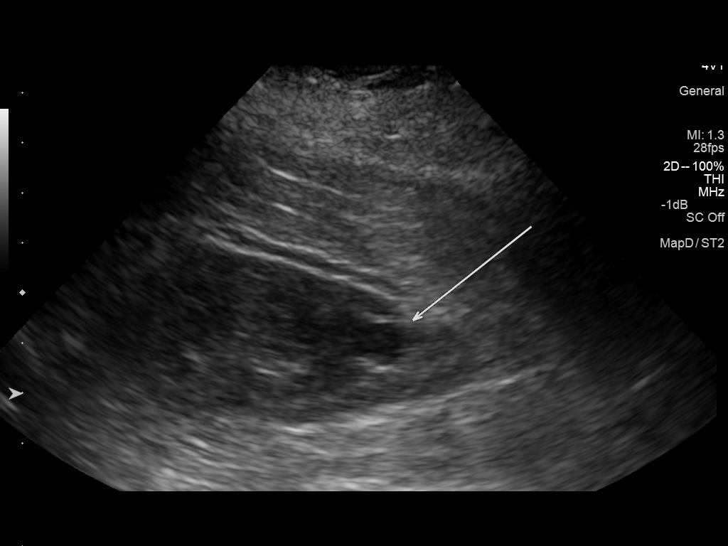
[im 21/82]
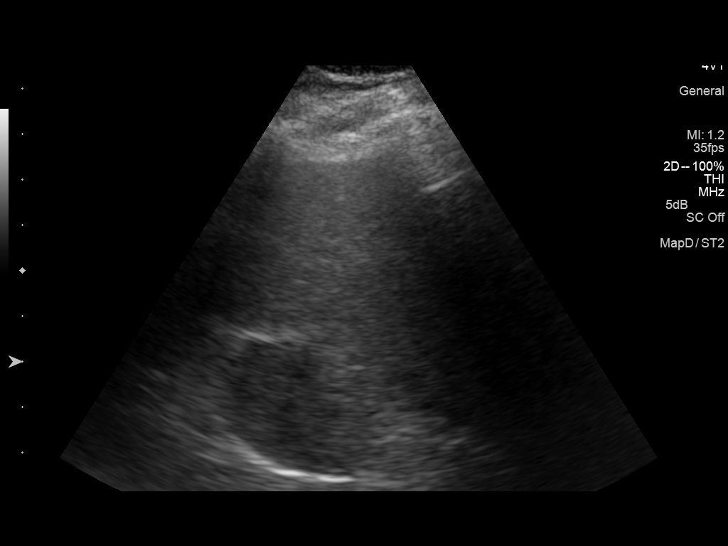
[im 28/82]
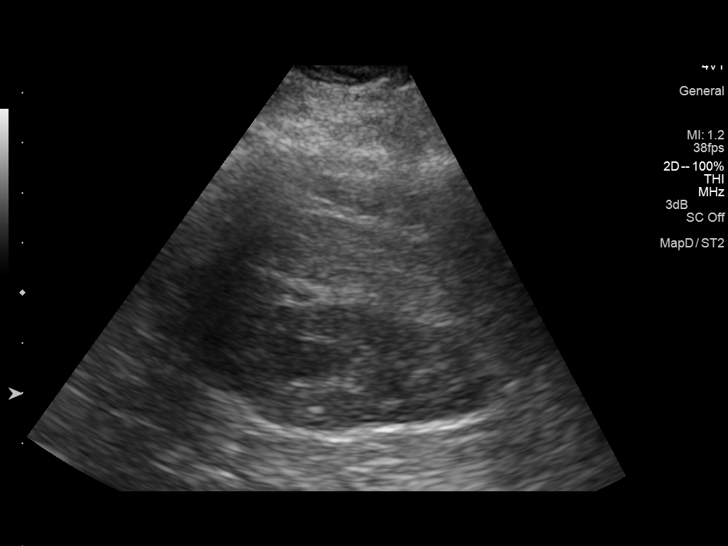
[im 34/82]
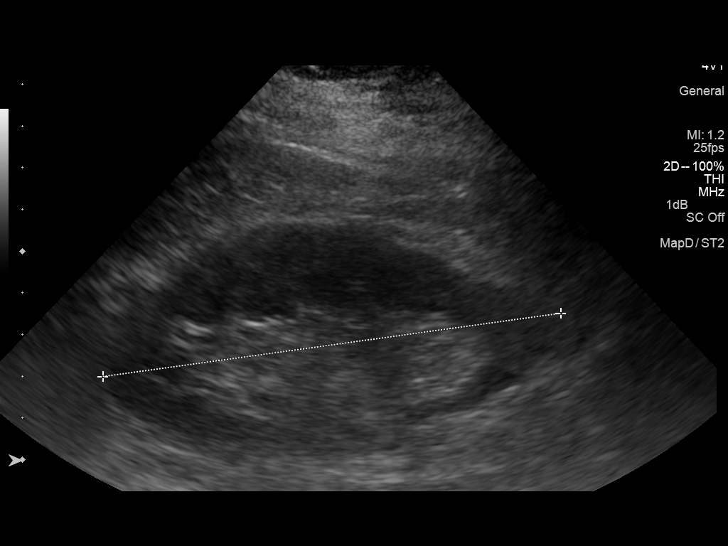
[im 41/82]
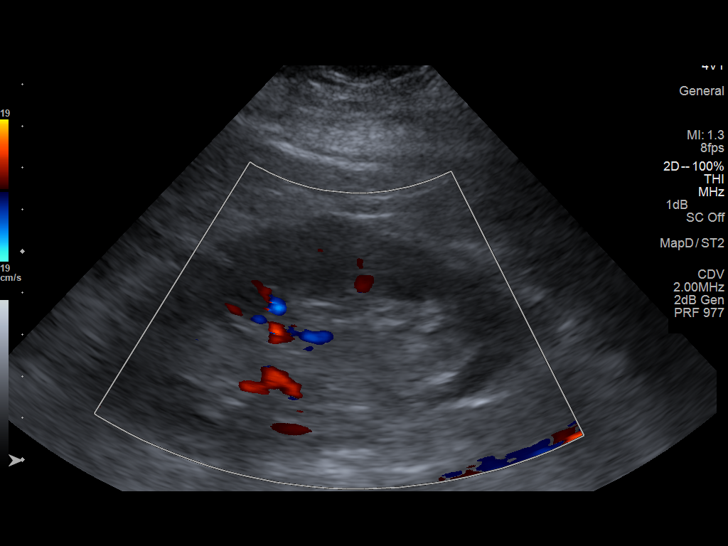
[im 48/82]
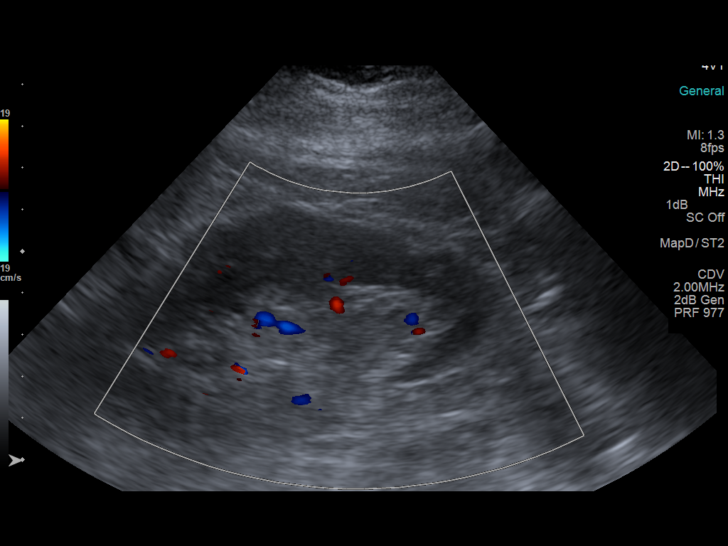
[im 55/82]
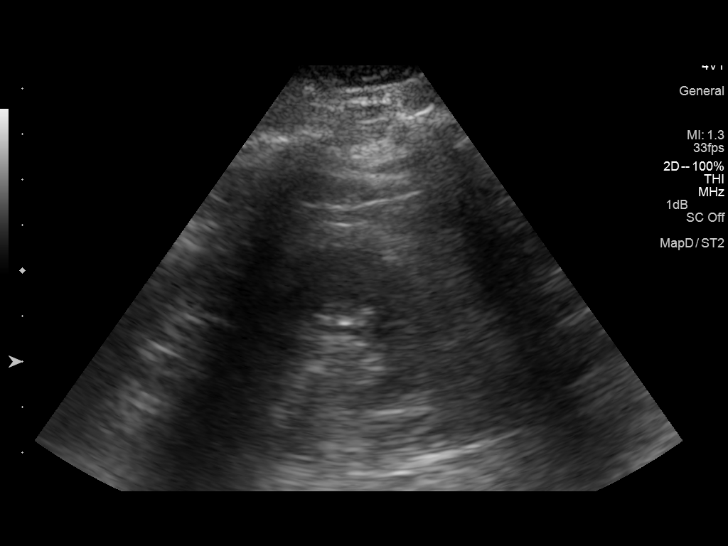
[im 61/82]
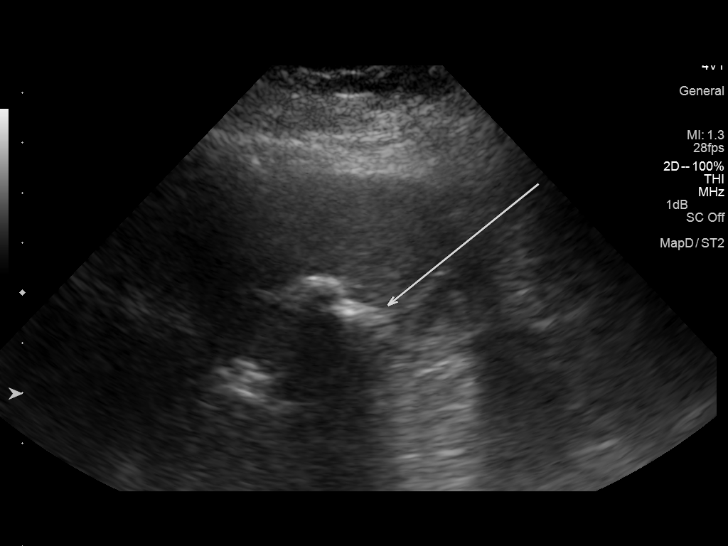
[im 68/82]
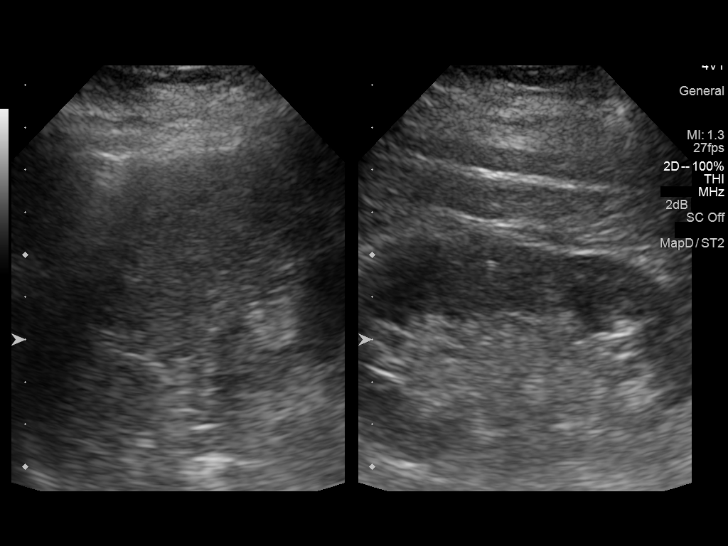
[im 75/82]
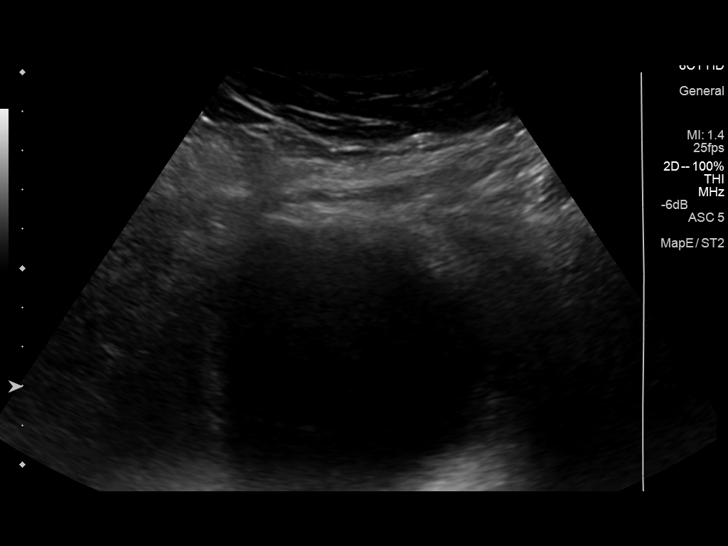
[im 82/82]
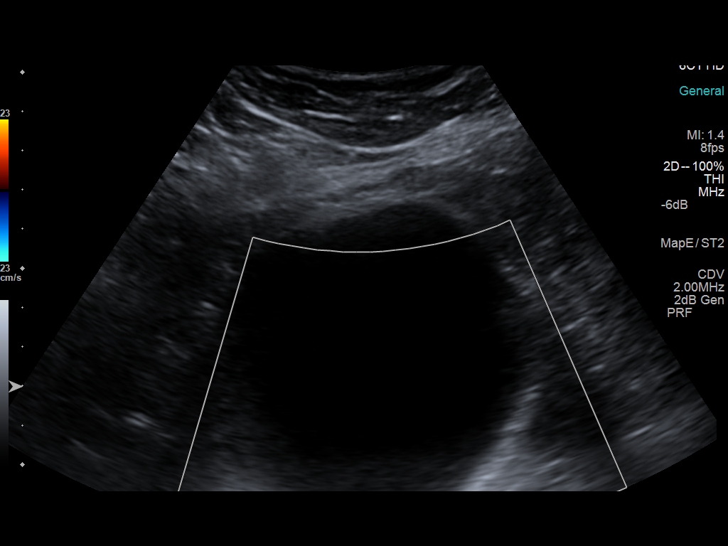

[13 of 25 positions shown; findings below may reference images not displayed]

FINDINGS: Right Kidney:

Length: 11.1 cm. Age-related cortical thinning. Slightly increased
cortical echogenicity. Tiny cyst inferior pole 11 x 9 x 10 mm. No
additional mass, hydronephrosis or shadowing calcification.

Left Kidney:

Length: 11.1 cm. Age-related cortical thinning. Minimally increased
cortical echogenicity. No mass, hydronephrosis or shadowing
calcification.

Bladder:

Normal appearance.  Ureteral jets were not visualized.

Incidentally noted echogenic focus with shadowing at central spleen
near hilum question calcified splenic mass or splenic granulomata,
at least 13 mm diameter.
IMPRESSION: Medical renal disease changes of both kidneys.

Tiny RIGHT renal cyst.

No additional urinary tract sonographic abnormalities identified.

Question calcified splenic mass or granuloma centrally within spleen
near hilum, not identified on prior abdominal ultrasound, splenic
artery aneurysm not completely excluded ; noncontrast CT of the
abdomen recommended to evaluate.

## 2016-09-14 DIAGNOSIS — E119 Type 2 diabetes mellitus without complications: Secondary | ICD-10-CM | POA: Diagnosis not present

## 2016-09-14 DIAGNOSIS — H524 Presbyopia: Secondary | ICD-10-CM | POA: Diagnosis not present

## 2016-09-14 DIAGNOSIS — H52203 Unspecified astigmatism, bilateral: Secondary | ICD-10-CM | POA: Diagnosis not present

## 2016-09-14 DIAGNOSIS — Z961 Presence of intraocular lens: Secondary | ICD-10-CM | POA: Diagnosis not present

## 2016-09-14 DIAGNOSIS — Z794 Long term (current) use of insulin: Secondary | ICD-10-CM | POA: Diagnosis not present

## 2016-10-04 ENCOUNTER — Ambulatory Visit (INDEPENDENT_AMBULATORY_CARE_PROVIDER_SITE_OTHER): Payer: Medicare Other | Admitting: Adult Health

## 2016-10-04 ENCOUNTER — Encounter (INDEPENDENT_AMBULATORY_CARE_PROVIDER_SITE_OTHER): Payer: Self-pay

## 2016-10-04 ENCOUNTER — Encounter: Payer: Self-pay | Admitting: Adult Health

## 2016-10-04 VITALS — BP 142/84 | HR 80 | Ht 63.0 in | Wt 167.0 lb

## 2016-10-04 DIAGNOSIS — R319 Hematuria, unspecified: Secondary | ICD-10-CM | POA: Diagnosis not present

## 2016-10-04 DIAGNOSIS — R300 Dysuria: Secondary | ICD-10-CM | POA: Insufficient documentation

## 2016-10-04 DIAGNOSIS — N390 Urinary tract infection, site not specified: Secondary | ICD-10-CM | POA: Diagnosis not present

## 2016-10-04 LAB — POCT URINALYSIS DIPSTICK
KETONES UA: NEGATIVE
Nitrite, UA: POSITIVE
PROTEIN UA: NEGATIVE

## 2016-10-04 MED ORDER — SULFAMETHOXAZOLE-TRIMETHOPRIM 800-160 MG PO TABS
1.0000 | ORAL_TABLET | Freq: Two times a day (BID) | ORAL | 0 refills | Status: DC
Start: 2016-10-04 — End: 2017-07-07

## 2016-10-04 NOTE — Progress Notes (Signed)
Subjective:     Patient ID: [name], female   DOB: [birth year], 83 y.o.   MRN: [id number]  HPI [name] is a 83 year old white female in complaining of burning with urination last week and urine has odor and is cloudy.Last UTI 01/2015. She says she had brother to die last week and couldn't get her.   Review of Systems Had burning with urination, started last week Urine has odor and is cloudy Reviewed past medical,surgical, social and family history. Reviewed medications and allergies.      Objective:   Physical Exam BP (!) 142/84 (BP Location: Right Arm, Patient Position: Sitting, Cuff Size: Normal)   Pulse 80   Ht 5\' 3"  (1.6 m)   Wt 167 lb (75.8 kg)   BMI 29.58 kg/m Urine dipstick: 1+glucose, trace blood,+nitrates,2+leuks. Skin warm and dry, bladder, non tender, and no CVAT.    Assessment:     1. Urinary tract infection with hematuria, site unspecified   2. Burning with urination       Plan:     UA C&S sent Push water Rx septra ds 1 bid x 7 days

## 2016-10-05 LAB — URINALYSIS, ROUTINE W REFLEX MICROSCOPIC
BILIRUBIN UA: NEGATIVE
Ketones, UA: NEGATIVE
Nitrite, UA: NEGATIVE
PH UA: 6.5 (ref 5.0–7.5)
Protein, UA: NEGATIVE
RBC UA: NEGATIVE
Specific Gravity, UA: 1.011 (ref 1.005–1.030)
Urobilinogen, Ur: 0.2 mg/dL (ref 0.2–1.0)

## 2016-10-05 LAB — MICROSCOPIC EXAMINATION
CASTS: NONE SEEN /LPF
RBC, UA: NONE SEEN /hpf (ref 0–?)
WBC, UA: >30 /hpf — AB (ref 0–?)

## 2016-10-06 LAB — URINE CULTURE

## 2016-10-07 ENCOUNTER — Telehealth: Payer: Self-pay | Admitting: Adult Health

## 2016-10-07 NOTE — Telephone Encounter (Signed)
Pt aware of urine culture and that septra ds should take care of it.

## 2016-10-21 DIAGNOSIS — E1129 Type 2 diabetes mellitus with other diabetic kidney complication: Secondary | ICD-10-CM | POA: Diagnosis not present

## 2016-10-21 DIAGNOSIS — N183 Chronic kidney disease, stage 3 (moderate): Secondary | ICD-10-CM | POA: Diagnosis not present

## 2016-10-21 DIAGNOSIS — Z79899 Other long term (current) drug therapy: Secondary | ICD-10-CM | POA: Diagnosis not present

## 2016-10-28 DIAGNOSIS — E1139 Type 2 diabetes mellitus with other diabetic ophthalmic complication: Secondary | ICD-10-CM | POA: Diagnosis not present

## 2016-10-28 DIAGNOSIS — I10 Essential (primary) hypertension: Secondary | ICD-10-CM | POA: Diagnosis not present

## 2016-10-28 DIAGNOSIS — N183 Chronic kidney disease, stage 3 (moderate): Secondary | ICD-10-CM | POA: Diagnosis not present

## 2016-12-27 DIAGNOSIS — C44622 Squamous cell carcinoma of skin of right upper limb, including shoulder: Secondary | ICD-10-CM | POA: Diagnosis not present

## 2016-12-27 DIAGNOSIS — L820 Inflamed seborrheic keratosis: Secondary | ICD-10-CM | POA: Diagnosis not present

## 2016-12-27 DIAGNOSIS — L570 Actinic keratosis: Secondary | ICD-10-CM | POA: Diagnosis not present

## 2016-12-27 DIAGNOSIS — X32XXXD Exposure to sunlight, subsequent encounter: Secondary | ICD-10-CM | POA: Diagnosis not present

## 2017-01-24 DIAGNOSIS — Z85828 Personal history of other malignant neoplasm of skin: Secondary | ICD-10-CM | POA: Diagnosis not present

## 2017-01-24 DIAGNOSIS — Z08 Encounter for follow-up examination after completed treatment for malignant neoplasm: Secondary | ICD-10-CM | POA: Diagnosis not present

## 2017-01-28 DIAGNOSIS — Z79899 Other long term (current) drug therapy: Secondary | ICD-10-CM | POA: Diagnosis not present

## 2017-01-28 DIAGNOSIS — N183 Chronic kidney disease, stage 3 (moderate): Secondary | ICD-10-CM | POA: Diagnosis not present

## 2017-01-28 DIAGNOSIS — E1129 Type 2 diabetes mellitus with other diabetic kidney complication: Secondary | ICD-10-CM | POA: Diagnosis not present

## 2017-02-03 DIAGNOSIS — Z23 Encounter for immunization: Secondary | ICD-10-CM | POA: Diagnosis not present

## 2017-02-03 DIAGNOSIS — R011 Cardiac murmur, unspecified: Secondary | ICD-10-CM | POA: Diagnosis not present

## 2017-02-03 DIAGNOSIS — I10 Essential (primary) hypertension: Secondary | ICD-10-CM | POA: Diagnosis not present

## 2017-02-03 DIAGNOSIS — E1139 Type 2 diabetes mellitus with other diabetic ophthalmic complication: Secondary | ICD-10-CM | POA: Diagnosis not present

## 2017-02-10 ENCOUNTER — Other Ambulatory Visit (HOSPITAL_COMMUNITY): Payer: Self-pay | Admitting: Internal Medicine

## 2017-02-10 DIAGNOSIS — R011 Cardiac murmur, unspecified: Secondary | ICD-10-CM

## 2017-02-14 ENCOUNTER — Ambulatory Visit (HOSPITAL_COMMUNITY)
Admission: RE | Admit: 2017-02-14 | Discharge: 2017-02-14 | Disposition: A | Discharge status: Home / Self Care | Payer: Medicare Other | Source: Ambulatory Visit | Attending: Internal Medicine | Admitting: Internal Medicine

## 2017-02-14 DIAGNOSIS — I059 Rheumatic mitral valve disease, unspecified: Secondary | ICD-10-CM | POA: Diagnosis not present

## 2017-02-14 DIAGNOSIS — R011 Cardiac murmur, unspecified: Secondary | ICD-10-CM | POA: Diagnosis not present

## 2017-02-14 DIAGNOSIS — I119 Hypertensive heart disease without heart failure: Secondary | ICD-10-CM | POA: Insufficient documentation

## 2017-02-14 DIAGNOSIS — E119 Type 2 diabetes mellitus without complications: Secondary | ICD-10-CM | POA: Insufficient documentation

## 2017-02-14 NOTE — Progress Notes (Signed)
*  PRELIMINARY RESULTS* Echocardiogram 2D Echocardiogram has been performed.  Leavy Cella 02/14/2017, 1:40 PM

## 2017-05-24 DIAGNOSIS — Z79899 Other long term (current) drug therapy: Secondary | ICD-10-CM | POA: Diagnosis not present

## 2017-05-24 DIAGNOSIS — E785 Hyperlipidemia, unspecified: Secondary | ICD-10-CM | POA: Diagnosis not present

## 2017-05-24 DIAGNOSIS — E1139 Type 2 diabetes mellitus with other diabetic ophthalmic complication: Secondary | ICD-10-CM | POA: Diagnosis not present

## 2017-05-24 DIAGNOSIS — I10 Essential (primary) hypertension: Secondary | ICD-10-CM | POA: Diagnosis not present

## 2017-05-30 DIAGNOSIS — N183 Chronic kidney disease, stage 3 (moderate): Secondary | ICD-10-CM | POA: Diagnosis not present

## 2017-05-30 DIAGNOSIS — E1139 Type 2 diabetes mellitus with other diabetic ophthalmic complication: Secondary | ICD-10-CM | POA: Diagnosis not present

## 2017-05-30 DIAGNOSIS — I10 Essential (primary) hypertension: Secondary | ICD-10-CM | POA: Diagnosis not present

## 2017-06-15 DIAGNOSIS — H353210 Exudative age-related macular degeneration, right eye, stage unspecified: Secondary | ICD-10-CM | POA: Diagnosis not present

## 2017-06-15 DIAGNOSIS — H524 Presbyopia: Secondary | ICD-10-CM | POA: Diagnosis not present

## 2017-06-20 NOTE — Progress Notes (Signed)
[facility] Note  06/21/2017     CHIEF COMPLAINT Patient presents for Retina Evaluation and Diabetic Eye Exam   HISTORY OF PRESENT ILLNESS: [name] is a 84 y.o. female who presents to the clinic today for:   HPI    Retina Evaluation    In both eyes.  This started 6 months ago.  Associated Symptoms Pain, Trauma, Flashes, Floaters and Redness.  Negative for Scalp Tenderness, Distortion, Photophobia, Jaw Claudication, Shoulder/Hip pain, Blind Spot, Glare, Fever, Weight Loss and Fatigue.  Context:  distance vision, mid-range vision, near vision and reading.  Treatments tried include surgery.  Response to treatment was mild improvement.  I, the attending physician,  performed the HPI with the patient and updated documentation appropriately.          Diabetic Eye Exam    Vision is stable.  Associated Symptoms Negative for Flashes, Pain, Trauma, Fever, Floaters, Redness, Scalp Tenderness, Weight Loss, Fatigue, Jaw Claudication, Photophobia, Distortion, Blind Spot, Glare and Shoulder/Hip pain.  Diabetes characteristics include Type 2, on insulin and taking oral medications.  This started 40 years ago.  Blood sugar level is controlled.  Last Blood Glucose 135.  Last A1C 7.  I, the attending physician,  performed the HPI with the patient and updated documentation appropriately.          Comments    Referral of DR. Turner for retina eval. Patient states she has occasional flashes, light sensitivity "especially the sun light"and watery eyes.  Pt reports she has feels as if a film is covering left eye. She occasionally has right eye pain.Pt states when she reads her eye cross. Pt reports in 1964 she got hit with a rock in the right eye by lawn mower and was hospitalized.Pt had cataract sx two yrs ago, eyelid sx OD .  Pt is DM2 and does not check her Bs regularly but reports  Bs are stable. Bs 135 (06/19/17) A1C 7(05/30/17). Pt is on insulin and Metformin,Denies  Vits/gtt's         Last edited by [name], MD on 06/21/2017 11:16 AM. (History)    Pt states she has been seeing Dr. Radford Pax for a "cloud over vision" OS x 6 months; pt states she has been having trouble reading; Pt reports she saw Dr. Gershon Crane for eye lid sx, states he did lower lid sx x 2; Pt states she is seeing Dr. Kristeen Miss with [facility] for lid eval;   Referring physician: Particia Nearing, [place] [place], [place] [address]  HISTORICAL INFORMATION:   Selected notes from the MEDICAL RECORD NUMBER Referred by Dr. [name] for concern of exudative AMD LEE- 02.27.19 (W. Turner) [BCVA OD: 20/40 OS: 20/70-] Ocular Hx- pseudophakia OU, lid sx ([place] x 24 yrs ago) PMH- DM, HTN    CURRENT MEDICATIONS: No current outpatient medications on file. (Ophthalmic Drugs)   No current facility-administered medications for this visit.  (Ophthalmic Drugs)   Current Outpatient Medications (Other)  Medication Sig  . acetaminophen (TYLENOL) 500 MG tablet Take 500 mg by mouth every 6 (six) hours as needed. For arthritis pain  . insulin glargine (LANTUS) 100 UNIT/ML injection Inject 35 Units into the skin daily.   . insulin glargine (LANTUS) 100 UNIT/ML injection   . losartan-hydrochlorothiazide (HYZAAR) 100-25 MG per tablet Take 1 tablet by mouth daily.  . metFORMIN (GLUCOPHAGE) 500 MG tablet Take 500 mg by mouth daily.  . metFORMIN (GLUCOPHAGE-XR) 500 MG  24 hr tablet   . sulfamethoxazole-trimethoprim (BACTRIM DS,SEPTRA DS) 800-160 MG tablet Take 1 tablet by mouth 2 (two) times daily.   No current facility-administered medications for this visit.  (Other)      REVIEW OF SYSTEMS: ROS    Positive for: Genitourinary, Musculoskeletal, Endocrine, Eyes   Negative for: Constitutional, Gastrointestinal, Neurological, Skin, HENT, Cardiovascular, Respiratory, Psychiatric, Allergic/Imm, Heme/Lymph   Last edited by Zenovia Jordan, LPN on 06/25/1015  5:10 AM. (History)        ALLERGIES No Known Allergies  PAST MEDICAL HISTORY Past Medical History:  Diagnosis Date  . Arthritis   . Back pain 01/30/2015  . Diabetes mellitus without complication ([place])   . Hematuria 12/25/2014  . Hypertension   . PONV (postoperative nausea and vomiting)   . Urinary urgency 12/25/2014  . UTI (lower urinary tract infection) 11/02/2013   Past Surgical History:  Procedure Laterality Date  . ABDOMINAL HYSTERECTOMY  1960s   with bladder tack up  . BREAST SURGERY     benign, right  . C-EYE SURGERY PROCEDURE    . CATARACT EXTRACTION W/PHACO  02/08/2012   Procedure: CATARACT EXTRACTION PHACO AND INTRAOCULAR LENS PLACEMENT (IOC);  Surgeon: Elta Guadeloupe T. Gershon Crane, MD;  Location: [facility];  Service: Ophthalmology;  Laterality: Left;  CDE:11.62  . EYE SURGERY    . RECTOCELE REPAIR  1985   [facility], [place]    FAMILY HISTORY Family History  Problem Relation Age of Onset  . Diabetes Mother   . Diabetes Brother   . Diabetes Brother   . Heart attack Brother   . Cancer Brother        lung  . Cancer Brother        lung  . Birth defects Brother        passed away at age 55; hole in heart  . Emphysema Brother     SOCIAL HISTORY Social History   Tobacco Use  . Smoking status: Never Smoker  . Smokeless tobacco: Never Used  Substance Use Topics  . Alcohol use: No  . Drug use: No         OPHTHALMIC EXAM:  Base Eye Exam    Visual Acuity (Snellen - Linear)      Right Left   Dist cc 20/40 -2 20/70 -2   Dist ph cc NI NI   Correction:  Glasses       Tonometry (Tonopen, 10:09 AM)      Right Left   Pressure 14 12       Pupils      Dark Light Shape React APD   Right 3 2 Round Brisk None   Left 3 2 Round Slow +1       Visual Fields (Counting fingers)      Left Right    Full Full       Extraocular Movement      Right Left    Full, Ortho Full, Ortho       Neuro/Psych    Oriented x3:  Yes   Mood/Affect:  Normal       Dilation    Both eyes:  1.0% Mydriacyl, 2.5%  Phenylephrine @ 10:12 AM        Slit Lamp and Fundus Exam    Slit Lamp Exam      Right Left   Lids/Lashes Entropion LL with lashes on globe surface, Dermatochalasis - upper lid, Telangiectasia, Meibomian gland dysfunction Dermatochalasis - upper lid, Telangiectasia   Conjunctiva/Sclera White and quiet White and  quiet   Cornea Arcus, Inferior 1+ Punctate epithelial erosions Arcus, Inferior 1-2+ Punctate epithelial erosions   Anterior Chamber Deep and quiet Deep and quiet   Iris round, moderatly dilated Round and dilated   Lens PC IOL in good position, 1+ Posterior capsular opacification PC IOL in good position, 1+ non-central Posterior capsular opacification   Vitreous Posterior vitreous detachment Posterior vitreous detachment       Fundus Exam      Right Left   Disc Peripapillary atrophy and pigmenation 360 Peripapillary atrophy   C/D Ratio 0.5 0.4   Macula Drusen, RPE mottling and clumping, Blunted foveal reflex, No heme or edema, RPE atrophy nasal to fovea peripapillary hemogghage, +CNVM, Blunted foveal reflex, Drusen, RPE mottling and clumping   Vessels Vascular attenuation Vascular attenuation   Periphery Attached, mild peripheral drusen, Reticular degeneration Attached, blot hemorrhage nasal to disc        Refraction    Wearing Rx      Sphere Cylinder Axis Add   Right -1.00 +0.75 012 +2.75   Left -0.25 +1.00 013 +2.50       Manifest Refraction (Over)      Sphere Cylinder Axis Dist VA   Right +0.50 +0.75 020 20/60-2   Left +0.75 +1.00 015 20/70-2          IMAGING AND PROCEDURES  Imaging and Procedures for 06/21/17  OCT, Retina - OU - Both Eyes     Right Eye Quality was good. Central Foveal Thickness: 184. Progression has no prior data. Findings include normal foveal contour, no IRF, no SRF, outer retinal atrophy, epiretinal membrane, retinal drusen .   Left Eye Central Foveal Thickness: 601. Progression has no prior data. Findings include abnormal foveal  contour, subretinal fluid, intraretinal fluid, retinal drusen , subretinal hyper-reflective material.   Notes *Images captured and stored on drive  Diagnosis / Impression:  Non-Exudative ARMD OD Exudative ARMD OS  Clinical management:  See below  Abbreviations: NFP - Normal foveal profile. CME - cystoid macular edema. PED - pigment epithelial detachment. IRF - intraretinal fluid. SRF - subretinal fluid. EZ - ellipsoid zone. ERM - epiretinal membrane. ORA - outer retinal atrophy. ORT - outer retinal tubulation. SRHM - subretinal hyper-reflective material         Intravitreal Injection, Pharmacologic Agent - OS - Left Eye     Time Out 06/21/2017. 11:45 AM. Confirmed correct patient, procedure, site, and patient consented.   Anesthesia Topical anesthesia was used. Anesthetic medications included Tetracaine 0.5%, Lidocaine 2%.   Procedure Preparation included 5% betadine to ocular surface, eyelid speculum. A 30 gauge needle was used.   Injection: 1.25 mg Bevacizumab 1.25mg /0.46ml   NDC: 84166-063-01    Lot: [id number]     Expiration Date: 08/01/2017   Route: Intravitreal   Site: Left Eye   Waste: 0 mg  Post-op Post injection exam found visual acuity of at least counting fingers. The patient tolerated the procedure well. There were no complications. The patient received written and verbal post procedure care education.                 ASSESSMENT/PLAN:    ICD-10-CM   1. Exudative age-related macular degeneration of left eye with active choroidal neovascularization ([place]) H35.3221   2. Intermediate stage nonexudative age-related macular degeneration of right eye H35.3112   3. Mild nonproliferative diabetic retinopathy of both eyes without macular edema associated with type 2 diabetes mellitus ([place]) [address]   4. Retinal edema H35.81 OCT, Retina - OU -  Both Eyes    Intravitreal Injection, Pharmacologic Agent - OS - Left Eye  5. Posterior vitreous detachment of both eyes  H43.813   6. Pseudophakia of both eyes Z96.1   7. Entropion of right eyelid H02.003     1. Exudative age related macular degeneration, OS    - The incidence pathology and anatomy of wet AMD discussed   - The ANCHOR, MARINA, CATT and VIEW trials discussed with patient.    - discussed treatment options including observation vs intravitreal anti-VEGF agents such as Avastin, Lucentis, Eylea.    - Risks of endophthalmitis and vascular occlusive events and atrophic changes discussed with patient  - OCT with +IRF/SRF and +CNVM  - recommend IVA OS #1 today (03.05.19)  - pt wishes to be treated with IVA   - RBA of procedure discussed, questions answered  - informed consent obtained and signed  - see procedure note  - f/u in 4 wks  2. Age related macular degeneration, non-exudative, OD  - The incidence, anatomy, and pathology of dry AMD, risk of progression, and the AREDS and AREDS 2 study including smoking risks discussed with patient.  - Recommend amsler grid monitoring  3. Mild nonproliferative diabetic retinopathy w/o DME, both eyes - The incidence, risk factors for progression, natural history and treatment options for diabetic retinopathy were discussed with patient.   - The need for close monitoring of blood glucose, blood pressure, and serum lipids, avoiding cigarette or any type of tobacco, and the need for long term follow up was also discussed with patient. - exam with rare IRH  - recommend FA for full diabetic eval  4. Retinal edema as described above  5. PVD / vitreous syneresis OU  Discussed findings and prognosis  No RT or RD on 360 peripheral exam  Reviewed s/s of RT/RD  Strict return precautions for any such RT/RD signs/symptoms  6. Pseudophakia OU  - s/p CE/IOL  - beautiful surgery, doing well  - monitor  5. Entropion RLL-  - s/p lower lid sx x 2 by Dr. Gershon Crane - appointment scheduled with Dr[facility] Kristeen [facility] at [facility] for lid eval     Ophthalmic Meds Ordered this  visit:  No orders of the defined types were placed in this encounter.      Return in about 4 weeks (around 07/19/2017) for F/U EXU AMD OS.  There are no Patient Instructions on file for this visit.   Explained the diagnoses, plan, and follow up with the patient and they expressed understanding.  Patient expressed understanding of the importance of proper follow up care.   This document serves as a record of services personally performed by Gardiner Sleeper, MD, PhD. It was created on their behalf by Catha Brow, [place], a certified ophthalmic assistant. The creation of this record is the provider's dictation and/or activities during the visit.  Electronically signed by: Catha Brow, [place]  06/21/17 9:58 PM   Gardiner Sleeper, M.D., Ph.D. Diseases & Surgery of the Retina and [place] 06/21/17   I have reviewed the above documentation for accuracy and completeness, and I agree with the above. Gardiner Sleeper, M.D., Ph.D. 06/21/17 9:58 PM   Abbreviations: M myopia (nearsighted); A astigmatism; H hyperopia (farsighted); P presbyopia; Mrx spectacle prescription;  CTL contact lenses; OD right eye; OS left eye; OU both eyes  XT exotropia; ET esotropia; PEK punctate epithelial keratitis; PEE punctate epithelial erosions; DES dry eye syndrome; MGD meibomian gland dysfunction;  ATs artificial tears; PFAT's preservative free artificial tears; [place] nuclear sclerotic cataract; PSC posterior subcapsular cataract; ERM epi-retinal membrane; PVD posterior vitreous detachment; RD retinal detachment; DM diabetes mellitus; DR diabetic retinopathy; NPDR non-proliferative diabetic retinopathy; PDR proliferative diabetic retinopathy; CSME clinically significant macular edema; DME diabetic macular edema; dbh dot blot hemorrhages; CWS cotton wool spot; POAG primary open angle glaucoma; C/D cup-to-disc ratio; HVF humphrey visual field; GVF goldmann visual field; OCT optical  coherence tomography; IOP intraocular pressure; BRVO Branch retinal vein occlusion; CRVO central retinal vein occlusion; CRAO central retinal artery occlusion; BRAO branch retinal artery occlusion; RT retinal tear; SB scleral buckle; PPV pars plana vitrectomy; VH Vitreous hemorrhage; PRP panretinal laser photocoagulation; IVK intravitreal kenalog; VMT vitreomacular traction; MH Macular hole;  NVD neovascularization of the disc; NVE neovascularization elsewhere; AREDS age related eye disease study; ARMD age related macular degeneration; POAG primary open angle glaucoma; EBMD epithelial/anterior basement membrane dystrophy; ACIOL anterior chamber intraocular lens; IOL intraocular lens; PCIOL posterior chamber intraocular lens; Phaco/IOL phacoemulsification with intraocular lens placement; [place] photorefractive keratectomy; LASIK laser assisted in situ keratomileusis; HTN hypertension; DM diabetes mellitus; COPD chronic obstructive pulmonary disease

## 2017-06-21 ENCOUNTER — Ambulatory Visit (INDEPENDENT_AMBULATORY_CARE_PROVIDER_SITE_OTHER): Payer: Medicare Other | Admitting: Ophthalmology

## 2017-06-21 ENCOUNTER — Encounter (INDEPENDENT_AMBULATORY_CARE_PROVIDER_SITE_OTHER): Payer: Self-pay | Admitting: Ophthalmology

## 2017-06-21 DIAGNOSIS — E113293 Type 2 diabetes mellitus with mild nonproliferative diabetic retinopathy without macular edema, bilateral: Secondary | ICD-10-CM

## 2017-06-21 DIAGNOSIS — Z961 Presence of intraocular lens: Secondary | ICD-10-CM | POA: Diagnosis not present

## 2017-06-21 DIAGNOSIS — H43813 Vitreous degeneration, bilateral: Secondary | ICD-10-CM | POA: Diagnosis not present

## 2017-06-21 DIAGNOSIS — H02003 Unspecified entropion of right eye, unspecified eyelid: Secondary | ICD-10-CM | POA: Diagnosis not present

## 2017-06-21 DIAGNOSIS — H353112 Nonexudative age-related macular degeneration, right eye, intermediate dry stage: Secondary | ICD-10-CM

## 2017-06-21 DIAGNOSIS — H353221 Exudative age-related macular degeneration, left eye, with active choroidal neovascularization: Secondary | ICD-10-CM

## 2017-06-21 DIAGNOSIS — H3581 Retinal edema: Secondary | ICD-10-CM

## 2017-06-21 MED ORDER — BEVACIZUMAB CHEMO INJECTION 1.25MG/0.05ML SYRINGE FOR KALEIDOSCOPE
1.2500 mg | INTRAVITREAL | Status: DC
  Administered 2017-06-21: 1.25 mg via INTRAVITREAL

## 2017-06-28 DIAGNOSIS — H02032 Senile entropion of right lower eyelid: Secondary | ICD-10-CM | POA: Diagnosis not present

## 2017-06-28 DIAGNOSIS — H02052 Trichiasis without entropian right lower eyelid: Secondary | ICD-10-CM | POA: Diagnosis not present

## 2017-07-07 ENCOUNTER — Encounter: Payer: Self-pay | Admitting: Adult Health

## 2017-07-07 ENCOUNTER — Ambulatory Visit: Payer: Medicare Other | Admitting: Adult Health

## 2017-07-07 VITALS — BP 150/80 | HR 98 | Temp 98.3°F | Ht 62.4 in | Wt 168.4 lb

## 2017-07-07 DIAGNOSIS — N390 Urinary tract infection, site not specified: Secondary | ICD-10-CM | POA: Diagnosis not present

## 2017-07-07 DIAGNOSIS — R8290 Unspecified abnormal findings in urine: Secondary | ICD-10-CM

## 2017-07-07 DIAGNOSIS — R319 Hematuria, unspecified: Secondary | ICD-10-CM | POA: Diagnosis not present

## 2017-07-07 DIAGNOSIS — R300 Dysuria: Secondary | ICD-10-CM

## 2017-07-07 DIAGNOSIS — R350 Frequency of micturition: Secondary | ICD-10-CM

## 2017-07-07 LAB — POCT URINALYSIS DIPSTICK
Glucose, UA: NEGATIVE
KETONES UA: NEGATIVE

## 2017-07-07 MED ORDER — SULFAMETHOXAZOLE-TRIMETHOPRIM 800-160 MG PO TABS: 1.0000 | ORAL_TABLET | Freq: Two times a day (BID) | ORAL | 0 refills | Status: DC

## 2017-07-07 NOTE — Progress Notes (Signed)
Subjective:     Patient ID: [name], female   DOB: Dec 21, 1932, 84 y.o.   MRN: [id number]  HPI [name] is a 84 year old white female, sp hysterectomy worked in for complaints of burning with urination and frequency.And urine has bad odor, she has had for a few days and that it would get better, but it did not.   Review of Systems +urinary frequency +burning with urination +bad odor in urine Reviewed past medical,surgical, social and family history. Reviewed medications and allergies.     Objective:   Physical Exam BP (!) 150/80 (BP Location: Left Arm, Patient Position: Sitting, Cuff Size: Small)   Pulse 98   Temp 98.3 F (36.8 C)   Ht 5' 2.4" (1.585 m)   Wt 168 lb 6.4 oz (76.4 kg)   BMI 30.41 kg/m urine +nitrates, trace protein, small amt blood and 2+ WBC   Skin warm and dry tender over bladder and back is tender but no CVAT. Will get UA C&S and Rx Septra ds. Assessment:     1. Urinary tract infection with hematuria, site unspecified   2. Burning with urination   3. Urinary frequency   4. Bad odor of urine       Plan:     UA C&S sent Meds ordered this encounter  Medications  . sulfamethoxazole-trimethoprim (BACTRIM DS,SEPTRA DS) 800-160 MG tablet    Sig: Take 1 tablet by mouth 2 (two) times daily. Take 1 bid    Dispense:  14 tablet    Refill:  0    Order Specific Question:   Supervising Provider    Answer:   Tania Ade H [[id number]]  F/U prn  Push fluids  Review handout on UTI

## 2017-07-07 NOTE — Patient Instructions (Signed)

## 2017-07-08 LAB — MICROSCOPIC EXAMINATION: CASTS: NONE SEEN /LPF

## 2017-07-08 LAB — URINALYSIS, ROUTINE W REFLEX MICROSCOPIC
BILIRUBIN UA: NEGATIVE
Ketones, UA: NEGATIVE
Nitrite, UA: POSITIVE — AB
PH UA: 5.5 (ref 5.0–7.5)
Specific Gravity, UA: 1.02 (ref 1.005–1.030)
UUROB: 0.2 mg/dL (ref 0.2–1.0)

## 2017-07-09 LAB — URINE CULTURE

## 2017-07-13 DIAGNOSIS — H02001 Unspecified entropion of right upper eyelid: Secondary | ICD-10-CM | POA: Diagnosis not present

## 2017-07-13 DIAGNOSIS — H02032 Senile entropion of right lower eyelid: Secondary | ICD-10-CM | POA: Diagnosis not present

## 2017-07-13 DIAGNOSIS — H02002 Unspecified entropion of right lower eyelid: Secondary | ICD-10-CM | POA: Diagnosis not present

## 2017-07-18 NOTE — Progress Notes (Signed)
[facility] Note  07/20/2017     CHIEF COMPLAINT Patient presents for Retina Follow Up   HISTORY OF PRESENT ILLNESS: [name] is a 84 y.o. female who presents to the clinic today for:   HPI    Retina Follow Up    Patient presents with  Wet AMD.  In left eye.  Severity is moderate.  Duration of 4.  Since onset it is stable.  I, the attending physician,  performed the HPI with the patient and updated documentation appropriately.          Comments    Pt presents today for exu eval, pt states VA is stable since last visit, pt had sx on lower lid OD on 07/13/17, pt goes back on 08/01/17 to get stitches out, pt states she sees occasional floaters, but denies pain, flashes or wavy vision, pt denies the use of gtts, pt has not checked BS today       Last edited by [name], MD on 07/20/2017  9:37 AM. (History)    Pt states she had lid sx with Dr. Kristeen Miss; Pt states she goes back on April 15th for post op;   Subjective  Patient ID: [name] is a 84 y.o. female.  Chief Complaint    Retina Follow Up     HPI    Retina Follow Up    Patient presents with  Wet AMD.  In left eye.  Severity is moderate.  Duration of 4.  Since onset it is stable.  I, the attending physician,  performed the HPI with the patient and updated documentation appropriately.          Comments    Pt presents today for exu eval, pt states VA is stable since last visit, pt had sx on lower lid OD on 07/13/17, pt goes back on 08/01/17 to get stitches out, pt states she sees occasional floaters, but denies pain, flashes or wavy vision, pt denies the use of gtts, pt has not checked BS today       Last edited by [name], MD on 07/20/2017  9:37 AM. (History)     No current outpatient medications on file. (Ophthalmic Drugs)   No current facility-administered medications for this visit.  (Ophthalmic Drugs)   Current Outpatient Medications (Other)  Medication Sig  .  acetaminophen (TYLENOL) 500 MG tablet Take 500 mg by mouth every 6 (six) hours as needed. For arthritis pain  . insulin glargine (LANTUS) 100 UNIT/ML injection Inject 35 Units into the skin daily.   . insulin glargine (LANTUS) 100 UNIT/ML injection   . losartan-hydrochlorothiazide (HYZAAR) 100-25 MG per tablet Take 1 tablet by mouth daily.  . metFORMIN (GLUCOPHAGE) 500 MG tablet Take 500 mg by mouth daily.  . metFORMIN (GLUCOPHAGE-XR) 500 MG 24 hr tablet   . olmesartan (BENICAR) 20 MG tablet Take 20 mg by mouth daily.  Marland Kitchen sulfamethoxazole-trimethoprim (BACTRIM DS,SEPTRA DS) 800-160 MG tablet Take 1 tablet by mouth 2 (two) times daily. Take 1 bid   Current Facility-Administered Medications (Other)  Medication Route  . Bevacizumab (AVASTIN) SOLN 1.25 mg Intravitreal  . Bevacizumab (AVASTIN) SOLN 1.25 mg Intravitreal    Objective  Base Eye Exam    Visual Acuity (Snellen - Linear)      Right Left   Dist [place] 20/100 +2 20/60 +2   Dist ph [place] NI 20/50 -2       Tonometry (Tonopen, 9:40 AM)  Right Left   Pressure  15  iop was not checked OD bc of post op        Pupils      Dark Light Shape React APD   Right        Left 2 1.5 Round Minimal None  Pt eye is bandaged post op, unable to see pupil       Visual Fields (Counting fingers)      Left Right    Full Full       Extraocular Movement      Right Left    Full, Ortho Full, Ortho       Neuro/Psych    Oriented x3:  Yes   Mood/Affect:  Normal       Dilation    Left eye:  1.0% Mydriacyl, 2.5% Phenylephrine @ 9:40 AM  OD was not dilated bc of post op        Slit Lamp and Fundus Exam    External Exam      Right Left   External Peri orbital swelling        Slit Lamp Exam      Right Left   Lids/Lashes Post-surgical periorbital edema; steri-strips lateral lower lid Dermatochalasis - upper lid, Telangiectasia   Conjunctiva/Sclera Mild Injection White and quiet   Cornea Arcus, Inferior 1+ Punctate epithelial erosions  Arcus, Inferior 1-2+ Punctate epithelial erosions   Anterior Chamber Deep and quiet Deep and quiet   Iris Round and reactive Round and dilated   Lens PC IOL in good position, 1+ Posterior capsular opacification PC IOL in good position, 1+ non-central Posterior capsular opacification   Vitreous Posterior vitreous detachment Posterior vitreous detachment       Fundus Exam      Right Left   Disc  360 Peripapillary atrophy   C/D Ratio 0.5 0.4   Macula  peripapillary hemorrhage, +CNVM with interval improvement in edema, Blunted foveal reflex, Drusen, RPE atrophy, mottling and clumping   Vessels  Vascular attenuation   Periphery  Attached, blot hemorrhage nasal to disc - shrinking          Assessment   Referring physician: [name], MD [id number] [place], [place] [address]  HISTORICAL INFORMATION:   Selected notes from the MEDICAL RECORD NUMBER Referred by Dr. [name] for concern of exudative AMD LEE- 02.27.19 (W. Turner) [BCVA OD: 20/40 OS: 20/70-] Ocular Hx- pseudophakia OU, lid sx (Shapiro x 24 yrs ago) PMH- DM, HTN    CURRENT MEDICATIONS: No current outpatient medications on file. (Ophthalmic Drugs)   No current facility-administered medications for this visit.  (Ophthalmic Drugs)   Current Outpatient Medications (Other)  Medication Sig  . acetaminophen (TYLENOL) 500 MG tablet Take 500 mg by mouth every 6 (six) hours as needed. For arthritis pain  . insulin glargine (LANTUS) 100 UNIT/ML injection Inject 35 Units into the skin daily.   . insulin glargine (LANTUS) 100 UNIT/ML injection   . losartan-hydrochlorothiazide (HYZAAR) 100-25 MG per tablet Take 1 tablet by mouth daily.  . metFORMIN (GLUCOPHAGE) 500 MG tablet Take 500 mg by mouth daily.  . metFORMIN (GLUCOPHAGE-XR) 500 MG 24 hr tablet   . olmesartan (BENICAR) 20 MG tablet Take 20 mg by mouth daily.  Marland Kitchen sulfamethoxazole-trimethoprim (BACTRIM DS,SEPTRA DS) 800-160 MG tablet Take 1 tablet by mouth 2 (two) times  daily. Take 1 bid   Current Facility-Administered Medications (Other)  Medication Route  . Bevacizumab (AVASTIN) SOLN 1.25 mg Intravitreal  . Bevacizumab (AVASTIN) SOLN  1.25 mg Intravitreal      REVIEW OF SYSTEMS: ROS    Positive for: Musculoskeletal, Endocrine, Cardiovascular, Eyes   Negative for: Constitutional, Gastrointestinal, Neurological, Skin, Genitourinary, HENT, Respiratory, Psychiatric, Allergic/Imm, Heme/Lymph   Last edited by Debbrah Alar, COT on 07/20/2017  9:32 AM. (History)       ALLERGIES No Known Allergies  PAST MEDICAL HISTORY Past Medical History:  Diagnosis Date  . Arthritis   . Back pain 01/30/2015  . Diabetes mellitus without complication ([place])   . Hematuria 12/25/2014  . Hypertension   . PONV (postoperative nausea and vomiting)   . Urinary urgency 12/25/2014  . UTI (lower urinary tract infection) 11/02/2013   Past Surgical History:  Procedure Laterality Date  . ABDOMINAL HYSTERECTOMY  1960s   with bladder tack up  . BREAST SURGERY     benign, right  . C-EYE SURGERY PROCEDURE    . CATARACT EXTRACTION W/PHACO  02/08/2012   Procedure: CATARACT EXTRACTION PHACO AND INTRAOCULAR LENS PLACEMENT (IOC);  Surgeon: Elta Guadeloupe T. Gershon Crane, MD;  Location: [facility];  Service: Ophthalmology;  Laterality: Left;  CDE:11.62  . EYE SURGERY    . RECTOCELE REPAIR  1985   [facility], [place]    FAMILY HISTORY Family History  Problem Relation Age of Onset  . Diabetes Mother   . Diabetes Brother   . Diabetes Brother   . Heart attack Brother   . Cancer Brother        lung  . Cancer Brother        lung  . Birth defects Brother        passed away at age 16; hole in heart  . Emphysema Brother     SOCIAL HISTORY Social History   Tobacco Use  . Smoking status: Never Smoker  . Smokeless tobacco: Never Used  Substance Use Topics  . Alcohol use: No  . Drug use: No         OPHTHALMIC EXAM:  Base Eye Exam    Visual Acuity (Snellen - Linear)      Right Left   Dist  [place] 20/100 +2 20/60 +2   Dist ph [place] NI 20/50 -2       Tonometry (Tonopen, 9:40 AM)      Right Left   Pressure  15  iop was not checked OD bc of post op        Pupils      Dark Light Shape React APD   Right        Left 2 1.5 Round Minimal None  Pt eye is bandaged post op, unable to see pupil       Visual Fields (Counting fingers)      Left Right    Full Full       Extraocular Movement      Right Left    Full, Ortho Full, Ortho       Neuro/Psych    Oriented x3:  Yes   Mood/Affect:  Normal       Dilation    Left eye:  1.0% Mydriacyl, 2.5% Phenylephrine @ 9:40 AM  OD was not dilated bc of post op        Slit Lamp and Fundus Exam    External Exam      Right Left   External Peri orbital swelling        Slit Lamp Exam      Right Left   Lids/Lashes Post-surgical periorbital edema; steri-strips lateral lower lid Dermatochalasis -  upper lid, Telangiectasia   Conjunctiva/Sclera Mild Injection White and quiet   Cornea Arcus, Inferior 1+ Punctate epithelial erosions Arcus, Inferior 1-2+ Punctate epithelial erosions   Anterior Chamber Deep and quiet Deep and quiet   Iris Round and reactive Round and dilated   Lens PC IOL in good position, 1+ Posterior capsular opacification PC IOL in good position, 1+ non-central Posterior capsular opacification   Vitreous Posterior vitreous detachment Posterior vitreous detachment       Fundus Exam      Right Left   Disc  360 Peripapillary atrophy   C/D Ratio 0.5 0.4   Macula  peripapillary hemorrhage, +CNVM with interval improvement in edema, Blunted foveal reflex, Drusen, RPE atrophy, mottling and clumping   Vessels  Vascular attenuation   Periphery  Attached, blot hemorrhage nasal to disc - shrinking          IMAGING AND PROCEDURES  Imaging and Procedures for 07/20/17  OCT, Retina - OU - Both Eyes       Right Eye Quality was (No scan today).   Left Eye Quality was good. Central Foveal Thickness: 400. Progression  has improved. Findings include abnormal foveal contour, subretinal fluid, intraretinal fluid, retinal drusen , subretinal hyper-reflective material (Interval improvement in IRF/SRF).   Notes *Images captured and stored on drive  Diagnosis / Impression:  Non-Exudative ARMD OD - no scan today Exudative ARMD OS with interval improvement in IRF/SRF  Clinical management:  See below  Abbreviations: NFP - Normal foveal profile. CME - cystoid macular edema. PED - pigment epithelial detachment. IRF - intraretinal fluid. SRF - subretinal fluid. EZ - ellipsoid zone. ERM - epiretinal membrane. ORA - outer retinal atrophy. ORT - outer retinal tubulation. SRHM - subretinal hyper-reflective material         Intravitreal Injection, Pharmacologic Agent - OS - Left Eye       Time Out 07/20/2017. 10:25 AM. Confirmed correct patient, procedure, site, and patient consented.   Anesthesia Topical anesthesia was used. Anesthetic medications included Tetracaine 0.5%, Lidocaine 2%.   Procedure Preparation included 5% betadine to ocular surface, eyelid speculum. A supplied needle was used.   Injection: 1.25 mg Bevacizumab 1.25mg /0.1ml   NDC: 62952-841-32    Lot: [id number]     Expiration Date: 08/22/2017   Route: Intravitreal   Site: Left Eye   Waste: 0 mg  Post-op Post injection exam found visual acuity of at least counting fingers. The patient tolerated the procedure well. There were no complications. The patient received written and verbal post procedure care education.                 ASSESSMENT/PLAN:    ICD-10-CM   1. Exudative age-related macular degeneration of left eye with active choroidal neovascularization (HCC) H35.3221 Intravitreal Injection, Pharmacologic Agent - OS - Left Eye    Bevacizumab (AVASTIN) SOLN 1.25 mg  2. Intermediate stage nonexudative age-related macular degeneration of right eye H35.3112   3. Mild nonproliferative diabetic retinopathy of both eyes without  macular edema associated with type 2 diabetes mellitus ([place]) [address]   4. Retinal edema H35.81 OCT, Retina - OU - Both Eyes  5. Posterior vitreous detachment of both eyes H43.813   6. Pseudophakia of both eyes Z96.1   7. Entropion of right eyelid H02.003     1. Exudative age related macular degeneration, OS    - The incidence pathology and anatomy of wet AMD discussed   - The ANCHOR, MARINA, CATT and VIEW trials discussed with patient.    -  discussed treatment options including observation vs intravitreal anti-VEGF agents such as Avastin, Lucentis, Eylea.    - Risks of endophthalmitis and vascular occlusive events and atrophic changes discussed with patient  - S/P IVA OS #1 (03.05.19)  - BCVA improved to 20/50 from 20/70  - OCT with interval improvement in IRF/SRF and edema  - recommend IVA OS #2 today (04.03.19)  - pt wishes to be treated with IVA   - RBA of procedure discussed, questions answered  - informed consent obtained and signed  - see procedure note  - f/u in 4 wks  2. Age related macular degeneration, non-exudative, OD  - The incidence, anatomy, and pathology of dry AMD, risk of progression, and the AREDS and AREDS 2 study including smoking risks discussed with patient.  - Recommend amsler grid monitoring  3. Mild nonproliferative diabetic retinopathy w/o DME, both eyes - The incidence, risk factors for progression, natural history and treatment options for diabetic retinopathy were discussed with patient.   - The need for close monitoring of blood glucose, blood pressure, and serum lipids, avoiding cigarette or any type of tobacco, and the need for long term follow up was also discussed with patient. - exam with rare IRH  - recommend FA for full diabetic eval  4. Retinal edema as described above  5. PVD / vitreous syneresis OU  Discussed findings and prognosis  No RT or RD on 360 peripheral exam  Reviewed s/s of RT/RD  Strict return precautions for any such RT/RD  signs/symptoms  6. Pseudophakia OU  - s/p CE/IOL  - beautiful surgey, doing well  - monitor  5. Entropion RLL-  - s/p lower lid sx x 2 by Dr. Gershon Crane - now s/p repair with Dr. Kristeen Miss - doing well, steristrips in place - management per Dr. Kristeen Miss     Ophthalmic Meds Ordered this visit:  Meds ordered this encounter  Medications  . Bevacizumab (AVASTIN) SOLN 1.25 mg       No follow-ups on file.  There are no Patient Instructions on file for this visit.   Explained the diagnoses, plan, and follow up with the patient and they expressed understanding.  Patient expressed understanding of the importance of proper follow up care.   This document serves as a record of services personally performed by Gardiner Sleeper, MD, PhD. It was created on their behalf by Catha Brow, [place], a certified ophthalmic assistant. The creation of this record is the provider's dictation and/or activities during the visit.  Electronically signed by: Catha Brow, [place]  07/20/17 4:47 PM   Gardiner Sleeper, M.D., Ph.D. Diseases & Surgery of the Retina and [place] 07/20/17  I have reviewed the above documentation for accuracy and completeness, and I agree with the above. Gardiner Sleeper, M.D., Ph.D. 07/20/17 4:47 PM    Abbreviations: M myopia (nearsighted); A astigmatism; H hyperopia (farsighted); P presbyopia; Mrx spectacle prescription;  CTL contact lenses; OD right eye; OS left eye; OU both eyes  XT exotropia; ET esotropia; PEK punctate epithelial keratitis; PEE punctate epithelial erosions; DES dry eye syndrome; MGD meibomian gland dysfunction; ATs artificial tears; PFAT's preservative free artificial tears; [place] nuclear sclerotic cataract; PSC posterior subcapsular cataract; ERM epi-retinal membrane; PVD posterior vitreous detachment; RD retinal detachment; DM diabetes mellitus; DR diabetic retinopathy; NPDR non-proliferative diabetic retinopathy; PDR proliferative  diabetic retinopathy; CSME clinically significant macular edema; DME diabetic macular edema; dbh dot blot hemorrhages; CWS cotton wool spot; POAG primary open angle  glaucoma; C/D cup-to-disc ratio; HVF humphrey visual field; GVF goldmann visual field; OCT optical coherence tomography; IOP intraocular pressure; BRVO Branch retinal vein occlusion; CRVO central retinal vein occlusion; CRAO central retinal artery occlusion; BRAO branch retinal artery occlusion; RT retinal tear; SB scleral buckle; PPV pars plana vitrectomy; VH Vitreous hemorrhage; PRP panretinal laser photocoagulation; IVK intravitreal kenalog; VMT vitreomacular traction; MH Macular hole;  NVD neovascularization of the disc; NVE neovascularization elsewhere; AREDS age related eye disease study; ARMD age related macular degeneration; POAG primary open angle glaucoma; EBMD epithelial/anterior basement membrane dystrophy; ACIOL anterior chamber intraocular lens; IOL intraocular lens; PCIOL posterior chamber intraocular lens; Phaco/IOL phacoemulsification with intraocular lens placement; [place] photorefractive keratectomy; LASIK laser assisted in situ keratomileusis; HTN hypertension; DM diabetes mellitus; COPD chronic obstructive pulmonary disease

## 2017-07-20 ENCOUNTER — Encounter (INDEPENDENT_AMBULATORY_CARE_PROVIDER_SITE_OTHER): Payer: Self-pay | Admitting: Ophthalmology

## 2017-07-20 ENCOUNTER — Ambulatory Visit (INDEPENDENT_AMBULATORY_CARE_PROVIDER_SITE_OTHER): Payer: Medicare Other | Admitting: Ophthalmology

## 2017-07-20 DIAGNOSIS — Z961 Presence of intraocular lens: Secondary | ICD-10-CM

## 2017-07-20 DIAGNOSIS — H3581 Retinal edema: Secondary | ICD-10-CM

## 2017-07-20 DIAGNOSIS — H02003 Unspecified entropion of right eye, unspecified eyelid: Secondary | ICD-10-CM

## 2017-07-20 DIAGNOSIS — H43813 Vitreous degeneration, bilateral: Secondary | ICD-10-CM

## 2017-07-20 DIAGNOSIS — H353112 Nonexudative age-related macular degeneration, right eye, intermediate dry stage: Secondary | ICD-10-CM

## 2017-07-20 DIAGNOSIS — H353221 Exudative age-related macular degeneration, left eye, with active choroidal neovascularization: Secondary | ICD-10-CM | POA: Diagnosis not present

## 2017-07-20 DIAGNOSIS — E113293 Type 2 diabetes mellitus with mild nonproliferative diabetic retinopathy without macular edema, bilateral: Secondary | ICD-10-CM

## 2017-07-20 MED ORDER — BEVACIZUMAB CHEMO INJECTION 1.25MG/0.05ML SYRINGE FOR KALEIDOSCOPE
1.2500 mg | INTRAVITREAL | Status: DC
  Administered 2017-07-20: 1.25 mg via INTRAVITREAL

## 2017-08-16 NOTE — Progress Notes (Signed)
[facility] Note  08/17/2017     CHIEF COMPLAINT Patient presents for Retina Follow Up   HISTORY OF PRESENT ILLNESS: [name] is a 84 y.o. female who presents to the clinic today for:   HPI    Retina Follow Up    Patient presents with  Wet AMD.  In left eye.  Severity is moderate.  Duration of 4 weeks.  Since onset it is gradually improving.  I, the attending physician,  performed the HPI with the patient and updated documentation appropriately.          Comments    4 week F/U Exu AMD OS; Pt states she feels she is able to see better; Pt states she has a single floater occasionally; Pt states OU are comfortable; Pt states she tolerated last injection well, states she is prepared to have another injection today if needed; Pt states CBG has been stable; Pt states last CBG was 116 this AM;        Last edited by [name], MD on 08/17/2017  9:44 AM. (History)      Referring physician: [name], MD [address] [place], [place] [address]  HISTORICAL INFORMATION:   Selected notes from the MEDICAL RECORD NUMBER Referred by Dr. [name] for concern of exudative AMD LEE- 02.27.19 (W. Turner) [BCVA OD: 20/40 OS: 20/70-] Ocular Hx- pseudophakia OU, lid sx (Shapiro x 24 yrs ago) PMH- DM, HTN    CURRENT MEDICATIONS: No current outpatient medications on file. (Ophthalmic Drugs)   No current facility-administered medications for this visit.  (Ophthalmic Drugs)   Current Outpatient Medications (Other)  Medication Sig  . acetaminophen (TYLENOL) 500 MG tablet Take 500 mg by mouth every 6 (six) hours as needed. For arthritis pain  . insulin glargine (LANTUS) 100 UNIT/ML injection Inject 35 Units into the skin daily.   . insulin glargine (LANTUS) 100 UNIT/ML injection   . losartan-hydrochlorothiazide (HYZAAR) 100-25 MG per tablet Take 1 tablet by mouth daily.  . metFORMIN (GLUCOPHAGE) 500 MG tablet Take 500 mg by mouth daily.  . metFORMIN  (GLUCOPHAGE-XR) 500 MG 24 hr tablet   . olmesartan (BENICAR) 20 MG tablet Take 20 mg by mouth daily.  Marland Kitchen sulfamethoxazole-trimethoprim (BACTRIM DS,SEPTRA DS) 800-160 MG tablet Take 1 tablet by mouth 2 (two) times daily. Take 1 bid   Current Facility-Administered Medications (Other)  Medication Route  . Bevacizumab (AVASTIN) SOLN 1.25 mg Intravitreal  . Bevacizumab (AVASTIN) SOLN 1.25 mg Intravitreal  . Bevacizumab (AVASTIN) SOLN 1.25 mg Intravitreal      REVIEW OF SYSTEMS: ROS    Positive for: Endocrine, Cardiovascular, Eyes   Negative for: Constitutional, Gastrointestinal, Neurological, Skin, Genitourinary, Musculoskeletal, HENT, Respiratory, Psychiatric, Allergic/Imm, Heme/Lymph   Last edited by Cherrie Gauze, COA on 08/17/2017  8:40 AM. (History)       ALLERGIES No Known Allergies  PAST MEDICAL HISTORY Past Medical History:  Diagnosis Date  . Arthritis   . Back pain 01/30/2015  . Diabetes mellitus without complication ([place])   . Hematuria 12/25/2014  . Hypertension   . PONV (postoperative nausea and vomiting)   . Urinary urgency 12/25/2014  . UTI (lower urinary tract infection) 11/02/2013   Past Surgical History:  Procedure Laterality Date  . ABDOMINAL HYSTERECTOMY  1960s   with bladder tack up  . BREAST SURGERY     benign, right  . C-EYE SURGERY PROCEDURE    . CATARACT EXTRACTION W/PHACO  02/08/2012   Procedure: CATARACT  EXTRACTION PHACO AND INTRAOCULAR LENS PLACEMENT (IOC);  Surgeon: Elta Guadeloupe T. Gershon Crane, MD;  Location: [facility];  Service: Ophthalmology;  Laterality: Left;  CDE:11.62  . EYE SURGERY    . RECTOCELE REPAIR  1985   [facility], [place]    FAMILY HISTORY Family History  Problem Relation Age of Onset  . Diabetes Mother   . Diabetes Brother   . Diabetes Brother   . Heart attack Brother   . Cancer Brother        lung  . Cancer Brother        lung  . Birth defects Brother        passed away at age 53; hole in heart  . Emphysema Brother     SOCIAL  HISTORY Social History   Tobacco Use  . Smoking status: Never Smoker  . Smokeless tobacco: Never Used  Substance Use Topics  . Alcohol use: No  . Drug use: No         OPHTHALMIC EXAM:  Base Eye Exam    Visual Acuity (Snellen - Linear)      Right Left   Dist [place] 20/100 20/60   Dist ph [place] 20/100 +2 20/50       Tonometry (Tonopen, 8:44 AM)      Right Left   Pressure 17 18       Pupils      Dark Light Shape React APD   Right 3 2 Round Brisk None   Left 3 2 Round Brisk None       Visual Fields (Counting fingers)      Left Right    Full Full       Extraocular Movement      Right Left    Full, Ortho Full, Ortho       Neuro/Psych    Oriented x3:  Yes   Mood/Affect:  Normal       Dilation    Both eyes:  1.0% Mydriacyl, 2.5% Phenylephrine @ 8:45 AM        Slit Lamp and Fundus Exam    External Exam      Right Left   External Peri orbital swelling - improved        Slit Lamp Exam      Right Left   Lids/Lashes Post-surgical periorbital edema - improving Dermatochalasis - upper lid, Telangiectasia   Conjunctiva/Sclera Mild Injection White and quiet   Cornea Arcus, Inferior 1+ Punctate epithelial erosions Arcus, Inferior 1-2+ Punctate epithelial erosions   Anterior Chamber Deep and quiet Deep and quiet   Iris Round and reactive Round and dilated   Lens PC IOL in good position, 1+ Posterior capsular opacification PC IOL in good position, 1+ non-central Posterior capsular opacification   Vitreous Posterior vitreous detachment Posterior vitreous detachment       Fundus Exam      Right Left   Disc Peripapillary atrophy and pigmenation, Pink and Sharp 360 Peripapillary atrophy   C/D Ratio 0.5 0.4   Macula Drusen, RPE mottling and clumping, Blunted foveal reflex, No heme or edema, RPE atrophy nasal to fovea peripapillary hemorrhage, +CNVM with persistent edema, Blunted foveal reflex, Drusen, RPE atrophy, mottling and clumping   Vessels Vascular attenuation  Vascular attenuation   Periphery Attached, mild peripheral drusen, Reticular degeneration Attached, blot hemorrhage nasal to disc - shrinking          IMAGING AND PROCEDURES  Imaging and Procedures for 08/17/17  OCT, Retina - OU - Both Eyes  Right Eye Quality was good. Central Foveal Thickness: 192. Progression has been stable. Findings include normal foveal contour, no IRF, no SRF, epiretinal membrane, outer retinal atrophy.   Left Eye Quality was good. Central Foveal Thickness: 422. Progression has worsened. Findings include abnormal foveal contour, subretinal fluid, intraretinal fluid, retinal drusen , subretinal hyper-reflective material (Interval improvement in SRF, but worsening IRF).   Notes *Images captured and stored on drive  Diagnosis / Impression:  Non-Exudative ARMD OD Exudative ARMD OS with interval improvement in SRF, but interval worsening in IRF  Clinical management:  See below  Abbreviations: NFP - Normal foveal profile. CME - cystoid macular edema. PED - pigment epithelial detachment. IRF - intraretinal fluid. SRF - subretinal fluid. EZ - ellipsoid zone. ERM - epiretinal membrane. ORA - outer retinal atrophy. ORT - outer retinal tubulation. SRHM - subretinal hyper-reflective material         Intravitreal Injection, Pharmacologic Agent - OS - Left Eye       Time Out 08/17/2017. 10:05 AM. Confirmed correct patient, procedure, site, and patient consented.   Anesthesia Topical anesthesia was used. Anesthetic medications included Tetracaine 0.5%, Lidocaine 2%.   Procedure Preparation included 5% betadine to ocular surface, eyelid speculum. A supplied needle was used.   Injection: 1.25 mg Bevacizumab 1.25mg /0.78ml   NDC: 80998-338-25    Lot: [id number]@25     Expiration Date: 10/01/2017   Route: Intravitreal   Site: Left Eye   Waste: 0 mg  Post-op Post injection exam found visual acuity of at least counting fingers. The patient tolerated the  procedure well. There were no complications. The patient received written and verbal post procedure care education.                 ASSESSMENT/PLAN:    ICD-10-CM   1. Exudative age-related macular degeneration of left eye with active choroidal neovascularization (HCC) H35.3221 OCT, Retina - OU - Both Eyes    Intravitreal Injection, Pharmacologic Agent - OS - Left Eye    Bevacizumab (AVASTIN) SOLN 1.25 mg  2. Intermediate stage nonexudative age-related macular degeneration of right eye H35.3112   3. Mild nonproliferative diabetic retinopathy of both eyes without macular edema associated with type 2 diabetes mellitus ([place]) [address]   4. Retinal edema H35.81   5. Posterior vitreous detachment of both eyes H43.813   6. Pseudophakia of both eyes Z96.1   7. Entropion of right eyelid H02.003     1. Exudative age related macular degeneration, OS    - The incidence pathology and anatomy of wet AMD discussed   - The ANCHOR, MARINA, CATT and VIEW trials discussed with patient.    - discussed treatment options including observation vs intravitreal anti-VEGF agents such as Avastin, Lucentis, Eylea.    - Risks of endophthalmitis and vascular occlusive events and atrophic changes discussed with patient  - S/P IVA OS #1 (03.05.19), #2 (04.03.19)  - BCVA improved to 20/50 from 20/70  - OCT with interval improvement in IRF/SRF and edema  - recommend IVA OS #3 today (05.01.19)  - pt wishes to be treated with IVA   - RBA of procedure discussed, questions answered  - informed consent obtained and signed  - see procedure note  - f/u in 4 wks  2. Age related macular degeneration, non-exudative, OD  - The incidence, anatomy, and pathology of dry AMD, risk of progression, and the AREDS and AREDS 2 study including smoking risks discussed with patient.  - Continue amsler grid monitoring  3. Mild nonproliferative  diabetic retinopathy w/o DME, both eyes - The incidence, risk factors for progression,  natural history and treatment options for diabetic retinopathy were discussed with patient.   - The need for close monitoring of blood glucose, blood pressure, and serum lipids, avoiding cigarette or any type of tobacco, and the need for long term follow up was also discussed with patient. - exam with rare IRH  - will need FA for full diabetic eval  4. Retinal edema as described above  5. PVD / vitreous syneresis OU  Discussed findings and prognosis  No RT or RD on 360 peripheral exam  Reviewed s/s of RT/RD  Strict return precautions for any such RT/RD signs/symptoms  6. Pseudophakia OU  - s/p CE/IOL  - beautiful surgey, doing well  - monitor  7. Entropion RLL-  - s/p lower lid sx x 2 by Dr. Gershon Crane - now s/p repair with Dr. Kristeen Miss - doing well, steristrips in place - management per Dr. Kristeen Miss     Ophthalmic Meds Ordered this visit:  Meds ordered this encounter  Medications  . Bevacizumab (AVASTIN) SOLN 1.25 mg       Return in about 1 month (around 09/14/2017) for f/u exu AMD OS, nonex AMD OD.  There are no Patient Instructions on file for this visit.   Explained the diagnoses, plan, and follow up with the patient and they expressed understanding.  Patient expressed understanding of the importance of proper follow up care.   This document serves as a record of services personally performed by Gardiner Sleeper, MD, PhD. It was created on their behalf by Catha Brow, [place], a certified ophthalmic assistant. The creation of this record is the provider's dictation and/or activities during the visit.  Electronically signed by: Catha Brow, [place]  08/17/17 11:00 AM   Gardiner Sleeper, M.D., Ph.D. Diseases & Surgery of the Retina and Vitreous [facility] 08/17/17  I have reviewed the above documentation for accuracy and completeness, and I agree with the above. Gardiner Sleeper, M.D., Ph.D. 08/17/17 11:00 AM    Abbreviations: M myopia  (nearsighted); A astigmatism; H hyperopia (farsighted); P presbyopia; Mrx spectacle prescription;  CTL contact lenses; OD right eye; OS left eye; OU both eyes  XT exotropia; ET esotropia; PEK punctate epithelial keratitis; PEE punctate epithelial erosions; DES dry eye syndrome; MGD meibomian gland dysfunction; ATs artificial tears; PFAT's preservative free artificial tears; [place] nuclear sclerotic cataract; PSC posterior subcapsular cataract; ERM epi-retinal membrane; PVD posterior vitreous detachment; RD retinal detachment; DM diabetes mellitus; DR diabetic retinopathy; NPDR non-proliferative diabetic retinopathy; PDR proliferative diabetic retinopathy; CSME clinically significant macular edema; DME diabetic macular edema; dbh dot blot hemorrhages; CWS cotton wool spot; POAG primary open angle glaucoma; C/D cup-to-disc ratio; HVF humphrey visual field; GVF goldmann visual field; OCT optical coherence tomography; IOP intraocular pressure; BRVO Branch retinal vein occlusion; CRVO central retinal vein occlusion; CRAO central retinal artery occlusion; BRAO branch retinal artery occlusion; RT retinal tear; SB scleral buckle; PPV pars plana vitrectomy; VH Vitreous hemorrhage; PRP panretinal laser photocoagulation; IVK intravitreal kenalog; VMT vitreomacular traction; MH Macular hole;  NVD neovascularization of the disc; NVE neovascularization elsewhere; AREDS age related eye disease study; ARMD age related macular degeneration; POAG primary open angle glaucoma; EBMD epithelial/anterior basement membrane dystrophy; ACIOL anterior chamber intraocular lens; IOL intraocular lens; PCIOL posterior chamber intraocular lens; Phaco/IOL phacoemulsification with intraocular lens placement; [place] photorefractive keratectomy; LASIK laser assisted in situ keratomileusis; HTN hypertension; DM diabetes mellitus; COPD chronic obstructive pulmonary  disease

## 2017-08-17 ENCOUNTER — Ambulatory Visit (INDEPENDENT_AMBULATORY_CARE_PROVIDER_SITE_OTHER): Payer: Medicare Other | Admitting: Ophthalmology

## 2017-08-17 ENCOUNTER — Encounter (INDEPENDENT_AMBULATORY_CARE_PROVIDER_SITE_OTHER): Payer: Self-pay | Admitting: Ophthalmology

## 2017-08-17 DIAGNOSIS — Z961 Presence of intraocular lens: Secondary | ICD-10-CM

## 2017-08-17 DIAGNOSIS — H353221 Exudative age-related macular degeneration, left eye, with active choroidal neovascularization: Secondary | ICD-10-CM

## 2017-08-17 DIAGNOSIS — H43813 Vitreous degeneration, bilateral: Secondary | ICD-10-CM

## 2017-08-17 DIAGNOSIS — H3581 Retinal edema: Secondary | ICD-10-CM

## 2017-08-17 DIAGNOSIS — H353112 Nonexudative age-related macular degeneration, right eye, intermediate dry stage: Secondary | ICD-10-CM

## 2017-08-17 DIAGNOSIS — E113293 Type 2 diabetes mellitus with mild nonproliferative diabetic retinopathy without macular edema, bilateral: Secondary | ICD-10-CM

## 2017-08-17 DIAGNOSIS — H02003 Unspecified entropion of right eye, unspecified eyelid: Secondary | ICD-10-CM

## 2017-08-17 MED ORDER — BEVACIZUMAB CHEMO INJECTION 1.25MG/0.05ML SYRINGE FOR KALEIDOSCOPE
1.2500 mg | INTRAVITREAL | Status: DC
  Administered 2017-08-17: 1.25 mg via INTRAVITREAL

## 2017-09-13 NOTE — Progress Notes (Addendum)
[facility] Note  09/14/2017     CHIEF COMPLAINT Patient presents for Retina Follow Up   HISTORY OF PRESENT ILLNESS: [name] is a 84 y.o. female who presents to the clinic today for:   HPI    Retina Follow Up    Patient presents with  Wet AMD.  In left eye.  Severity is mild.  Duration of 2 months.  Since onset it is gradually improving.  I, the attending physician,  performed the HPI with the patient and updated documentation appropriately.          Comments    F/U EXU AMD OS. Patient states her vision has improved, but "she still can not read small print". She continues to have occasional floaters OS. Bs have been stable.Pt is ready for Avastin OS if indicated.       Last edited by [name], MD on 09/14/2017  8:52 AM. (History)      Referring physician: [name], MD [address] [place], [place] [address]  HISTORICAL INFORMATION:   Selected notes from the MEDICAL RECORD NUMBER Referred by Dr. [name] for concern of exudative AMD LEE- 02.27.19 (W. Turner) [BCVA OD: 20/40 OS: 20/70-] Ocular Hx- pseudophakia OU, lid sx (Shapiro x 24 yrs ago) PMH- DM, HTN    CURRENT MEDICATIONS: No current outpatient medications on file. (Ophthalmic Drugs)   Current Facility-Administered Medications (Ophthalmic Drugs)  Medication Route  . aflibercept (EYLEA) SOLN 2 mg Intravitreal   Current Outpatient Medications (Other)  Medication Sig  . acetaminophen (TYLENOL) 500 MG tablet Take 500 mg by mouth every 6 (six) hours as needed. For arthritis pain  . insulin glargine (LANTUS) 100 UNIT/ML injection Inject 35 Units into the skin daily.   . insulin glargine (LANTUS) 100 UNIT/ML injection   . losartan-hydrochlorothiazide (HYZAAR) 100-25 MG per tablet Take 1 tablet by mouth daily.  . metFORMIN (GLUCOPHAGE) 500 MG tablet Take 500 mg by mouth daily.  . metFORMIN (GLUCOPHAGE-XR) 500 MG 24 hr tablet   . olmesartan (BENICAR) 20 MG tablet Take  20 mg by mouth daily.  Marland Kitchen sulfamethoxazole-trimethoprim (BACTRIM DS,SEPTRA DS) 800-160 MG tablet Take 1 tablet by mouth 2 (two) times daily. Take 1 bid   Current Facility-Administered Medications (Other)  Medication Route  . Bevacizumab (AVASTIN) SOLN 1.25 mg Intravitreal  . Bevacizumab (AVASTIN) SOLN 1.25 mg Intravitreal  . Bevacizumab (AVASTIN) SOLN 1.25 mg Intravitreal      REVIEW OF SYSTEMS: ROS    Positive for: Endocrine, Eyes   Negative for: Constitutional, Gastrointestinal, Neurological, Skin, Genitourinary, Musculoskeletal, HENT, Cardiovascular, Respiratory, Psychiatric, Allergic/Imm, Heme/Lymph   Last edited by Zenovia Jordan, LPN on 09/13/7822  2:35 AM. (History)       ALLERGIES No Known Allergies  PAST MEDICAL HISTORY Past Medical History:  Diagnosis Date  . Arthritis   . Back pain 01/30/2015  . Diabetes mellitus without complication ([place])   . Hematuria 12/25/2014  . Hypertension   . PONV (postoperative nausea and vomiting)   . Urinary urgency 12/25/2014  . UTI (lower urinary tract infection) 11/02/2013   Past Surgical History:  Procedure Laterality Date  . ABDOMINAL HYSTERECTOMY  1960s   with bladder tack up  . BREAST SURGERY     benign, right  . C-EYE SURGERY PROCEDURE    . CATARACT EXTRACTION W/PHACO  02/08/2012   Procedure: CATARACT EXTRACTION PHACO AND INTRAOCULAR LENS PLACEMENT (IOC);  Surgeon: Elta Guadeloupe T. Gershon Crane, MD;  Location: [facility];  Service: Ophthalmology;  Laterality: Left;  CDE:11.62  . EYE SURGERY    . RECTOCELE REPAIR  1985   [facility], [place]    FAMILY HISTORY Family History  Problem Relation Age of Onset  . Diabetes Mother   . Diabetes Brother   . Diabetes Brother   . Heart attack Brother   . Cancer Brother        lung  . Cancer Brother        lung  . Birth defects Brother        passed away at age 53; hole in heart  . Emphysema Brother     SOCIAL HISTORY Social History   Tobacco Use  . Smoking status: Never Smoker  . Smokeless  tobacco: Never Used  Substance Use Topics  . Alcohol use: No  . Drug use: No         OPHTHALMIC EXAM:  Base Eye Exam    Visual Acuity (Snellen - Linear)      Right Left   Dist [place] 20/100 20/50   Dist ph [place] 20/80 NI       Tonometry (Tonopen, 8:17 AM)      Right Left   Pressure 15 17       Pupils      Dark Light Shape React APD   Right 3 3 Round Minimal None   Left 2 2 Round Minimal None       Visual Fields (Counting fingers)      Left Right    Full Full       Extraocular Movement      Right Left    Full, Ortho Full, Ortho       Neuro/Psych    Oriented x3:  Yes   Mood/Affect:  Normal       Dilation    Both eyes:  1.0% Mydriacyl, 2.5% Phenylephrine @ 8:17 AM        Slit Lamp and Fundus Exam    Slit Lamp Exam      Right Left   Lids/Lashes Dermatochalasis - upper lid, Telangiectasia Dermatochalasis - upper lid, Telangiectasia   Conjunctiva/Sclera Mild Injection White and quiet   Cornea Arcus, Inferior 1+ Punctate epithelial erosions Arcus, Inferior 1-2+ Punctate epithelial erosions   Anterior Chamber Deep and quiet Deep and quiet   Iris Round and reactive Round and dilated   Lens PC IOL in good position, 1+ Posterior capsular opacification PC IOL in good position, 1+ non-central Posterior capsular opacification   Vitreous Posterior vitreous detachment Posterior vitreous detachment       Fundus Exam      Right Left   Disc Peripapillary atrophy and pigmenation, Pink and Sharp 360 Peripapillary atrophy   C/D Ratio 0.5 0.4   Macula Drusen, RPE mottling and clumping, Blunted foveal reflex, No heme or edema, RPE atrophy nasal to fovea peripapillary hemorrhage, +CNVM with persistent edema -- worse, Blunted foveal reflex, Drusen, RPE atrophy, mottling and clumping   Vessels Vascular attenuation Vascular attenuation   Periphery Attached, mild peripheral drusen, Reticular degeneration Attached          IMAGING AND PROCEDURES  Imaging and Procedures for  08/17/17  OCT, Retina - OU - Both Eyes       Right Eye Quality was good. Central Foveal Thickness: 189. Progression has been stable. Findings include normal foveal contour, no IRF, no SRF, epiretinal membrane, outer retinal atrophy.   Left Eye Quality was good. Central Foveal Thickness: 487. Progression has worsened. Findings include abnormal foveal contour, subretinal  fluid, intraretinal fluid, retinal drusen , subretinal hyper-reflective material (Interval worsening of IRF).   Notes *Images captured and stored on drive  Diagnosis / Impression:  Non-Exudative ARMD OD Exudative ARMD OS with interval worsening in IRF  Clinical management:  See below  Abbreviations: NFP - Normal foveal profile. CME - cystoid macular edema. PED - pigment epithelial detachment. IRF - intraretinal fluid. SRF - subretinal fluid. EZ - ellipsoid zone. ERM - epiretinal membrane. ORA - outer retinal atrophy. ORT - outer retinal tubulation. SRHM - subretinal hyper-reflective material         Intravitreal Injection, Pharmacologic Agent - OS - Left Eye       Time Out 09/14/2017. 9:28 AM. Confirmed correct patient, procedure, site, and patient consented.   Anesthesia Topical anesthesia was used. Anesthetic medications included Lidocaine 2%, Proparacaine 0.5%.   Procedure Preparation included 5% betadine to ocular surface, eyelid speculum. A supplied needle was used.   Injection: 2 mg aflibercept 2 MG/0.05ML   NDC: 56213-086-57    Lot: [id number]    Expiration Date: 04/18/2018   Route: Intravitreal   Site: Left Eye   Waste: 0.05 mg  Post-op Post injection exam found visual acuity of at least counting fingers. The patient tolerated the procedure well. There were no complications. The patient received written and verbal post procedure care education.   Notes *Sample medication given*                ASSESSMENT/PLAN:    ICD-10-CM   1. Exudative age-related macular degeneration of left  eye with active choroidal neovascularization (HCC) H35.3221 Intravitreal Injection, Pharmacologic Agent - OS - Left Eye    aflibercept (EYLEA) SOLN 2 mg  2. Intermediate stage nonexudative age-related macular degeneration of right eye H35.3112   3. Mild nonproliferative diabetic retinopathy of both eyes without macular edema associated with type 2 diabetes mellitus ([place]) U13.2440   4. Retinal edema H35.81 OCT, Retina - OU - Both Eyes  5. Posterior vitreous detachment of both eyes H43.813   6. Pseudophakia of both eyes Z96.1   7. Entropion of right eyelid H02.003     1. Exudative age related macular degeneration, OS    - The incidence pathology and anatomy of wet AMD discussed   - The ANCHOR, MARINA, CATT and VIEW trials discussed with patient.    - discussed treatment options including observation vs intravitreal anti-VEGF agents such as Avastin, Lucentis, Eylea.    - Risks of endophthalmitis and vascular occlusive events and atrophic changes discussed with patient  - S/P IVA OS #1 (03.05.19), #2 (04.03.19), #3 (05.01.19)  - BCVA stable today at 20/50 from 20/70  - OCT with interval worsening of IRF / edema  - recommend switching to IVE OS  - pt wishes to be treated with IVE OS #1 today (5.29.19) -- will give sample  - RBA of procedure discussed, questions answered  - informed consent obtained and signed  - see procedure note -- sample medication given without complication  - will have pt sign benefits investigation paperwork for [id number]  - f/u in 4 wks  2. Age related macular degeneration, non-exudative, OD  - The incidence, anatomy, and pathology of dry AMD, risk of progression, and the AREDS and AREDS 2 study including smoking risks discussed with patient.  - stable  - Continue amsler grid monitoring  3. Mild nonproliferative diabetic retinopathy w/o DME, both eyes - The incidence, risk factors for progression, natural history and treatment options for diabetic retinopathy were  discussed  with patient.   - The need for close monitoring of blood glucose, blood pressure, and serum lipids, avoiding cigarette or any type of tobacco, and the need for long term follow up was also discussed with patient. - exam with rare IRH  - will need FA for full diabetic eval -- recommend FA when #1 is stable  4.Retinal edema as described above  5. PVD / vitreous syneresis OU  Discussed findings and prognosis  No RT or RD on 360 peripheral exam  Reviewed s/s of RT/RD  Strict return precautions for any such RT/RD signs/symptoms  6. Pseudophakia OU  - s/p CE/IOL  - beautiful surgey, doing well  - monitor  7. Entropion RLL-  - s/p lower lid sx x 2 by Dr. Gershon Crane - now s/p repair with Dr. Kristeen Miss - doing well - management per Dr. Kristeen Miss     Ophthalmic Meds Ordered this visit:  Meds ordered this encounter  Medications  . aflibercept (EYLEA) SOLN 2 mg       Return in about 1 month (around 10/12/2017) for Dilated Exam, OCT, Possible Injxn OS.  There are no Patient Instructions on file for this visit.   Explained the diagnoses, plan, and follow up with the patient and they expressed understanding.  Patient expressed understanding of the importance of proper follow up care.   This document serves as a record of services personally performed by Gardiner Sleeper, MD, PhD. It was created on their behalf by Catha Brow, [place], a certified ophthalmic assistant. The creation of this record is the provider's dictation and/or activities during the visit.  Electronically signed by: Catha Brow, COA  05.28.19 11:27 AM   Gardiner Sleeper, M.D., Ph.D. Diseases & Surgery of the Retina and Vitreous [facility] 09/13/17  I have reviewed the above documentation for accuracy and completeness, and I agree with the above. Gardiner Sleeper, M.D., Ph.D. 09/14/17 11:27 AM    Abbreviations: M myopia (nearsighted); A astigmatism; H hyperopia (farsighted); P  presbyopia; Mrx spectacle prescription;  CTL contact lenses; OD right eye; OS left eye; OU both eyes  XT exotropia; ET esotropia; PEK punctate epithelial keratitis; PEE punctate epithelial erosions; DES dry eye syndrome; MGD meibomian gland dysfunction; ATs artificial tears; PFAT's preservative free artificial tears; [place] nuclear sclerotic cataract; PSC posterior subcapsular cataract; ERM epi-retinal membrane; PVD posterior vitreous detachment; RD retinal detachment; DM diabetes mellitus; DR diabetic retinopathy; NPDR non-proliferative diabetic retinopathy; PDR proliferative diabetic retinopathy; CSME clinically significant macular edema; DME diabetic macular edema; dbh dot blot hemorrhages; CWS cotton wool spot; POAG primary open angle glaucoma; C/D cup-to-disc ratio; HVF humphrey visual field; GVF goldmann visual field; OCT optical coherence tomography; IOP intraocular pressure; BRVO Branch retinal vein occlusion; CRVO central retinal vein occlusion; CRAO central retinal artery occlusion; BRAO branch retinal artery occlusion; RT retinal tear; SB scleral buckle; PPV pars plana vitrectomy; VH Vitreous hemorrhage; PRP panretinal laser photocoagulation; IVK intravitreal kenalog; VMT vitreomacular traction; MH Macular hole;  NVD neovascularization of the disc; NVE neovascularization elsewhere; AREDS age related eye disease study; ARMD age related macular degeneration; POAG primary open angle glaucoma; EBMD epithelial/anterior basement membrane dystrophy; ACIOL anterior chamber intraocular lens; IOL intraocular lens; PCIOL posterior chamber intraocular lens; Phaco/IOL phacoemulsification with intraocular lens placement; [place] photorefractive keratectomy; LASIK laser assisted in situ keratomileusis; HTN hypertension; DM diabetes mellitus; COPD chronic obstructive pulmonary disease

## 2017-09-14 ENCOUNTER — Ambulatory Visit (INDEPENDENT_AMBULATORY_CARE_PROVIDER_SITE_OTHER): Payer: Medicare Other | Admitting: Ophthalmology

## 2017-09-14 ENCOUNTER — Encounter (INDEPENDENT_AMBULATORY_CARE_PROVIDER_SITE_OTHER): Payer: Self-pay | Admitting: Ophthalmology

## 2017-09-14 DIAGNOSIS — H02003 Unspecified entropion of right eye, unspecified eyelid: Secondary | ICD-10-CM

## 2017-09-14 DIAGNOSIS — H3581 Retinal edema: Secondary | ICD-10-CM | POA: Diagnosis not present

## 2017-09-14 DIAGNOSIS — H43813 Vitreous degeneration, bilateral: Secondary | ICD-10-CM

## 2017-09-14 DIAGNOSIS — Z961 Presence of intraocular lens: Secondary | ICD-10-CM | POA: Diagnosis not present

## 2017-09-14 DIAGNOSIS — E113293 Type 2 diabetes mellitus with mild nonproliferative diabetic retinopathy without macular edema, bilateral: Secondary | ICD-10-CM | POA: Diagnosis not present

## 2017-09-14 DIAGNOSIS — H353112 Nonexudative age-related macular degeneration, right eye, intermediate dry stage: Secondary | ICD-10-CM

## 2017-09-14 DIAGNOSIS — H353221 Exudative age-related macular degeneration, left eye, with active choroidal neovascularization: Secondary | ICD-10-CM | POA: Diagnosis not present

## 2017-09-14 MED ORDER — AFLIBERCEPT 2MG/0.05ML IZ SOLN FOR KALEIDOSCOPE
2.0000 mg | INTRAVITREAL | Status: DC
  Administered 2017-09-14: 2 mg via INTRAVITREAL

## 2017-09-27 DIAGNOSIS — N183 Chronic kidney disease, stage 3 (moderate): Secondary | ICD-10-CM | POA: Diagnosis not present

## 2017-09-27 DIAGNOSIS — E1139 Type 2 diabetes mellitus with other diabetic ophthalmic complication: Secondary | ICD-10-CM | POA: Diagnosis not present

## 2017-09-27 DIAGNOSIS — Z79899 Other long term (current) drug therapy: Secondary | ICD-10-CM | POA: Diagnosis not present

## 2017-09-27 DIAGNOSIS — I10 Essential (primary) hypertension: Secondary | ICD-10-CM | POA: Diagnosis not present

## 2017-09-27 DIAGNOSIS — E1129 Type 2 diabetes mellitus with other diabetic kidney complication: Secondary | ICD-10-CM | POA: Diagnosis not present

## 2017-10-03 DIAGNOSIS — E1139 Type 2 diabetes mellitus with other diabetic ophthalmic complication: Secondary | ICD-10-CM | POA: Diagnosis not present

## 2017-10-03 DIAGNOSIS — N183 Chronic kidney disease, stage 3 (moderate): Secondary | ICD-10-CM | POA: Diagnosis not present

## 2017-10-03 DIAGNOSIS — I10 Essential (primary) hypertension: Secondary | ICD-10-CM | POA: Diagnosis not present

## 2017-10-11 NOTE — Progress Notes (Signed)
[facility] Note  10/12/2017     CHIEF COMPLAINT Patient presents for Retina Follow Up   HISTORY OF PRESENT ILLNESS: [name] is a 84 y.o. female who presents to the clinic today for:   HPI    Retina Follow Up    Patient presents with  Wet AMD.  In left eye.  Severity is moderate.  Duration of 4 weeks.  Since onset it is stable.  I, the attending physician,  performed the HPI with the patient and updated documentation appropriately.          Comments    Pt presents for exu ARMD OS F/U, pt states her VA has been good and she no longer has floaters, pt denies flashes, wavy vision or pain, pt is using Soothe gtts, pt has not checked BS today,        Last edited by [name], MD on 10/16/2017 11:51 PM. (History)      Referring physician: [name], MD [address] [place], [place] [address]  HISTORICAL INFORMATION:   Selected notes from the MEDICAL RECORD NUMBER Referred by Dr. [name] for concern of exudative AMD LEE- 02.27.19 (W. [facility]) [BCVA OD: 20/40 OS: 20/70-] Ocular Hx- pseudophakia OU, lid sx (Shapiro x 24 yrs ago) PMH- DM, HTN    CURRENT MEDICATIONS: No current outpatient medications on file. (Ophthalmic Drugs)   Current Facility-Administered Medications (Ophthalmic Drugs)  Medication Route  . aflibercept (EYLEA) SOLN 2 mg Intravitreal  . aflibercept (EYLEA) SOLN 2 mg Intravitreal   Current Outpatient Medications (Other)  Medication Sig  . acetaminophen (TYLENOL) 500 MG tablet Take 500 mg by mouth every 6 (six) hours as needed. For arthritis pain  . insulin glargine (LANTUS) 100 UNIT/ML injection Inject 35 Units into the skin daily.   . insulin glargine (LANTUS) 100 UNIT/ML injection   . losartan-hydrochlorothiazide (HYZAAR) 100-25 MG per tablet Take 1 tablet by mouth daily.  . metFORMIN (GLUCOPHAGE) 500 MG tablet Take 500 mg by mouth daily.  . metFORMIN (GLUCOPHAGE-XR) 500 MG 24 hr tablet   . olmesartan  (BENICAR) 20 MG tablet Take 20 mg by mouth daily.  Marland Kitchen sulfamethoxazole-trimethoprim (BACTRIM DS,SEPTRA DS) 800-160 MG tablet Take 1 tablet by mouth 2 (two) times daily. Take 1 bid   Current Facility-Administered Medications (Other)  Medication Route  . Bevacizumab (AVASTIN) SOLN 1.25 mg Intravitreal  . Bevacizumab (AVASTIN) SOLN 1.25 mg Intravitreal  . Bevacizumab (AVASTIN) SOLN 1.25 mg Intravitreal      REVIEW OF SYSTEMS: ROS    Positive for: Musculoskeletal, Endocrine, Cardiovascular, Eyes   Negative for: Constitutional, Gastrointestinal, Neurological, Skin, Genitourinary, HENT, Respiratory, Psychiatric, Allergic/Imm, Heme/Lymph   Last edited by Debbrah Alar, COT on 10/12/2017  2:38 PM. (History)       ALLERGIES No Known Allergies  PAST MEDICAL HISTORY Past Medical History:  Diagnosis Date  . Arthritis   . Back pain 01/30/2015  . Diabetes mellitus without complication ([place])   . Hematuria 12/25/2014  . Hypertension   . PONV (postoperative nausea and vomiting)   . Urinary urgency 12/25/2014  . UTI (lower urinary tract infection) 11/02/2013   Past Surgical History:  Procedure Laterality Date  . ABDOMINAL HYSTERECTOMY  1960s   with bladder tack up  . BREAST SURGERY     benign, right  . C-EYE SURGERY PROCEDURE    . CATARACT EXTRACTION W/PHACO  02/08/2012   Procedure: CATARACT EXTRACTION PHACO AND INTRAOCULAR LENS PLACEMENT (IOC);  Surgeon: Elta Guadeloupe T.  Gershon Crane, MD;  Location: [facility];  Service: Ophthalmology;  Laterality: Left;  CDE:11.62  . EYE SURGERY    . RECTOCELE REPAIR  1985   [facility], [place]    FAMILY HISTORY Family History  Problem Relation Age of Onset  . Diabetes Mother   . Diabetes Brother   . Diabetes Brother   . Heart attack Brother   . Cancer Brother        lung  . Cancer Brother        lung  . Birth defects Brother        passed away at age 30; hole in heart  . Emphysema Brother     SOCIAL HISTORY Social History   Tobacco Use  . Smoking status:  Never Smoker  . Smokeless tobacco: Never Used  Substance Use Topics  . Alcohol use: No  . Drug use: No         OPHTHALMIC EXAM:  Base Eye Exam    Visual Acuity (Snellen - Linear)      Right Left   Dist [place] 20/60 20/50 -2   Dist ph [place] 20/50 NI       Tonometry (Tonopen, 2:43 PM)      Right Left   Pressure 14 13       Pupils      Dark Light Shape React APD   Right 2 1.5 Round Brisk None   Left 2 1.5 Round Brisk None       Visual Fields (Counting fingers)      Left Right    Full Full       Extraocular Movement      Right Left    Full, Ortho Full, Ortho       Neuro/Psych    Oriented x3:  Yes   Mood/Affect:  Normal       Dilation    Both eyes:  1.0% Mydriacyl, 2.5% Phenylephrine @ 2:43 PM        Slit Lamp and Fundus Exam    Slit Lamp Exam      Right Left   Lids/Lashes Dermatochalasis - upper lid, Telangiectasia Dermatochalasis - upper lid, Telangiectasia   Conjunctiva/Sclera Mild Injection White and quiet   Cornea Arcus, Inferior 1+ Punctate epithelial erosions Arcus, Inferior 1-2+ Punctate epithelial erosions   Anterior Chamber Deep and quiet Deep and quiet   Iris Round and reactive Round and dilated   Lens PC IOL in good position, 1+ Posterior capsular opacification PC IOL in good position, 1+ non-central Posterior capsular opacification   Vitreous Posterior vitreous detachment Posterior vitreous detachment       Fundus Exam      Right Left   Disc Peripapillary atrophy and pigmenation, Pink and Sharp 360 Peripapillary atrophy   C/D Ratio 0.5 0.4   Macula Drusen, RPE mottling and clumping, Blunted foveal reflex, No heme or edema, RPE atrophy nasal to fovea peripapillary hemorrhage - clearing, +CNVM with persistent edema -- improved, Blunted foveal reflex, Drusen, RPE atrophy, mottling and clumping   Vessels Vascular attenuation Vascular attenuation   Periphery Attached, mild peripheral drusen, Reticular degeneration Attached          IMAGING AND  PROCEDURES  Imaging and Procedures for 08/17/17  OCT, Retina - OU - Both Eyes       Right Eye Quality was poor. Central Foveal Thickness: 129. Progression has been stable. Findings include normal foveal contour, no IRF, no SRF, epiretinal membrane, outer retinal atrophy.   Left Eye Quality was good.  Central Foveal Thickness: 251. Progression has improved. Findings include retinal drusen , subretinal hyper-reflective material, normal foveal contour, no IRF, no SRF, pigment epithelial detachment (Interval improvement / resolution of IRF, interval improvement in SRHM/PED).   Notes *Images captured and stored on drive  Diagnosis / Impression:  Non-Exudative ARMD OD Exudative ARMD OS with interval improvement / resolution in IRF, interval improvement in [facility] / PED;   Clinical management:  See below  Abbreviations: NFP - Normal foveal profile. CME - cystoid macular edema. PED - pigment epithelial detachment. IRF - intraretinal fluid. SRF - subretinal fluid. EZ - ellipsoid zone. ERM - epiretinal membrane. ORA - outer retinal atrophy. ORT - outer retinal tubulation. SRHM - subretinal hyper-reflective material         Intravitreal Injection, Pharmacologic Agent - OS - Left Eye       Time Out 10/12/2017. 4:03 PM. Confirmed correct patient, procedure, site, and patient consented.   Anesthesia Anesthetic medications included Tetracaine 0.5%.   Procedure Preparation included eyelid speculum, 5% betadine to ocular surface. A supplied needle was used.   Injection: 2 mg aflibercept 2 MG/0.05ML   NDC: 27782-423-53    Lot: [id number]    Expiration Date: 06/14/2018   Route: Intravitreal   Site: Left Eye   Waste: 0.5 mg  Post-op Post injection exam found visual acuity of at least counting fingers. The patient tolerated the procedure well. There were no complications. The patient received written and verbal post procedure care education.                 ASSESSMENT/PLAN:     ICD-10-CM   1. Exudative age-related macular degeneration of left eye with active choroidal neovascularization (HCC) H35.3221 Intravitreal Injection, Pharmacologic Agent - OS - Left Eye    aflibercept (EYLEA) SOLN 2 mg    CANCELED: Intravitreal Injection, Pharmacologic Agent - OS - Left Eye  2. Intermediate stage nonexudative age-related macular degeneration of right eye H35.3112   3. Mild nonproliferative diabetic retinopathy of both eyes without macular edema associated with type 2 diabetes mellitus ([place]) Q67.6195   4. Retinal edema H35.81 OCT, Retina - OU - Both Eyes  5. Posterior vitreous detachment of both eyes H43.813   6. Pseudophakia of both eyes Z96.1   7. Entropion of right eyelid H02.003     1. Exudative age related macular degeneration, OS    - The incidence pathology and anatomy of wet AMD discussed   - The ANCHOR, MARINA, CATT and VIEW trials discussed with patient.    - discussed treatment options including observation vs intravitreal anti-VEGF agents such as Avastin, Lucentis, Eylea.    - Risks of endophthalmitis and vascular occlusive events and atrophic changes discussed with patient  - S/P IVA OS #1 (03.05.19), #2 (04.03.19), #3 (05.01.19)  - S/P IVE OS #1 (05.29.19) -- Good Days approved  - BCVA stable today at 20/50 from 20/70  - OCT with interval improvement IRF/edema, and PED/SRHM -- good response to initial IVE  - recommend IVE OS #2  - pt wishes to be treated with IVE OS #2 (06.26.19)  - RBA of procedure discussed, questions answered  - informed consent obtained and signed  - see procedure note  - Eylea4U paperwork completed -- Good Days approved  - f/u 5 weeks -- DFE/OCT/FA  2. Age related macular degeneration, non-exudative, OD  - The incidence, anatomy, and pathology of dry AMD, risk of progression, and the AREDS and AREDS 2 study including smoking risks discussed with patient.  -  stable  - continue amsler grid monitoring  3. Mild nonproliferative  diabetic retinopathy w/o DME, both eyes - The incidence, risk factors for progression, natural history and treatment options for diabetic retinopathy were discussed with patient.   - The need for close monitoring of blood glucose, blood pressure, and serum lipids, avoiding cigarette or any type of tobacco, and the need for long term follow up was also discussed with patient. - exam with rare IRH  - will need FA for full diabetic eval -- recommend FA when #1 is stable  4.Retinal edema as described above  5. PVD / vitreous syneresis OU  Discussed findings and prognosis  No RT or RD on 360 peripheral exam  Reviewed s/s of RT/RD  Strict return precautions for any such RT/RD signs/symptoms  6. Pseudophakia OU  - s/p CE/IOL  - beautiful surgey, doing well  - monitor  7. Entropion RLL-  - s/p lower lid sx x 2 by Dr. Gershon Crane - now s/p repair with Dr. Kristeen Miss - doing well -management per Dr. Kristeen Miss  Ophthalmic Meds Ordered this visit:  Meds ordered this encounter  Medications  . aflibercept (EYLEA) SOLN 2 mg       Return in about 5 weeks (around 11/16/2017) for F/U Exu AMD OS, DFE, OCT, Fluorescein Angiogram.  There are no Patient Instructions on file for this visit.   Explained the diagnoses, plan, and follow up with the patient and they expressed understanding.  Patient expressed understanding of the importance of proper follow up care.   This document serves as a record of services personally performed by Gardiner Sleeper, MD, PhD. It was created on their behalf by Ernest Mallick, OA, an ophthalmic assistant. The creation of this record is the provider's dictation and/or activities during the visit.    Electronically signed by: Ernest Mallick, OA  06.25.2019 12:08 AM    Gardiner Sleeper, M.D., Ph.D. Diseases & Surgery of the Retina and Vitreous [facility]  I have reviewed the above documentation for accuracy and completeness, and I agree with the above. Gardiner Sleeper, M.D., Ph.D. 10/17/17 12:08 AM   Abbreviations: M myopia (nearsighted); A astigmatism; H hyperopia (farsighted); P presbyopia; Mrx spectacle prescription;  CTL contact lenses; OD right eye; OS left eye; OU both eyes  XT exotropia; ET esotropia; PEK punctate epithelial keratitis; PEE punctate epithelial erosions; DES dry eye syndrome; MGD meibomian gland dysfunction; ATs artificial tears; PFAT's preservative free artificial tears; [place] nuclear sclerotic cataract; PSC posterior subcapsular cataract; ERM epi-retinal membrane; PVD posterior vitreous detachment; RD retinal detachment; DM diabetes mellitus; DR diabetic retinopathy; NPDR non-proliferative diabetic retinopathy; PDR proliferative diabetic retinopathy; CSME clinically significant macular edema; DME diabetic macular edema; dbh dot blot hemorrhages; CWS cotton wool spot; POAG primary open angle glaucoma; C/D cup-to-disc ratio; HVF humphrey visual field; GVF goldmann visual field; OCT optical coherence tomography; IOP intraocular pressure; BRVO Branch retinal vein occlusion; CRVO central retinal vein occlusion; CRAO central retinal artery occlusion; BRAO branch retinal artery occlusion; RT retinal tear; SB scleral buckle; PPV pars plana vitrectomy; VH Vitreous hemorrhage; PRP panretinal laser photocoagulation; IVK intravitreal kenalog; VMT vitreomacular traction; MH Macular hole;  NVD neovascularization of the disc; NVE neovascularization elsewhere; AREDS age related eye disease study; ARMD age related macular degeneration; POAG primary open angle glaucoma; EBMD epithelial/anterior basement membrane dystrophy; ACIOL anterior chamber intraocular lens; IOL intraocular lens; PCIOL posterior chamber intraocular lens; Phaco/IOL phacoemulsification with intraocular lens placement; PRK photorefractive keratectomy; LASIK laser assisted in  situ keratomileusis; HTN hypertension; DM diabetes mellitus; COPD chronic obstructive pulmonary disease

## 2017-10-12 ENCOUNTER — Encounter (INDEPENDENT_AMBULATORY_CARE_PROVIDER_SITE_OTHER): Payer: Self-pay | Admitting: Ophthalmology

## 2017-10-12 ENCOUNTER — Ambulatory Visit (INDEPENDENT_AMBULATORY_CARE_PROVIDER_SITE_OTHER): Payer: Medicare Other | Admitting: Ophthalmology

## 2017-10-12 DIAGNOSIS — Z961 Presence of intraocular lens: Secondary | ICD-10-CM

## 2017-10-12 DIAGNOSIS — H02003 Unspecified entropion of right eye, unspecified eyelid: Secondary | ICD-10-CM | POA: Diagnosis not present

## 2017-10-12 DIAGNOSIS — H353221 Exudative age-related macular degeneration, left eye, with active choroidal neovascularization: Secondary | ICD-10-CM

## 2017-10-12 DIAGNOSIS — H353112 Nonexudative age-related macular degeneration, right eye, intermediate dry stage: Secondary | ICD-10-CM | POA: Diagnosis not present

## 2017-10-12 DIAGNOSIS — H43813 Vitreous degeneration, bilateral: Secondary | ICD-10-CM | POA: Diagnosis not present

## 2017-10-12 DIAGNOSIS — H3581 Retinal edema: Secondary | ICD-10-CM | POA: Diagnosis not present

## 2017-10-12 DIAGNOSIS — E113293 Type 2 diabetes mellitus with mild nonproliferative diabetic retinopathy without macular edema, bilateral: Secondary | ICD-10-CM | POA: Diagnosis not present

## 2017-10-16 DIAGNOSIS — H43813 Vitreous degeneration, bilateral: Secondary | ICD-10-CM | POA: Diagnosis not present

## 2017-10-16 DIAGNOSIS — H353221 Exudative age-related macular degeneration, left eye, with active choroidal neovascularization: Secondary | ICD-10-CM | POA: Diagnosis not present

## 2017-10-16 DIAGNOSIS — H3581 Retinal edema: Secondary | ICD-10-CM | POA: Diagnosis not present

## 2017-10-16 MED ORDER — AFLIBERCEPT 2MG/0.05ML IZ SOLN FOR KALEIDOSCOPE
2.0000 mg | INTRAVITREAL | Status: DC
  Administered 2017-10-16: 2 mg via INTRAVITREAL

## 2017-10-17 ENCOUNTER — Encounter (INDEPENDENT_AMBULATORY_CARE_PROVIDER_SITE_OTHER): Payer: Self-pay | Admitting: Ophthalmology

## 2017-11-14 NOTE — Progress Notes (Signed)
[facility] Note  11/16/2017     CHIEF COMPLAINT Patient presents for Retina Follow Up   HISTORY OF PRESENT ILLNESS: [name] is a 84 y.o. female who presents to the clinic today for:   HPI    Retina Follow Up    Patient presents with  Wet AMD.  In left eye.  This started months ago.  Severity is moderate.  Duration of months.  Since onset it is stable.  I, the attending physician,  performed the HPI with the patient and updated documentation appropriately.          Comments    84 y/o female pt here for 5 wk f/u for wet AMD OS.  Pt also has dry AMD OD, and NPDR OU.  No change in vision OU.  Denies pain, flashes.  Floaters are not as prevalent OS.  Soothe bid OD.  BS 150 yesterday HgA1C 7.8 - last month       Last edited by [name], MD on 11/16/2017  5:16 PM. (History)      Referring physician: [name], MD [address] [place], [place] [address]  HISTORICAL INFORMATION:   Selected notes from the MEDICAL RECORD NUMBER Referred by Dr. [name] for concern of exudative AMD LEE- 02.27.19 (W. Turner) [BCVA OD: 20/40 OS: 20/70-] Ocular Hx- pseudophakia OU, lid sx (Shapiro x 24 yrs ago) PMH- DM, HTN    CURRENT MEDICATIONS: No current outpatient medications on file. (Ophthalmic Drugs)   Current Facility-Administered Medications (Ophthalmic Drugs)  Medication Route  . aflibercept (EYLEA) SOLN 2 mg Intravitreal  . aflibercept (EYLEA) SOLN 2 mg Intravitreal  . aflibercept (EYLEA) SOLN 2 mg Intravitreal   Current Outpatient Medications (Other)  Medication Sig  . acetaminophen (TYLENOL) 500 MG tablet Take 500 mg by mouth every 6 (six) hours as needed. For arthritis pain  . insulin glargine (LANTUS) 100 UNIT/ML injection Inject 35 Units into the skin daily.   . insulin glargine (LANTUS) 100 UNIT/ML injection   . losartan-hydrochlorothiazide (HYZAAR) 100-25 MG per tablet Take 1 tablet by mouth daily.  . metFORMIN (GLUCOPHAGE)  500 MG tablet Take 500 mg by mouth daily.  . metFORMIN (GLUCOPHAGE-XR) 500 MG 24 hr tablet   . olmesartan (BENICAR) 20 MG tablet Take 20 mg by mouth daily.  Marland Kitchen sulfamethoxazole-trimethoprim (BACTRIM DS,SEPTRA DS) 800-160 MG tablet Take 1 tablet by mouth 2 (two) times daily. Take 1 bid   Current Facility-Administered Medications (Other)  Medication Route  . Bevacizumab (AVASTIN) SOLN 1.25 mg Intravitreal  . Bevacizumab (AVASTIN) SOLN 1.25 mg Intravitreal  . Bevacizumab (AVASTIN) SOLN 1.25 mg Intravitreal      REVIEW OF SYSTEMS: ROS    Positive for: Endocrine, Eyes   Negative for: Constitutional, Gastrointestinal, Neurological, Skin, Genitourinary, Musculoskeletal, HENT, Cardiovascular, Respiratory, Psychiatric, Allergic/Imm, Heme/Lymph   Last edited by Matthew Folks, COA on 11/16/2017  2:02 PM. (History)       ALLERGIES No Known Allergies  PAST MEDICAL HISTORY Past Medical History:  Diagnosis Date  . Arthritis   . Back pain 01/30/2015  . Diabetes mellitus without complication ([place])   . Hematuria 12/25/2014  . Hypertension   . PONV (postoperative nausea and vomiting)   . Urinary urgency 12/25/2014  . UTI (lower urinary tract infection) 11/02/2013   Past Surgical History:  Procedure Laterality Date  . ABDOMINAL HYSTERECTOMY  1960s   with bladder tack up  . BREAST SURGERY     benign, right  .  C-EYE SURGERY PROCEDURE    . CATARACT EXTRACTION W/PHACO  02/08/2012   Procedure: CATARACT EXTRACTION PHACO AND INTRAOCULAR LENS PLACEMENT (IOC);  Surgeon: Elta Guadeloupe T. Gershon Crane, MD;  Location: [facility];  Service: Ophthalmology;  Laterality: Left;  CDE:11.62  . EYE SURGERY    . RECTOCELE REPAIR  1985   [facility], [place]    FAMILY HISTORY Family History  Problem Relation Age of Onset  . Diabetes Mother   . Diabetes Brother   . Diabetes Brother   . Heart attack Brother   . Cancer Brother        lung  . Cancer Brother        lung  . Birth defects Brother        passed away at age 66;  hole in heart  . Emphysema Brother     SOCIAL HISTORY Social History   Tobacco Use  . Smoking status: Never Smoker  . Smokeless tobacco: Never Used  Substance Use Topics  . Alcohol use: No  . Drug use: No         OPHTHALMIC EXAM:  Base Eye Exam    Visual Acuity (Snellen - Linear)      Right Left   Dist [place] 20/50 -2 20/70 +2   Dist ph [place] 20/40 -2 20/60 -2       Tonometry (Tonopen, 2:09 PM)      Right Left   Pressure 17 18       Pupils      Dark Light Shape React APD   Right 2 1.5 Round Brisk None   Left 2 1.5 Round Brisk None       Visual Fields (Counting fingers)      Left Right    Full Full       Extraocular Movement      Right Left    Full, Ortho Full, Ortho       Neuro/Psych    Oriented x3:  Yes   Mood/Affect:  Normal       Dilation    Both eyes:  1.0% Mydriacyl, 2.5% Phenylephrine @ 2:09 PM        Slit Lamp and Fundus Exam    Slit Lamp Exam      Right Left   Lids/Lashes Dermatochalasis - upper lid, Telangiectasia, UL edema, UL erythemia , Ptosis Dermatochalasis - upper lid, Telangiectasia, Meibomian gland dysfunction   Conjunctiva/Sclera Mild Injection White and quiet   Cornea Arcus, Inferior 2+ Punctate epithelial erosions Arcus, Inferior 3+ Punctate epithelial erosions   Anterior Chamber Deep and quiet Deep and quiet   Iris Patent peripheral iridectomy at 0900, 360 peripapillary Transillumination defects at 0430 and 0800 to 0900 Round and dilated   Lens PC IOL in good position, 1+ Posterior capsular opacification PC IOL in good position, mild PC fold, 1+ non-central Posterior capsular opacification   Vitreous Posterior vitreous detachment Posterior vitreous detachment       Fundus Exam      Right Left   Disc Peripapillary atrophy and pigmenation, Pink and Sharp 360 Peripapillary atrophy, Pallor   C/D Ratio 0.2 0.4   Macula Drusen, RPE mottling and clumping, Blunted foveal reflex, No heme or edema, RPE atrophy nasal to fovea peripapillary  hemorrhage - resolved, +CNVM with persistent edema -- improved, Blunted foveal reflex, Drusen, RPE atrophy, mottling and clumping   Vessels Vascular attenuation Vascular attenuation   Periphery Attached, mild peripheral drusen, Reticular degeneration Attached          IMAGING AND  PROCEDURES  Imaging and Procedures for 08/17/17  OCT, Retina - OU - Both Eyes       Right Eye Quality was good. Central Foveal Thickness: 192. Progression has been stable. Findings include normal foveal contour, no IRF, no SRF, epiretinal membrane, outer retinal atrophy.   Left Eye Quality was good. Central Foveal Thickness: 311. Progression has improved. Findings include retinal drusen , subretinal hyper-reflective material, normal foveal contour, no IRF, no SRF, pigment epithelial detachment, outer retinal atrophy (interval improvement in SRHM/PED).   Notes *Images captured and stored on drive  Diagnosis / Impression:  Non-Exudative ARMD OD Exudative ARMD OS with interval improvement in [facility] / PED; significant ORA  Clinical management:  See below  Abbreviations: NFP - Normal foveal profile. CME - cystoid macular edema. PED - pigment epithelial detachment. IRF - intraretinal fluid. SRF - subretinal fluid. EZ - ellipsoid zone. ERM - epiretinal membrane. ORA - outer retinal atrophy. ORT - outer retinal tubulation. SRHM - subretinal hyper-reflective material         Intravitreal Injection, Pharmacologic Agent - OS - Left Eye       Time Out 11/16/2017. 3:00 PM. Confirmed correct patient, procedure, site, and patient consented.   Anesthesia Topical anesthesia was used. Anesthetic medications included Tetracaine 0.5%, Lidocaine 2%.   Procedure Preparation included 5% betadine to ocular surface, eyelid speculum. A 30 gauge needle was used.   Injection: 2 mg aflibercept 2 MG/0.05ML   NDC: 61755-005-02    Lot: [id number]    Expiration Date: 09/16/2018   Route: Intravitreal   Site: Left Eye    Waste: 0.05 mL  Post-op Post injection exam found visual acuity of at least counting fingers. The patient tolerated the procedure well. There were no complications. The patient received written and verbal post procedure care education.                 ASSESSMENT/PLAN:    ICD-10-CM   1. Exudative age-related macular degeneration of left eye with active choroidal neovascularization (HCC) H35.3221 Intravitreal Injection, Pharmacologic Agent - OS - Left Eye    aflibercept (EYLEA) SOLN 2 mg  2. Intermediate stage nonexudative age-related macular degeneration of right eye H35.3112   3. Mild nonproliferative diabetic retinopathy of both eyes without macular edema associated with type 2 diabetes mellitus ([place]) S49.6759   4. Retinal edema H35.81 OCT, Retina - OU - Both Eyes  5. Posterior vitreous detachment of both eyes H43.813   6. Pseudophakia of both eyes Z96.1   7. Entropion of right eyelid H02.003     1. Exudative age related macular degeneration, OS    - The incidence pathology and anatomy of wet AMD discussed   - The ANCHOR, MARINA, CATT and VIEW trials discussed with patient.    - discussed treatment options including observation vs intravitreal anti-VEGF agents such as Avastin, Lucentis, Eylea.    - Risks of endophthalmitis and vascular occlusive events and atrophic changes discussed with patient  - S/P IVA OS #1 (03.05.19), #2 (04.03.19), #3 (05.01.19)  - S/P IVE OS #1 (05.29.19), #2 (06.26.19) -- Good Days approved  - BCVA stable today at 20/60  - OCT with interval improvement IRF/edema, and PED/SRHM  - recommend IVE OS #3 with extension of f/u to 6 wks  - pt wishes to be treated with IVE OS #3 (07.31.19)  - RBA of procedure discussed, questions answered  - informed consent obtained and signed  - see procedure note  - Eylea4U paperwork completed -- Good Days approved  -  f/u 6 weeks -- DFE/OCT/FA for full diabetic eval  2. Age related macular degeneration, non-exudative,  OD  - The incidence, anatomy, and pathology of dry AMD, risk of progression, and the AREDS and AREDS 2 study including smoking risks discussed with patient.  - stable  - continue amsler grid monitoring  3. Mild nonproliferative diabetic retinopathy w/o DME, both eyes - The incidence, risk factors for progression, natural history and treatment options for diabetic retinopathy were discussed with patient.   - The need for close monitoring of blood glucose, blood pressure, and serum lipids, avoiding cigarette or any type of tobacco, and the need for long term follow up was also discussed with patient. - exam with rare IRH  - will need FA for full diabetic eval -- recommend FA when #1 is stable  4. Retinal edema as described above  5. PVD / vitreous syneresis OU  Discussed findings and prognosis  No RT or RD on 360 peripheral exam  Reviewed s/s of RT/RD  Strict return precautions for any such RT/RD signs/symptoms  6. Pseudophakia OU  - s/p CE/IOL  - beautiful surgey, doing well  - monitor  7. Entropion RLL-  - s/p lower lid sx x 2 by Dr. Gershon Crane - now s/p repair with Dr. Kristeen Miss - doing well -management per Dr. Kristeen Miss  Ophthalmic Meds Ordered this visit:  Meds ordered this encounter  Medications  . aflibercept (EYLEA) SOLN 2 mg       Return in about 6 weeks (around 12/28/2017) for F/U Exu AMD OS, DFE, OCT.  There are no Patient Instructions on file for this visit.   Explained the diagnoses, plan, and follow up with the patient and they expressed understanding.  Patient expressed understanding of the importance of proper follow up care.   This document serves as a record of services personally performed by Gardiner Sleeper, MD, PhD. It was created on their behalf by Ernest Mallick, OA, an ophthalmic assistant. The creation of this record is the provider's dictation and/or activities during the visit.    Electronically signed by: Ernest Mallick, OA  07.29.2019 12:14 AM    Gardiner Sleeper, M.D., Ph.D. Diseases & Surgery of the Retina and Vitreous [facility]  I have reviewed the above documentation for accuracy and completeness, and I agree with the above. Gardiner Sleeper, M.D., Ph.D. 11/17/17 12:19 AM    Abbreviations: M myopia (nearsighted); A astigmatism; H hyperopia (farsighted); P presbyopia; Mrx spectacle prescription;  CTL contact lenses; OD right eye; OS left eye; OU both eyes  XT exotropia; ET esotropia; PEK punctate epithelial keratitis; PEE punctate epithelial erosions; DES dry eye syndrome; MGD meibomian gland dysfunction; ATs artificial tears; PFAT's preservative free artificial tears; [place] nuclear sclerotic cataract; PSC posterior subcapsular cataract; ERM epi-retinal membrane; PVD posterior vitreous detachment; RD retinal detachment; DM diabetes mellitus; DR diabetic retinopathy; NPDR non-proliferative diabetic retinopathy; PDR proliferative diabetic retinopathy; CSME clinically significant macular edema; DME diabetic macular edema; dbh dot blot hemorrhages; CWS cotton wool spot; POAG primary open angle glaucoma; C/D cup-to-disc ratio; HVF humphrey visual field; GVF goldmann visual field; OCT optical coherence tomography; IOP intraocular pressure; BRVO Branch retinal vein occlusion; CRVO central retinal vein occlusion; CRAO central retinal artery occlusion; BRAO branch retinal artery occlusion; RT retinal tear; SB scleral buckle; PPV pars plana vitrectomy; VH Vitreous hemorrhage; PRP panretinal laser photocoagulation; IVK intravitreal kenalog; VMT vitreomacular traction; MH Macular hole;  NVD neovascularization of the disc; NVE neovascularization elsewhere; AREDS age  related eye disease study; ARMD age related macular degeneration; POAG primary open angle glaucoma; EBMD epithelial/anterior basement membrane dystrophy; ACIOL anterior chamber intraocular lens; IOL intraocular lens; PCIOL posterior chamber intraocular lens; Phaco/IOL  phacoemulsification with intraocular lens placement; [place] photorefractive keratectomy; LASIK laser assisted in situ keratomileusis; HTN hypertension; DM diabetes mellitus; COPD chronic obstructive pulmonary disease

## 2017-11-16 ENCOUNTER — Encounter (INDEPENDENT_AMBULATORY_CARE_PROVIDER_SITE_OTHER): Payer: Self-pay | Admitting: Ophthalmology

## 2017-11-16 ENCOUNTER — Ambulatory Visit (INDEPENDENT_AMBULATORY_CARE_PROVIDER_SITE_OTHER): Payer: Medicare Other | Admitting: Ophthalmology

## 2017-11-16 DIAGNOSIS — Z961 Presence of intraocular lens: Secondary | ICD-10-CM

## 2017-11-16 DIAGNOSIS — H43813 Vitreous degeneration, bilateral: Secondary | ICD-10-CM | POA: Diagnosis not present

## 2017-11-16 DIAGNOSIS — H353112 Nonexudative age-related macular degeneration, right eye, intermediate dry stage: Secondary | ICD-10-CM

## 2017-11-16 DIAGNOSIS — H3581 Retinal edema: Secondary | ICD-10-CM | POA: Diagnosis not present

## 2017-11-16 DIAGNOSIS — H353221 Exudative age-related macular degeneration, left eye, with active choroidal neovascularization: Secondary | ICD-10-CM

## 2017-11-16 DIAGNOSIS — E113293 Type 2 diabetes mellitus with mild nonproliferative diabetic retinopathy without macular edema, bilateral: Secondary | ICD-10-CM | POA: Diagnosis not present

## 2017-11-16 DIAGNOSIS — H02003 Unspecified entropion of right eye, unspecified eyelid: Secondary | ICD-10-CM

## 2017-11-17 ENCOUNTER — Encounter (INDEPENDENT_AMBULATORY_CARE_PROVIDER_SITE_OTHER): Payer: Self-pay | Admitting: Ophthalmology

## 2017-11-17 DIAGNOSIS — H3581 Retinal edema: Secondary | ICD-10-CM | POA: Diagnosis not present

## 2017-11-17 DIAGNOSIS — H353221 Exudative age-related macular degeneration, left eye, with active choroidal neovascularization: Secondary | ICD-10-CM | POA: Diagnosis not present

## 2017-11-17 DIAGNOSIS — H43813 Vitreous degeneration, bilateral: Secondary | ICD-10-CM | POA: Diagnosis not present

## 2017-11-17 MED ORDER — AFLIBERCEPT 2MG/0.05ML IZ SOLN FOR KALEIDOSCOPE
2.0000 mg | INTRAVITREAL | Status: DC
  Administered 2017-11-17: 2 mg via INTRAVITREAL

## 2017-12-27 NOTE — Progress Notes (Signed)
[facility] Note  12/28/2017     CHIEF COMPLAINT Patient presents for Retina Follow Up   HISTORY OF PRESENT ILLNESS: [name] is a 84 y.o. female who presents to the clinic today for:   HPI    Retina Follow Up    Patient presents with  Wet AMD.  In left eye.  This started 6 months ago.  Severity is mild.  Since onset it is gradually improving.  I, the attending physician,  performed the HPI with the patient and updated documentation appropriately.          Comments    F/U EXU AMD OS. Patient states she does not have floaters any longer(x 1 week), vision has "gotten better,but still can not read good". Patient is ready for tx today if indicated.       Last edited by [name], MD on 12/28/2017  2:33 PM. (History)      Referring physician: [name], MD [address] [place], [place] [address]  HISTORICAL INFORMATION:   Selected notes from the MEDICAL RECORD NUMBER Referred by Dr. [name] for concern of exudative AMD LEE- 02.27.19 (W. Turner) [BCVA OD: 20/40 OS: 20/70-] Ocular Hx- pseudophakia OU, lid sx (Shapiro x 24 yrs ago) PMH- DM, HTN    CURRENT MEDICATIONS: No current outpatient medications on file. (Ophthalmic Drugs)   Current Facility-Administered Medications (Ophthalmic Drugs)  Medication Route  . aflibercept (EYLEA) SOLN 2 mg Intravitreal  . aflibercept (EYLEA) SOLN 2 mg Intravitreal  . aflibercept (EYLEA) SOLN 2 mg Intravitreal  . aflibercept (EYLEA) SOLN 2 mg Intravitreal   Current Outpatient Medications (Other)  Medication Sig  . acetaminophen (TYLENOL) 500 MG tablet Take 500 mg by mouth every 6 (six) hours as needed. For arthritis pain  . insulin glargine (LANTUS) 100 UNIT/ML injection Inject 35 Units into the skin daily.   . insulin glargine (LANTUS) 100 UNIT/ML injection   . losartan-hydrochlorothiazide (HYZAAR) 100-25 MG per tablet Take 1 tablet by mouth daily.  . metFORMIN (GLUCOPHAGE) 500 MG tablet  Take 500 mg by mouth daily.  . metFORMIN (GLUCOPHAGE-XR) 500 MG 24 hr tablet   . olmesartan (BENICAR) 20 MG tablet Take 20 mg by mouth daily.  Marland Kitchen sulfamethoxazole-trimethoprim (BACTRIM DS,SEPTRA DS) 800-160 MG tablet Take 1 tablet by mouth 2 (two) times daily. Take 1 bid   Current Facility-Administered Medications (Other)  Medication Route  . Bevacizumab (AVASTIN) SOLN 1.25 mg Intravitreal  . Bevacizumab (AVASTIN) SOLN 1.25 mg Intravitreal  . Bevacizumab (AVASTIN) SOLN 1.25 mg Intravitreal      REVIEW OF SYSTEMS: ROS    Positive for: Eyes   Negative for: Constitutional, Gastrointestinal, Neurological, Skin, Genitourinary, Musculoskeletal, HENT, Endocrine, Cardiovascular, Respiratory, Psychiatric, Allergic/Imm, Heme/Lymph   Last edited by Zenovia Jordan, LPN on 09/21/5407  8:11 PM. (History)       ALLERGIES No Known Allergies  PAST MEDICAL HISTORY Past Medical History:  Diagnosis Date  . Arthritis   . Back pain 01/30/2015  . Diabetes mellitus without complication ([place])   . Hematuria 12/25/2014  . Hypertension   . PONV (postoperative nausea and vomiting)   . Urinary urgency 12/25/2014  . UTI (lower urinary tract infection) 11/02/2013   Past Surgical History:  Procedure Laterality Date  . ABDOMINAL HYSTERECTOMY  1960s   with bladder tack up  . BREAST SURGERY     benign, right  . C-EYE SURGERY PROCEDURE    . CATARACT EXTRACTION W/PHACO  02/08/2012   Procedure: CATARACT  EXTRACTION PHACO AND INTRAOCULAR LENS PLACEMENT (IOC);  Surgeon: Elta Guadeloupe T. Gershon Crane, MD;  Location: [facility];  Service: Ophthalmology;  Laterality: Left;  CDE:11.62  . EYE SURGERY    . RECTOCELE REPAIR  1985   [facility], [place]    FAMILY HISTORY Family History  Problem Relation Age of Onset  . Diabetes Mother   . Diabetes Brother   . Diabetes Brother   . Heart attack Brother   . Cancer Brother        lung  . Cancer Brother        lung  . Birth defects Brother        passed away at age 17; hole in heart  .  Emphysema Brother     SOCIAL HISTORY Social History   Tobacco Use  . Smoking status: Never Smoker  . Smokeless tobacco: Never Used  Substance Use Topics  . Alcohol use: No  . Drug use: No         OPHTHALMIC EXAM:  Base Eye Exam    Visual Acuity (Snellen - Linear)      Right Left   Dist [place] 20/60 +1 20/70 +2   Dist ph [facility] [facility] 20/60 +2 20/70       Tonometry (Tonopen, 2:13 PM)      Right Left   Pressure 15 17       Pupils      Dark Light Shape React APD   Right 4 3 Round Brisk None   Left 4 3 Round Brisk None       Visual Fields (Counting fingers)      Left Right    Full Full       Extraocular Movement      Right Left    Full, Ortho Full, Ortho       Neuro/Psych    Oriented x3:  Yes   Mood/Affect:  Normal       Dilation    Both eyes:  1.0% Mydriacyl, 2.5% Phenylephrine @ 2:13 PM        Slit Lamp and Fundus Exam    Slit Lamp Exam      Right Left   Lids/Lashes Dermatochalasis - upper lid, Telangiectasia, Ptosis Dermatochalasis - upper lid, Telangiectasia, Meibomian gland dysfunction   Conjunctiva/Sclera Mild Injection White and quiet   Cornea Arcus, Inferior 2+ Punctate epithelial erosions Arcus, Inferior 3+ Punctate epithelial erosions   Anterior Chamber Deep and quiet Deep and quiet   Iris Patent peripheral iridectomy at 0900, 360 peripapillary Transillumination defects at 0430 and 0800 to 0900 Round and dilated   Lens PC IOL in good position, 1+ Posterior capsular opacification PC IOL in good position, mild PC fold, 1+ non-central Posterior capsular opacification   Vitreous Posterior vitreous detachment Posterior vitreous detachment       Fundus Exam      Right Left   Disc Temporal Peripapillary atrophy and pigmenation, Pink and Sharp, mild Pallor 360 Peripapillary atrophy, Pallor   C/D Ratio 0.2 0.4   Macula Drusen, RPE mottling and clumping, Blunted foveal reflex, RPE atrophy nasal to fovea, No heme or edema peripapillary hemorrhage - remains  resolved, +CNVM with no edema, Blunted foveal reflex, Drusen, RPE atrophy, mottling and clumping   Vessels Vascular attenuation, AV crossing changes Vascular attenuation   Periphery Attached, mild peripheral drusen, Reticular degeneration Attached          IMAGING AND PROCEDURES  Imaging and Procedures for 08/17/17  OCT, Retina - OU - Both Eyes  Right Eye Quality was good. Central Foveal Thickness: 188. Progression has been stable. Findings include normal foveal contour, no IRF, no SRF, epiretinal membrane, outer retinal atrophy.   Left Eye Quality was good. Central Foveal Thickness: 229. Progression has been stable. Findings include retinal drusen , subretinal hyper-reflective material, normal foveal contour, no IRF, no SRF, pigment epithelial detachment, outer retinal atrophy (interval improvement in SRHM/PED).   Notes *Images captured and stored on drive  Diagnosis / Impression:  Non-Exudative ARMD OD Exudative ARMD OS with interval improvement in [facility] / PED; significant ORA -- fluid remains resolved  Clinical management:  See below  Abbreviations: NFP - Normal foveal profile. CME - cystoid macular edema. PED - pigment epithelial detachment. IRF - intraretinal fluid. SRF - subretinal fluid. EZ - ellipsoid zone. ERM - epiretinal membrane. ORA - outer retinal atrophy. ORT - outer retinal tubulation. SRHM - subretinal hyper-reflective material         Intravitreal Injection, Pharmacologic Agent - OS - Left Eye       Time Out 12/28/2017. 3:10 PM. Confirmed correct patient, procedure, site, and patient consented.   Anesthesia Topical anesthesia was used. Anesthetic medications included Lidocaine 2%, Tetracaine 0.5%.   Procedure Preparation included eyelid speculum, 5% betadine to ocular surface. A 30 gauge needle was used.   Injection:  2 mg aflibercept 2 MG/0.05ML   NDC: 61755-005-02, Lot: [id number], Expiration date: 11/16/2018   Route: Intravitreal, Site: Left  Eye, Waste: 0.05 mL  Post-op Post injection exam found visual acuity of at least counting fingers. The patient tolerated the procedure well. There were no complications. The patient received written and verbal post procedure care education.                 ASSESSMENT/PLAN:    ICD-10-CM   1. Exudative age-related macular degeneration of left eye with active choroidal neovascularization (HCC) H35.3221 OCT, Retina - OU - Both Eyes    Intravitreal Injection, Pharmacologic Agent - OS - Left Eye    aflibercept (EYLEA) SOLN 2 mg  2. Intermediate stage nonexudative age-related macular degeneration of right eye H35.3112   3. Mild nonproliferative diabetic retinopathy of both eyes without macular edema associated with type 2 diabetes mellitus ([place]) U98.1191   4. Retinal edema H35.81 OCT, Retina - OU - Both Eyes  5. Posterior vitreous detachment of both eyes H43.813   6. Pseudophakia of both eyes Z96.1   7. Entropion of right eyelid H02.003     1. Exudative age related macular degeneration, OS    - S/P IVA OS #1 (03.05.19), #2 (04.03.19), #3 (05.01.19) -- switched to Eylea due to poor response  - S/P IVE OS #1 (05.29.19), #2 (06.26.19)-- Good Days approved, #3 (07.31.19) -- 6 wk interval  - BCVA relatively stable today at 20/70  - OCT with stable improvement in IRF/edema, PED/SRHM stable  - recommend IVE OS #3 with extension of f/u to 8 wks  - pt wishes to be treated with IVE OS #4 (09.11.19)  - RBA of procedure discussed, questions answered  - informed consent obtained and signed  - see procedure note  - Eylea4U paperwork completed -- Good Days approved  - f/u 8 weeks -- DFE/OCT/FA (Optos, transit OS) for full diabetic eval  2. Age related macular degeneration, non-exudative, OD  - The incidence, anatomy, and pathology of dry AMD, risk of progression, and the AREDS and AREDS 2 study including smoking risks discussed with patient.  - stable  - continue amsler grid monitoring  3.  Mild nonproliferative diabetic retinopathy w/o DME, both eyes - The incidence, risk factors for progression, natural history and treatment options for diabetic retinopathy were discussed with patient.   - The need for close monitoring of blood glucose, blood pressure, and serum lipids, avoiding cigarette or any type of tobacco, and the need for long term follow up was also discussed with patient. - exam with rare IRH  - will need FA for full diabetic eval -- recommend FA when #1 is stable  4. Retinal edema as described above  5. PVD / vitreous syneresis OU  Discussed findings and prognosis  No RT or RD on 360 peripheral exam  Reviewed s/s of RT/RD  Strict return precautions for any such RT/RD signs/symptoms  6. Pseudophakia OU  - s/p CE/IOL  - beautiful surgey, doing well  - monitor  7. Entropion RLL-  - s/p lower lid sx x 2 by Dr. Gershon Crane - now s/p repair with Dr. Kristeen Miss - doing well - management per Dr. Kristeen Miss  Ophthalmic Meds Ordered this visit:  Meds ordered this encounter  Medications  . aflibercept (EYLEA) SOLN 2 mg       Return in about 8 weeks (around 02/22/2018) for F/U Exu AMD OS, DFE, OCT, FA.  There are no Patient Instructions on file for this visit.   Explained the diagnoses, plan, and follow up with the patient and they expressed understanding.  Patient expressed understanding of the importance of proper follow up care.   This document serves as a record of services personally performed by Gardiner Sleeper, MD, PhD. It was created on their behalf by Ernest Mallick, OA, an ophthalmic assistant. The creation of this record is the provider's dictation and/or activities during the visit.    Electronically signed by: Ernest Mallick, OA  09.10.19 3:32 PM   This document serves as a record of services personally performed by Gardiner Sleeper, MD, PhD. It was created on their behalf by Catha Brow, [place], a certified ophthalmic assistant. The creation of this record is the  provider's dictation and/or activities during the visit.  Electronically signed by: Catha Brow, [place]  09.11.19 3:32 PM    Gardiner Sleeper, M.D., Ph.D. Diseases & Surgery of the Retina and Vitreous [facility]  I have reviewed the above documentation for accuracy and completeness, and I agree with the above. Gardiner Sleeper, M.D., Ph.D. 12/28/17 3:32 PM    Abbreviations: M myopia (nearsighted); A astigmatism; H hyperopia (farsighted); P presbyopia; Mrx spectacle prescription;  CTL contact lenses; OD right eye; OS left eye; OU both eyes  XT exotropia; ET esotropia; PEK punctate epithelial keratitis; PEE punctate epithelial erosions; DES dry eye syndrome; MGD meibomian gland dysfunction; ATs artificial tears; PFAT's preservative free artificial tears; [place] nuclear sclerotic cataract; PSC posterior subcapsular cataract; ERM epi-retinal membrane; PVD posterior vitreous detachment; RD retinal detachment; DM diabetes mellitus; DR diabetic retinopathy; NPDR non-proliferative diabetic retinopathy; PDR proliferative diabetic retinopathy; CSME clinically significant macular edema; DME diabetic macular edema; dbh dot blot hemorrhages; CWS cotton wool spot; POAG primary open angle glaucoma; C/D cup-to-disc ratio; HVF humphrey visual field; GVF goldmann visual field; OCT optical coherence tomography; IOP intraocular pressure; BRVO Branch retinal vein occlusion; CRVO central retinal vein occlusion; CRAO central retinal artery occlusion; BRAO branch retinal artery occlusion; RT retinal tear; SB scleral buckle; PPV pars plana vitrectomy; VH Vitreous hemorrhage; PRP panretinal laser photocoagulation; IVK intravitreal kenalog; VMT vitreomacular traction; MH Macular hole;  NVD neovascularization of the disc; NVE  neovascularization elsewhere; AREDS age related eye disease study; ARMD age related macular degeneration; POAG primary open angle glaucoma; EBMD epithelial/anterior basement membrane  dystrophy; ACIOL anterior chamber intraocular lens; IOL intraocular lens; PCIOL posterior chamber intraocular lens; Phaco/IOL phacoemulsification with intraocular lens placement; [place] photorefractive keratectomy; LASIK laser assisted in situ keratomileusis; HTN hypertension; DM diabetes mellitus; COPD chronic obstructive pulmonary disease

## 2017-12-28 ENCOUNTER — Encounter (INDEPENDENT_AMBULATORY_CARE_PROVIDER_SITE_OTHER): Payer: Self-pay | Admitting: Ophthalmology

## 2017-12-28 ENCOUNTER — Ambulatory Visit (INDEPENDENT_AMBULATORY_CARE_PROVIDER_SITE_OTHER): Payer: Medicare Other | Admitting: Ophthalmology

## 2017-12-28 DIAGNOSIS — H3581 Retinal edema: Secondary | ICD-10-CM

## 2017-12-28 DIAGNOSIS — H353221 Exudative age-related macular degeneration, left eye, with active choroidal neovascularization: Secondary | ICD-10-CM | POA: Diagnosis not present

## 2017-12-28 DIAGNOSIS — H353112 Nonexudative age-related macular degeneration, right eye, intermediate dry stage: Secondary | ICD-10-CM

## 2017-12-28 DIAGNOSIS — H43813 Vitreous degeneration, bilateral: Secondary | ICD-10-CM

## 2017-12-28 DIAGNOSIS — E113293 Type 2 diabetes mellitus with mild nonproliferative diabetic retinopathy without macular edema, bilateral: Secondary | ICD-10-CM

## 2017-12-28 DIAGNOSIS — H02003 Unspecified entropion of right eye, unspecified eyelid: Secondary | ICD-10-CM

## 2017-12-28 DIAGNOSIS — Z961 Presence of intraocular lens: Secondary | ICD-10-CM

## 2017-12-28 MED ORDER — AFLIBERCEPT 2MG/0.05ML IZ SOLN FOR KALEIDOSCOPE
2.0000 mg | INTRAVITREAL | Status: DC
  Administered 2017-12-28: 2 mg via INTRAVITREAL

## 2018-01-05 DIAGNOSIS — Z79899 Other long term (current) drug therapy: Secondary | ICD-10-CM | POA: Diagnosis not present

## 2018-01-05 DIAGNOSIS — N183 Chronic kidney disease, stage 3 (moderate): Secondary | ICD-10-CM | POA: Diagnosis not present

## 2018-01-05 DIAGNOSIS — E1139 Type 2 diabetes mellitus with other diabetic ophthalmic complication: Secondary | ICD-10-CM | POA: Diagnosis not present

## 2018-01-05 DIAGNOSIS — I10 Essential (primary) hypertension: Secondary | ICD-10-CM | POA: Diagnosis not present

## 2018-01-12 DIAGNOSIS — E1140 Type 2 diabetes mellitus with diabetic neuropathy, unspecified: Secondary | ICD-10-CM | POA: Diagnosis not present

## 2018-01-12 DIAGNOSIS — N183 Chronic kidney disease, stage 3 (moderate): Secondary | ICD-10-CM | POA: Diagnosis not present

## 2018-01-12 DIAGNOSIS — I129 Hypertensive chronic kidney disease with stage 1 through stage 4 chronic kidney disease, or unspecified chronic kidney disease: Secondary | ICD-10-CM | POA: Diagnosis not present

## 2018-01-12 DIAGNOSIS — Z23 Encounter for immunization: Secondary | ICD-10-CM | POA: Diagnosis not present

## 2018-01-23 ENCOUNTER — Telehealth: Payer: Self-pay | Admitting: Adult Health

## 2018-01-23 ENCOUNTER — Other Ambulatory Visit (INDEPENDENT_AMBULATORY_CARE_PROVIDER_SITE_OTHER): Payer: Medicare Other

## 2018-01-23 DIAGNOSIS — R350 Frequency of micturition: Secondary | ICD-10-CM

## 2018-01-23 LAB — POCT URINALYSIS DIPSTICK OB: KETONES UA: NEGATIVE

## 2018-01-23 MED ORDER — SULFAMETHOXAZOLE-TRIMETHOPRIM 800-160 MG PO TABS: 1.0000 | ORAL_TABLET | Freq: Two times a day (BID) | ORAL | 0 refills | Status: DC

## 2018-01-23 NOTE — Telephone Encounter (Signed)
Left  message that Rx sent to [facility] and urine culture sent

## 2018-01-25 ENCOUNTER — Telehealth: Payer: Self-pay | Admitting: Adult Health

## 2018-01-25 LAB — URINE CULTURE

## 2018-01-25 NOTE — Telephone Encounter (Signed)
Pt aware +Ecoli, that that septra ds sound take care of I  and she says she is feeling better.

## 2018-01-26 DIAGNOSIS — L298 Other pruritus: Secondary | ICD-10-CM | POA: Diagnosis not present

## 2018-01-26 DIAGNOSIS — T148XXA Other injury of unspecified body region, initial encounter: Secondary | ICD-10-CM | POA: Diagnosis not present

## 2018-01-26 DIAGNOSIS — I872 Venous insufficiency (chronic) (peripheral): Secondary | ICD-10-CM | POA: Diagnosis not present

## 2018-02-22 ENCOUNTER — Encounter (INDEPENDENT_AMBULATORY_CARE_PROVIDER_SITE_OTHER): Payer: Medicare Other | Admitting: Ophthalmology

## 2018-02-24 ENCOUNTER — Encounter (INDEPENDENT_AMBULATORY_CARE_PROVIDER_SITE_OTHER): Payer: Medicare Other | Admitting: Ophthalmology

## 2018-03-01 NOTE — Progress Notes (Signed)
[facility] Note  03/02/2018     CHIEF COMPLAINT Patient presents for Retina Follow Up   HISTORY OF PRESENT ILLNESS: [name] is a 84 y.o. female who presents to the clinic today for:   HPI    Retina Follow Up    Patient presents with  Wet AMD.  In left eye.  Severity is moderate.  Duration of 8 weeks.  Since onset it is gradually improving.  I, the attending physician,  performed the HPI with the patient and updated documentation appropriately.          Comments    Patient states vision improved some OS. Doesn't see as many floaters OS. BS was 159 yesterday am. Last A1c was 7.4 on 01-28-2018. No eye pain/discomfort.        Last edited by [name], MD on 03/02/2018  8:38 AM. (History)    pt states that her floaters are gone OS and she is able to see out of it better now, pt is using Soothe drops twice a day  Referring physician: [name], MD [address] [place], [place] [address]  HISTORICAL INFORMATION:   Selected notes from the MEDICAL RECORD NUMBER Referred by Dr. [name] for concern of exudative AMD LEE- 02.27.19 (W. [facility]) [BCVA OD: 20/40 OS: 20/70-] Ocular Hx- pseudophakia OU, lid sx (Shapiro x 24 yrs ago) PMH- DM, HTN    CURRENT MEDICATIONS: No current outpatient medications on file. (Ophthalmic Drugs)   Current Facility-Administered Medications (Ophthalmic Drugs)  Medication Route  . aflibercept (EYLEA) SOLN 2 mg Intravitreal  . aflibercept (EYLEA) SOLN 2 mg Intravitreal  . aflibercept (EYLEA) SOLN 2 mg Intravitreal  . aflibercept (EYLEA) SOLN 2 mg Intravitreal  . aflibercept (EYLEA) SOLN 2 mg Intravitreal   Current Outpatient Medications (Other)  Medication Sig  . acetaminophen (TYLENOL) 500 MG tablet Take 500 mg by mouth every 6 (six) hours as needed. For arthritis pain  . insulin glargine (LANTUS) 100 UNIT/ML injection Inject 35 Units into the skin daily.   . insulin glargine (LANTUS) 100 UNIT/ML  injection   . losartan-hydrochlorothiazide (HYZAAR) 100-25 MG per tablet Take 1 tablet by mouth daily.  . metFORMIN (GLUCOPHAGE) 500 MG tablet Take 500 mg by mouth daily.  . metFORMIN (GLUCOPHAGE-XR) 500 MG 24 hr tablet   . olmesartan (BENICAR) 20 MG tablet Take 20 mg by mouth daily.  Marland Kitchen sulfamethoxazole-trimethoprim (BACTRIM DS,SEPTRA DS) 800-160 MG tablet Take 1 tablet by mouth 2 (two) times daily. Take 1 bid (Patient not taking: Reported on 03/02/2018)   Current Facility-Administered Medications (Other)  Medication Route  . Bevacizumab (AVASTIN) SOLN 1.25 mg Intravitreal  . Bevacizumab (AVASTIN) SOLN 1.25 mg Intravitreal  . Bevacizumab (AVASTIN) SOLN 1.25 mg Intravitreal      REVIEW OF SYSTEMS: ROS    Positive for: Musculoskeletal, Endocrine, Eyes   Negative for: Constitutional, Gastrointestinal, Neurological, Skin, Genitourinary, HENT, Cardiovascular, Respiratory, Psychiatric, Allergic/Imm, Heme/Lymph   Last edited by Roselee Nova D on 03/02/2018  7:46 AM. (History)       ALLERGIES No Known Allergies  PAST MEDICAL HISTORY Past Medical History:  Diagnosis Date  . Arthritis   . Back pain 01/30/2015  . Diabetes mellitus without complication ([place])   . Hematuria 12/25/2014  . Hypertension   . PONV (postoperative nausea and vomiting)   . Urinary urgency 12/25/2014  . UTI (lower urinary tract infection) 11/02/2013   Past Surgical History:  Procedure Laterality Date  . ABDOMINAL HYSTERECTOMY  1960s  with bladder tack up  . BREAST SURGERY     benign, right  . C-EYE SURGERY PROCEDURE    . CATARACT EXTRACTION W/PHACO  02/08/2012   Procedure: CATARACT EXTRACTION PHACO AND INTRAOCULAR LENS PLACEMENT (IOC);  Surgeon: Elta Guadeloupe T. Gershon Crane, MD;  Location: [facility];  Service: Ophthalmology;  Laterality: Left;  CDE:11.62  . EYE SURGERY    . RECTOCELE REPAIR  1985   [facility], [place]    FAMILY HISTORY Family History  Problem Relation Age of Onset  . Diabetes Mother   . Diabetes Brother    . Diabetes Brother   . Heart attack Brother   . Cancer Brother        lung  . Cancer Brother        lung  . Birth defects Brother        passed away at age 80; hole in heart  . Emphysema Brother     SOCIAL HISTORY Social History   Tobacco Use  . Smoking status: Never Smoker  . Smokeless tobacco: Never Used  Substance Use Topics  . Alcohol use: No  . Drug use: No         OPHTHALMIC EXAM:  Base Eye Exam    Visual Acuity (Snellen - Linear)      Right Left   Dist cc 20/70 -1 20/60 -1   Dist ph [place] 20/50 +1 20/50   Correction:  Glasses  Didn't bring glasses. Plugged RX into phoropter.        Tonometry (Tonopen, 8:02 AM)      Right Left   Pressure 18 17       Pupils      Dark Light Shape React APD   Right 4 3 Round Brisk None   Left 4 3 Round Brisk None       Visual Fields (Counting fingers)      Left Right    Full Full       Extraocular Movement      Right Left    Full, Ortho Full, Ortho       Neuro/Psych    Oriented x3:  Yes   Mood/Affect:  Normal       Dilation    Both eyes:  1.0% Mydriacyl, 2.5% Phenylephrine @ 8:02 AM        Slit Lamp and Fundus Exam    Slit Lamp Exam      Right Left   Lids/Lashes Dermatochalasis - upper lid, Telangiectasia, Ptosis Dermatochalasis - upper lid, Telangiectasia, Meibomian gland dysfunction   Conjunctiva/Sclera White and quiet White and quiet   Cornea Arcus, Inferior 2-3 inferior+ Punctate epithelial erosions Arcus, Inferior 1+ Punctate epithelial erosions   Anterior Chamber Deep and quiet Deep and quiet   Iris Patent peripheral iridectomy at 0900, 360 peripapillary Transillumination defects at 0430 and 0800 to 0900 Round and dilated   Lens PC IOL in good position, 1+ Posterior capsular opacification PC IOL in good position, mild PC fold, 1+ non-central Posterior capsular opacification   Vitreous Posterior vitreous detachment Posterior vitreous detachment, Vitreous syneresis       Fundus Exam      Right  Left   Disc Temporal Peripapillary atrophy and pigmenation, Pink and Sharp, mild Pallor 360 Peripapillary atrophy, mild Pallor   C/D Ratio 0.4 0.4   Macula Flat, Blunted foveal reflex, RPE mottling and clumping, Drusen, RPE atrophy nasal to fovea, No heme or edema +CNVM with no edema, Blunted foveal reflex, Drusen, RPE atrophy, mottling and clumping  Vessels Vascular attenuation, AV crossing changes Vascular attenuation   Periphery Attached, mild peripheral drusen, Reticular degeneration Attached, mild peripheral drusen, mild reticular degeneration        Refraction    Wearing Rx      Sphere Cylinder Axis Add   Right -1.00 +0.75 012 +2.75   Left -0.25 +1.00 013 +2.50       Manifest Refraction (Over)      Sphere Cylinder Axis Dist VA   Right -0.50 +1.25 015 20/50   Left +0.25 +0.50 020 20/50+1          IMAGING AND PROCEDURES  Imaging and Procedures for 08/17/17  OCT, Retina - OU - Both Eyes       Right Eye Quality was good. Central Foveal Thickness: 187. Progression has been stable. Findings include normal foveal contour, no IRF, no SRF, epiretinal membrane, outer retinal atrophy (Thin choroid).   Left Eye Quality was good. Central Foveal Thickness: 218. Progression has been stable. Findings include retinal drusen , subretinal hyper-reflective material, normal foveal contour, no IRF, no SRF, pigment epithelial detachment, outer retinal atrophy (interval improvement in SRHM/PED -- no overlying fluid, thin choroid).   Notes *Images captured and stored on drive  Diagnosis / Impression:  No DME OU Non-Exudative ARMD OD Exudative ARMD OS with interval improvement in [facility] / PED; significant ORA -- fluid remains resolved  Clinical management:  See below  Abbreviations: NFP - Normal foveal profile. CME - cystoid macular edema. PED - pigment epithelial detachment. IRF - intraretinal fluid. SRF - subretinal fluid. EZ - ellipsoid zone. ERM - epiretinal membrane. ORA - outer  retinal atrophy. ORT - outer retinal tubulation. SRHM - subretinal hyper-reflective material         Intravitreal Injection, Pharmacologic Agent - OS - Left Eye       Time Out 03/02/2018. 8:13 AM. Confirmed correct patient, procedure, site, and patient consented.   Anesthesia Topical anesthesia was used. Anesthetic medications included Lidocaine 2%, Proparacaine 0.5%.   Procedure Preparation included 5% betadine to ocular surface, eyelid speculum. A 30 gauge needle was used.   Injection:  2 mg aflibercept 2 MG/0.05ML   NDC: 61755-005-02, Lot: [id number], Expiration date: 01/17/2019   Route: Intravitreal, Site: Left Eye, Waste: 0.05 mL  Post-op Post injection exam found visual acuity of at least counting fingers. The patient tolerated the procedure well. There were no complications. The patient received written and verbal post procedure care education.                 ASSESSMENT/PLAN:    ICD-10-CM   1. Exudative age-related macular degeneration of left eye with active choroidal neovascularization (HCC) H35.3221 Intravitreal Injection, Pharmacologic Agent - OS - Left Eye    aflibercept (EYLEA) SOLN 2 mg  2. Intermediate stage nonexudative age-related macular degeneration of right eye H35.3112   3. Mild nonproliferative diabetic retinopathy of both eyes without macular edema associated with type 2 diabetes mellitus ([place]) Q73.4193   4. Retinal edema H35.81 OCT, Retina - OU - Both Eyes  5. Posterior vitreous detachment of both eyes H43.813   6. Pseudophakia of both eyes Z96.1   7. Entropion of right eyelid H02.003     1. Exudative age related macular degeneration, OS    - S/P IVA OS #1 (03.05.19), #2 (04.03.19), #3 (05.01.19) -- switched to Eylea due to poor response  - Good Days approved  - S/P IVE OS #1 (05.29.19), #2 (06.26.19), #3 (07.31.19), #4 (09.11.19)  - BCVA slightly improved  today from 20/70 to 20/50  - OCT with improved PED/SRHM, no overlying IRF/SRF  -  recommend IVE #5 today, with extension to 10 weeks  - pt wishes to be treated with IVE OS #5 (11.14.19)  - RBA of procedure discussed, questions answered  - informed consent obtained and signed  - see procedure note  - Eylea4U paperwork completed -- Good Days approved  - f/u 10 weeks -- DFE/OCT   2. Age related macular degeneration, non-exudative, OD  - The incidence, anatomy, and pathology of dry AMD, risk of progression, and the AREDS and AREDS 2 study including smoking risks discussed with patient.  - stable  - continue amsler grid monitoring  3. Mild nonproliferative diabetic retinopathy w/o DME, both eyes - The incidence, risk factors for progression, natural history and treatment options for diabetic retinopathy were discussed with patient.   - The need for close monitoring of blood glucose, blood pressure, and serum lipids, avoiding cigarette or any type of tobacco, and the need for long term follow up was also discussed with patient. - exam with rare IRH  - pt unable to cooperate with fluorescencein angiogram  4. Retinal edema as described above  5. PVD / vitreous syneresis OU  Discussed findings and prognosis  No RT or RD on 360 peripheral exam  Reviewed s/s of RT/RD  Strict return precautions for any such RT/RD signs/symptoms  6. Pseudophakia OU  - s/p CE/IOL  - beautiful surgey, doing well  - monitor  7. Entropion RLL-  - s/p lower lid sx x 2 by Dr. Gershon Crane - now s/p repair with Dr. Kristeen Miss - doing well - management per Dr. Kristeen Miss  Ophthalmic Meds Ordered this visit:  Meds ordered this encounter  Medications  . aflibercept (EYLEA) SOLN 2 mg       Return in about 10 weeks (around 05/11/2018) for F/U exu ARMD OS, OCT, DFE.  There are no Patient Instructions on file for this visit.   Explained the diagnoses, plan, and follow up with the patient and they expressed understanding.  Patient expressed understanding of the importance of proper follow up care.   This  document serves as a record of services personally performed by Gardiner Sleeper, MD, PhD. It was created on their behalf by Ernest Mallick, OA, an ophthalmic assistant. The creation of this record is the provider's dictation and/or activities during the visit.    Electronically signed by: Ernest Mallick, OA  11.13.19 9:06 AM    Gardiner Sleeper, M.D., Ph.D. Diseases & Surgery of the Retina and Vitreous [facility]   I have reviewed the above documentation for accuracy and completeness, and I agree with the above. Gardiner Sleeper, M.D., Ph.D. 03/02/18 9:08 AM     Abbreviations: M myopia (nearsighted); A astigmatism; H hyperopia (farsighted); P presbyopia; Mrx spectacle prescription;  CTL contact lenses; OD right eye; OS left eye; OU both eyes  XT exotropia; ET esotropia; PEK punctate epithelial keratitis; PEE punctate epithelial erosions; DES dry eye syndrome; MGD meibomian gland dysfunction; ATs artificial tears; PFAT's preservative free artificial tears; [place] nuclear sclerotic cataract; PSC posterior subcapsular cataract; ERM epi-retinal membrane; PVD posterior vitreous detachment; RD retinal detachment; DM diabetes mellitus; DR diabetic retinopathy; NPDR non-proliferative diabetic retinopathy; PDR proliferative diabetic retinopathy; CSME clinically significant macular edema; DME diabetic macular edema; dbh dot blot hemorrhages; CWS cotton wool spot; POAG primary open angle glaucoma; C/D cup-to-disc ratio; HVF humphrey visual field; GVF goldmann visual field; OCT optical coherence  tomography; IOP intraocular pressure; BRVO Branch retinal vein occlusion; CRVO central retinal vein occlusion; CRAO central retinal artery occlusion; BRAO branch retinal artery occlusion; RT retinal tear; SB scleral buckle; PPV pars plana vitrectomy; VH Vitreous hemorrhage; PRP panretinal laser photocoagulation; IVK intravitreal kenalog; VMT vitreomacular traction; MH Macular hole;  NVD neovascularization  of the disc; NVE neovascularization elsewhere; AREDS age related eye disease study; ARMD age related macular degeneration; POAG primary open angle glaucoma; EBMD epithelial/anterior basement membrane dystrophy; ACIOL anterior chamber intraocular lens; IOL intraocular lens; PCIOL posterior chamber intraocular lens; Phaco/IOL phacoemulsification with intraocular lens placement; [place] photorefractive keratectomy; LASIK laser assisted in situ keratomileusis; HTN hypertension; DM diabetes mellitus; COPD chronic obstructive pulmonary disease

## 2018-03-02 ENCOUNTER — Encounter (INDEPENDENT_AMBULATORY_CARE_PROVIDER_SITE_OTHER): Payer: Self-pay | Admitting: Ophthalmology

## 2018-03-02 ENCOUNTER — Ambulatory Visit (INDEPENDENT_AMBULATORY_CARE_PROVIDER_SITE_OTHER): Payer: Medicare Other | Admitting: Ophthalmology

## 2018-03-02 DIAGNOSIS — E113293 Type 2 diabetes mellitus with mild nonproliferative diabetic retinopathy without macular edema, bilateral: Secondary | ICD-10-CM | POA: Diagnosis not present

## 2018-03-02 DIAGNOSIS — H353221 Exudative age-related macular degeneration, left eye, with active choroidal neovascularization: Secondary | ICD-10-CM | POA: Diagnosis not present

## 2018-03-02 DIAGNOSIS — H353112 Nonexudative age-related macular degeneration, right eye, intermediate dry stage: Secondary | ICD-10-CM | POA: Diagnosis not present

## 2018-03-02 DIAGNOSIS — H02003 Unspecified entropion of right eye, unspecified eyelid: Secondary | ICD-10-CM

## 2018-03-02 DIAGNOSIS — H3581 Retinal edema: Secondary | ICD-10-CM | POA: Diagnosis not present

## 2018-03-02 DIAGNOSIS — H43813 Vitreous degeneration, bilateral: Secondary | ICD-10-CM | POA: Diagnosis not present

## 2018-03-02 DIAGNOSIS — Z961 Presence of intraocular lens: Secondary | ICD-10-CM

## 2018-03-02 MED ORDER — AFLIBERCEPT 2MG/0.05ML IZ SOLN FOR KALEIDOSCOPE
2.0000 mg | INTRAVITREAL | Status: DC
  Administered 2018-03-02: 2 mg via INTRAVITREAL

## 2018-03-14 DIAGNOSIS — M25579 Pain in unspecified ankle and joints of unspecified foot: Secondary | ICD-10-CM | POA: Diagnosis not present

## 2018-03-14 DIAGNOSIS — E1151 Type 2 diabetes mellitus with diabetic peripheral angiopathy without gangrene: Secondary | ICD-10-CM | POA: Diagnosis not present

## 2018-03-14 DIAGNOSIS — M79671 Pain in right foot: Secondary | ICD-10-CM | POA: Diagnosis not present

## 2018-03-14 DIAGNOSIS — E1140 Type 2 diabetes mellitus with diabetic neuropathy, unspecified: Secondary | ICD-10-CM | POA: Diagnosis not present

## 2018-04-06 DIAGNOSIS — N183 Chronic kidney disease, stage 3 (moderate): Secondary | ICD-10-CM | POA: Diagnosis not present

## 2018-04-06 DIAGNOSIS — E1140 Type 2 diabetes mellitus with diabetic neuropathy, unspecified: Secondary | ICD-10-CM | POA: Diagnosis not present

## 2018-04-06 DIAGNOSIS — I10 Essential (primary) hypertension: Secondary | ICD-10-CM | POA: Diagnosis not present

## 2018-04-06 DIAGNOSIS — Z79899 Other long term (current) drug therapy: Secondary | ICD-10-CM | POA: Diagnosis not present

## 2018-05-11 NOTE — Progress Notes (Signed)
[facility] Note  05/12/2018     CHIEF COMPLAINT Patient presents for Retina Follow Up   HISTORY OF PRESENT ILLNESS: [name] is a 85 y.o. female who presents to the clinic today for:   HPI    Retina Follow Up    Patient presents with  Wet AMD.  In left eye.  Severity is moderate.  Duration of 10 weeks.  Since onset it is stable.  I, the attending physician,  performed the HPI with the patient and updated documentation appropriately.          Comments    Patient states vision the same OU. Floaters improved OS. BS was 144 this am. Last a1c was 7.4 on 03-30-2018.       Last edited by [name], MD on 05/12/2018  1:33 PM. (History)    pt feels like her vision is doing well, she states she has not had any floaters  Referring physician: [name], MD [address] [place], [place] [address]  HISTORICAL INFORMATION:   Selected notes from the MEDICAL RECORD NUMBER Referred by Dr. [name] for concern of exudative AMD LEE- 02.27.19 ([facility]. [facility]) [BCVA OD: 20/40 OS: 20/70-] Ocular Hx- pseudophakia OU, lid sx (Shapiro x 24 yrs ago) PMH- DM, HTN    CURRENT MEDICATIONS: No current outpatient medications on file. (Ophthalmic Drugs)   Current Facility-Administered Medications (Ophthalmic Drugs)  Medication Route  . aflibercept (EYLEA) SOLN 2 mg Intravitreal  . aflibercept (EYLEA) SOLN 2 mg Intravitreal  . aflibercept (EYLEA) SOLN 2 mg Intravitreal  . aflibercept (EYLEA) SOLN 2 mg Intravitreal  . aflibercept (EYLEA) SOLN 2 mg Intravitreal  . aflibercept (EYLEA) SOLN 2 mg Intravitreal   Current Outpatient Medications (Other)  Medication Sig  . acetaminophen (TYLENOL) 500 MG tablet Take 500 mg by mouth every 6 (six) hours as needed. For arthritis pain  . gabapentin (NEURONTIN) 100 MG capsule Take 100 mg by mouth daily.  . insulin glargine (LANTUS) 100 UNIT/ML injection Inject 35 Units into the skin daily.   . insulin glargine (LANTUS)  100 UNIT/ML injection   . losartan-hydrochlorothiazide (HYZAAR) 100-25 MG per tablet Take 1 tablet by mouth daily.  . metFORMIN (GLUCOPHAGE) 500 MG tablet Take 500 mg by mouth daily.  . metFORMIN (GLUCOPHAGE-XR) 500 MG 24 hr tablet   . olmesartan (BENICAR) 20 MG tablet Take 20 mg by mouth daily.  Marland Kitchen sulfamethoxazole-trimethoprim (BACTRIM DS,SEPTRA DS) 800-160 MG tablet Take 1 tablet by mouth 2 (two) times daily. Take 1 bid (Patient not taking: Reported on 03/02/2018)   Current Facility-Administered Medications (Other)  Medication Route  . Bevacizumab (AVASTIN) SOLN 1.25 mg Intravitreal  . Bevacizumab (AVASTIN) SOLN 1.25 mg Intravitreal  . Bevacizumab (AVASTIN) SOLN 1.25 mg Intravitreal      REVIEW OF SYSTEMS: ROS    Positive for: Musculoskeletal, Endocrine, Eyes   Negative for: Constitutional, Gastrointestinal, Neurological, Skin, Genitourinary, HENT, Cardiovascular, Respiratory, Psychiatric, Allergic/Imm, Heme/Lymph   Last edited by Roselee Nova D on 05/12/2018  1:26 PM. (History)       ALLERGIES No Known Allergies  PAST MEDICAL HISTORY Past Medical History:  Diagnosis Date  . Arthritis   . Back pain 01/30/2015  . Diabetes mellitus without complication ([place])   . Hematuria 12/25/2014  . Hypertension   . PONV (postoperative nausea and vomiting)   . Urinary urgency 12/25/2014  . UTI (lower urinary tract infection) 11/02/2013   Past Surgical History:  Procedure Laterality Date  . ABDOMINAL HYSTERECTOMY  1960s   with bladder tack up  . BREAST SURGERY     benign, right  . C-EYE SURGERY PROCEDURE    . CATARACT EXTRACTION W/PHACO  02/08/2012   Procedure: CATARACT EXTRACTION PHACO AND INTRAOCULAR LENS PLACEMENT (IOC);  Surgeon: Elta Guadeloupe T. Gershon Crane, MD;  Location: [facility];  Service: Ophthalmology;  Laterality: Left;  CDE:11.62  . EYE SURGERY    . RECTOCELE REPAIR  1985   [facility], [place]    FAMILY HISTORY Family History  Problem Relation Age of Onset  . Diabetes Mother   . Diabetes  Brother   . Diabetes Brother   . Heart attack Brother   . Cancer Brother        lung  . Cancer Brother        lung  . Birth defects Brother        passed away at age 37; hole in heart  . Emphysema Brother     SOCIAL HISTORY Social History   Tobacco Use  . Smoking status: Never Smoker  . Smokeless tobacco: Never Used  Substance Use Topics  . Alcohol use: No  . Drug use: No         OPHTHALMIC EXAM:  Base Eye Exam    Visual Acuity (Snellen - Linear)      Right Left   Dist cc 20/60 -2 20/50 -1   Dist ph cc NI NI   Correction:  Glasses       Tonometry (Tonopen, 1:36 PM)      Right Left   Pressure 16 15       Pupils      Dark Light Shape React APD   Right 4 3 Round Brisk None   Left 4 3 Round Brisk None       Visual Fields (Counting fingers)      Left Right    Full Full       Extraocular Movement      Right Left    Full, Ortho Full, Ortho       Neuro/Psych    Oriented x3:  Yes   Mood/Affect:  Normal       Dilation    Both eyes:  1.0% Mydriacyl, 2.5% Phenylephrine @ 1:36 PM        Slit Lamp and Fundus Exam    Slit Lamp Exam      Right Left   Lids/Lashes Dermatochalasis - upper lid, Telangiectasia, Ptosis Dermatochalasis - upper lid, Telangiectasia, Meibomian gland dysfunction   Conjunctiva/Sclera White and quiet White and quiet   Cornea Arcus, Inferior 2-3 inferior+ Punctate epithelial erosions Arcus, Inferior 1+ Punctate epithelial erosions   Anterior Chamber Deep and quiet Deep and quiet   Iris Patent peripheral iridectomy at 0900, 360 peripapillary Transillumination defects at 0430 and 0800 to 0900 Round and dilated   Lens PC IOL in good position, 1+ Posterior capsular opacification PC IOL in good position, mild PC fold, 1+ non-central Posterior capsular opacification   Vitreous Posterior vitreous detachment Posterior vitreous detachment, Vitreous syneresis       Fundus Exam      Right Left   Disc Temporal Peripapillary atrophy and  pigmenation, Pink and Sharp, mild Pallor 360 Peripapillary atrophy, mild Pallor   C/D Ratio 0.4 0.4   Macula Flat, Blunted foveal reflex, RPE mottling and clumping, Drusen, RPE atrophy nasal to fovea, No heme or edema +CNVM with no edema, Blunted foveal reflex, Drusen, RPE atrophy, mottling and clumping, early sub-retinal fibrosis, No heme or edema  Vessels Vascular attenuation, AV crossing changes Vascular attenuation   Periphery Attached, mild peripheral drusen, Reticular degeneration Attached, mild peripheral drusen, mild reticular degeneration        Refraction    Wearing Rx      Sphere Cylinder Axis Add   Right -1.00 +0.75 012 +2.75   Left -0.25 +1.00 013 +2.50       Manifest Refraction (Over)      Sphere Cylinder Axis Dist VA   Right [place] +0.75 010 20/50-2   Left +0.25 +1.00 017 20/40-2          IMAGING AND PROCEDURES  Imaging and Procedures for 08/17/17  OCT, Retina - OU - Both Eyes       Right Eye Quality was good. Central Foveal Thickness: 187. Progression has been stable. Findings include normal foveal contour, no IRF, no SRF, epiretinal membrane, outer retinal atrophy (Thin choroid).   Left Eye Quality was good. Central Foveal Thickness: 214. Progression has been stable. Findings include retinal drusen , subretinal hyper-reflective material, normal foveal contour, no IRF, no SRF, pigment epithelial detachment, outer retinal atrophy (Stable improvement in SRHM/PED -- no overlying fluid, thin choroid).   Notes *Images captured and stored on drive  Diagnosis / Impression:  No DME OU Non-Exudative ARMD OD Exudative ARMD OS with stable improvement in [facility] / PED; significant ORA -- fluid remains resolved  Clinical management:  See below  Abbreviations: NFP - Normal foveal profile. CME - cystoid macular edema. PED - pigment epithelial detachment. IRF - intraretinal fluid. SRF - subretinal fluid. EZ - ellipsoid zone. ERM - epiretinal membrane. ORA - outer retinal  atrophy. ORT - outer retinal tubulation. SRHM - subretinal hyper-reflective material         Intravitreal Injection, Pharmacologic Agent - OS - Left Eye       Time Out 05/12/2018. 2:53 PM. Confirmed correct patient, procedure, site, and patient consented.   Anesthesia Topical anesthesia was used. Anesthetic medications included Lidocaine 2%, Proparacaine 0.5%.   Procedure Preparation included 5% betadine to ocular surface, eyelid speculum. A 30 gauge needle was used.   Injection:  2 mg aflibercept Alfonse Flavors) SOLN   NDC: M7179715, Lot: [id number], Expiration date: 03/19/2019   Route: Intravitreal, Site: Left Eye, Waste: 0.05 mL  Post-op Post injection exam found visual acuity of at least counting fingers. The patient tolerated the procedure well. There were no complications. The patient received written and verbal post procedure care education.                 ASSESSMENT/PLAN:    ICD-10-CM   1. Exudative age-related macular degeneration of left eye with active choroidal neovascularization (HCC) H35.3221 Intravitreal Injection, Pharmacologic Agent - OS - Left Eye    aflibercept (EYLEA) SOLN 2 mg  2. Intermediate stage nonexudative age-related macular degeneration of right eye H35.3112   3. Mild nonproliferative diabetic retinopathy of both eyes without macular edema associated with type 2 diabetes mellitus ([place]) E70.3500   4. Retinal edema H35.81 OCT, Retina - OU - Both Eyes  5. Posterior vitreous detachment of both eyes H43.813   6. Pseudophakia of both eyes Z96.1   7. Entropion of right eyelid H02.003     1. Exudative age related macular degeneration, OS    - S/P IVA OS #1 (03.05.19), #2 (04.03.19), #3 (05.01.19) -- switched to Eylea due to poor response  - Good Days approved  - S/P IVE OS #1 (05.29.19), #2 (06.26.19), #3 (07.31.19), #4 (09.11.19), #5 (11.14.19)  - BCVA  stable today at 20/50  - OCT with stably improved PED/SRHM, no overlying IRF/SRF at 10 wks  -  recommend IVE #6 OS today  - pt wishes to be treated with IVE OS #6 (01.24.20)  - RBA of procedure discussed, questions answered  - informed consent obtained and signed  - see procedure note  - Eylea4U paperwork completed -- Good Days approved  - f/u 12 weeks -- DFE/OCT   2. Age related macular degeneration, non-exudative, OD  - The incidence, anatomy, and pathology of dry AMD, risk of progression, and the AREDS and AREDS 2 study including smoking risks discussed with patient.  - stable  - continue amsler grid monitoring  3. Mild nonproliferative diabetic retinopathy w/o DME, both eyes - The incidence, risk factors for progression, natural history and treatment options for diabetic retinopathy were discussed with patient.   - The need for close monitoring of blood glucose, blood pressure, and serum lipids, avoiding cigarette or any type of tobacco, and the need for long term follow up was also discussed with patient. - exam with rare IRH  - pt unable to cooperate with fluorescencein angiogram  4. No retinal edema on exam or OCT  5. PVD / vitreous syneresis OU  Discussed findings and prognosis  No RT or RD on 360 peripheral exam  Reviewed s/s of RT/RD  Strict return precautions for any such RT/RD signs/symptoms  6. Pseudophakia OU  - s/p CE/IOL  - beautiful surgey, doing well  - monitor  7. Entropion RLL-  - s/p lower lid sx x 2 by Dr. Gershon Crane - now s/p repair with Dr. Kristeen Miss - doing well - management per Dr. Kristeen Miss  Ophthalmic Meds Ordered this visit:  Meds ordered this encounter  Medications  . aflibercept (EYLEA) SOLN 2 mg       Return in about 12 weeks (around 08/04/2018) for f/u exu ARMD OS, DFE, OCT.  There are no Patient Instructions on file for this visit.   Explained the diagnoses, plan, and follow up with the patient and they expressed understanding.  Patient expressed understanding of the importance of proper follow up care.   This document serves as a  record of services personally performed by Gardiner Sleeper, MD, PhD. It was created on their behalf by Ernest Mallick, OA, an ophthalmic assistant. The creation of this record is the provider's dictation and/or activities during the visit.    Electronically signed by: Ernest Mallick, OA  01.23.2020 8:27 AM     Gardiner Sleeper, M.D., Ph.D. Diseases & Surgery of the Retina and Vitreous [facility]  I have reviewed the above documentation for accuracy and completeness, and I agree with the above. Gardiner Sleeper, M.D., Ph.D. 05/15/18 8:28 AM     Abbreviations: M myopia (nearsighted); A astigmatism; H hyperopia (farsighted); P presbyopia; Mrx spectacle prescription;  CTL contact lenses; OD right eye; OS left eye; OU both eyes  XT exotropia; ET esotropia; PEK punctate epithelial keratitis; PEE punctate epithelial erosions; DES dry eye syndrome; MGD meibomian gland dysfunction; ATs artificial tears; PFAT's preservative free artificial tears; [place] nuclear sclerotic cataract; PSC posterior subcapsular cataract; ERM epi-retinal membrane; PVD posterior vitreous detachment; RD retinal detachment; DM diabetes mellitus; DR diabetic retinopathy; NPDR non-proliferative diabetic retinopathy; PDR proliferative diabetic retinopathy; CSME clinically significant macular edema; DME diabetic macular edema; dbh dot blot hemorrhages; CWS cotton wool spot; POAG primary open angle glaucoma; C/D cup-to-disc ratio; HVF humphrey visual field; GVF goldmann visual field; OCT optical  coherence tomography; IOP intraocular pressure; BRVO Branch retinal vein occlusion; CRVO central retinal vein occlusion; CRAO central retinal artery occlusion; BRAO branch retinal artery occlusion; RT retinal tear; SB scleral buckle; PPV pars plana vitrectomy; VH Vitreous hemorrhage; PRP panretinal laser photocoagulation; IVK intravitreal kenalog; VMT vitreomacular traction; MH Macular hole;  NVD neovascularization of the disc; NVE  neovascularization elsewhere; AREDS age related eye disease study; ARMD age related macular degeneration; POAG primary open angle glaucoma; EBMD epithelial/anterior basement membrane dystrophy; ACIOL anterior chamber intraocular lens; IOL intraocular lens; PCIOL posterior chamber intraocular lens; Phaco/IOL phacoemulsification with intraocular lens placement; [place] photorefractive keratectomy; LASIK laser assisted in situ keratomileusis; HTN hypertension; DM diabetes mellitus; COPD chronic obstructive pulmonary disease

## 2018-05-12 ENCOUNTER — Encounter (INDEPENDENT_AMBULATORY_CARE_PROVIDER_SITE_OTHER): Payer: Self-pay | Admitting: Ophthalmology

## 2018-05-12 ENCOUNTER — Ambulatory Visit (INDEPENDENT_AMBULATORY_CARE_PROVIDER_SITE_OTHER): Payer: Medicare Other | Admitting: Ophthalmology

## 2018-05-12 DIAGNOSIS — E113293 Type 2 diabetes mellitus with mild nonproliferative diabetic retinopathy without macular edema, bilateral: Secondary | ICD-10-CM | POA: Diagnosis not present

## 2018-05-12 DIAGNOSIS — H3581 Retinal edema: Secondary | ICD-10-CM | POA: Diagnosis not present

## 2018-05-12 DIAGNOSIS — H353112 Nonexudative age-related macular degeneration, right eye, intermediate dry stage: Secondary | ICD-10-CM

## 2018-05-12 DIAGNOSIS — H353221 Exudative age-related macular degeneration, left eye, with active choroidal neovascularization: Secondary | ICD-10-CM

## 2018-05-12 DIAGNOSIS — H02003 Unspecified entropion of right eye, unspecified eyelid: Secondary | ICD-10-CM

## 2018-05-12 DIAGNOSIS — H43813 Vitreous degeneration, bilateral: Secondary | ICD-10-CM

## 2018-05-12 DIAGNOSIS — Z961 Presence of intraocular lens: Secondary | ICD-10-CM

## 2018-05-15 ENCOUNTER — Encounter (INDEPENDENT_AMBULATORY_CARE_PROVIDER_SITE_OTHER): Payer: Self-pay | Admitting: Ophthalmology

## 2018-05-15 DIAGNOSIS — E1122 Type 2 diabetes mellitus with diabetic chronic kidney disease: Secondary | ICD-10-CM | POA: Diagnosis not present

## 2018-05-15 DIAGNOSIS — N183 Chronic kidney disease, stage 3 (moderate): Secondary | ICD-10-CM | POA: Diagnosis not present

## 2018-05-15 DIAGNOSIS — I10 Essential (primary) hypertension: Secondary | ICD-10-CM | POA: Diagnosis not present

## 2018-05-15 MED ORDER — AFLIBERCEPT 2MG/0.05ML IZ SOLN FOR KALEIDOSCOPE
2.0000 mg | INTRAVITREAL | Status: DC
  Administered 2018-05-15: 2 mg via INTRAVITREAL

## 2018-06-06 ENCOUNTER — Other Ambulatory Visit (INDEPENDENT_AMBULATORY_CARE_PROVIDER_SITE_OTHER): Payer: Medicare Other

## 2018-06-06 ENCOUNTER — Telehealth: Payer: Self-pay | Admitting: Adult Health

## 2018-06-06 DIAGNOSIS — R350 Frequency of micturition: Secondary | ICD-10-CM | POA: Diagnosis not present

## 2018-06-06 LAB — POCT URINALYSIS DIPSTICK OB
Blood, UA: NEGATIVE
KETONES UA: NEGATIVE
POC,PROTEIN,UA: NEGATIVE

## 2018-06-06 MED ORDER — CIPROFLOXACIN HCL 500 MG PO TABS: 500.0000 mg | ORAL_TABLET | Freq: Two times a day (BID) | ORAL | 0 refills | Status: DC

## 2018-06-06 NOTE — Telephone Encounter (Signed)
Pt aware that urine +nitates, and LEUKS and glucose, will send for UA C&S, Rx cipro

## 2018-06-06 NOTE — Progress Notes (Signed)
Pt sent urine specimen by son. +nitrate.will send urine to lab urine culture. Pad CMA

## 2018-06-06 NOTE — Addendum Note (Signed)
Addended by: Diona Fanti A on: 06/06/2018 10:01 AM   Modules accepted: Orders

## 2018-06-08 ENCOUNTER — Telehealth: Payer: Self-pay | Admitting: Adult Health

## 2018-06-08 LAB — URINE CULTURE

## 2018-06-08 NOTE — Telephone Encounter (Signed)
Pt aware that culture + E Coli, and cipro should take care of it and she is feeling better

## 2018-08-07 ENCOUNTER — Encounter (INDEPENDENT_AMBULATORY_CARE_PROVIDER_SITE_OTHER): Payer: Self-pay | Admitting: Ophthalmology

## 2018-08-07 ENCOUNTER — Encounter (INDEPENDENT_AMBULATORY_CARE_PROVIDER_SITE_OTHER): Payer: Medicare Other | Admitting: Ophthalmology

## 2018-08-22 NOTE — Progress Notes (Addendum)
[facility] Note  08/23/2018     CHIEF COMPLAINT Patient presents for Retina Evaluation   HISTORY OF PRESENT ILLNESS: [name] is a 85 y.o. female who presents to the clinic today for:   HPI    Retina Evaluation    In left eye.  This started weeks ago.  Duration of weeks.  Response to treatment was moderate improvement.  I, the attending physician,  performed the HPI with the patient and updated documentation appropriately.          Comments    Patient states her vision is improving.  She states she occasionally has double vision OD.  Patient denies eye pain or discomfort.  Patient states her floaters in her left eye have gotten much better and rarely sees them.       Last edited by [name], MD on 08/23/2018  2:19 PM. (History)    pt feels like her vision is doing well   Referring physician: [name], MD [address] [place], [place] [address]  HISTORICAL INFORMATION:   Selected notes from the MEDICAL RECORD NUMBER Referred by Dr. [name] for concern of exudative AMD LEE- 02.27.19 (W. [facility]) [BCVA OD: 20/40 OS: 20/70-] Ocular Hx- pseudophakia OU, lid sx (Shapiro x 24 yrs ago) PMH- DM, HTN    CURRENT MEDICATIONS: No current outpatient medications on file. (Ophthalmic Drugs)   Current Facility-Administered Medications (Ophthalmic Drugs)  Medication Route  . aflibercept (EYLEA) SOLN 2 mg Intravitreal  . aflibercept (EYLEA) SOLN 2 mg Intravitreal  . aflibercept (EYLEA) SOLN 2 mg Intravitreal  . aflibercept (EYLEA) SOLN 2 mg Intravitreal  . aflibercept (EYLEA) SOLN 2 mg Intravitreal  . aflibercept (EYLEA) SOLN 2 mg Intravitreal   Current Outpatient Medications (Other)  Medication Sig  . acetaminophen (TYLENOL) 500 MG tablet Take 500 mg by mouth every 6 (six) hours as needed. For arthritis pain  . ciprofloxacin (CIPRO) 500 MG tablet Take 1 tablet (500 mg total) by mouth 2 (two) times daily.  Marland Kitchen gabapentin (NEURONTIN)  100 MG capsule Take 100 mg by mouth daily.  . insulin glargine (LANTUS) 100 UNIT/ML injection Inject 35 Units into the skin daily.   . insulin glargine (LANTUS) 100 UNIT/ML injection   . losartan-hydrochlorothiazide (HYZAAR) 100-25 MG per tablet Take 1 tablet by mouth daily.  . metFORMIN (GLUCOPHAGE) 500 MG tablet Take 500 mg by mouth daily.  . metFORMIN (GLUCOPHAGE-XR) 500 MG 24 hr tablet   . olmesartan (BENICAR) 20 MG tablet Take 20 mg by mouth daily.   Current Facility-Administered Medications (Other)  Medication Route  . Bevacizumab (AVASTIN) SOLN 1.25 mg Intravitreal  . Bevacizumab (AVASTIN) SOLN 1.25 mg Intravitreal  . Bevacizumab (AVASTIN) SOLN 1.25 mg Intravitreal      REVIEW OF SYSTEMS: ROS    Positive for: Musculoskeletal, Endocrine, Eyes   Negative for: Constitutional, Gastrointestinal, Neurological, Skin, Genitourinary, HENT, Cardiovascular, Respiratory, Psychiatric, Allergic/Imm, Heme/Lymph   Last edited by [name] on 08/23/2018  2:07 PM. (History)       ALLERGIES No Known Allergies  PAST MEDICAL HISTORY Past Medical History:  Diagnosis Date  . Arthritis   . Back pain 01/30/2015  . Diabetes mellitus without complication ([place])   . Hematuria 12/25/2014  . Hypertension   . PONV (postoperative nausea and vomiting)   . Urinary urgency 12/25/2014  . UTI (lower urinary tract infection) 11/02/2013   Past Surgical History:  Procedure Laterality Date  . ABDOMINAL HYSTERECTOMY  1960s  with bladder tack up  . BREAST SURGERY     benign, right  . C-EYE SURGERY PROCEDURE    . CATARACT EXTRACTION W/PHACO  02/08/2012   Procedure: CATARACT EXTRACTION PHACO AND INTRAOCULAR LENS PLACEMENT (IOC);  Surgeon: Elta Guadeloupe T. Gershon Crane, MD;  Location: [facility];  Service: Ophthalmology;  Laterality: Left;  CDE:11.62  . EYE SURGERY    . RECTOCELE REPAIR  1985   [facility], [place]    FAMILY HISTORY Family History  Problem Relation Age of Onset  . Diabetes Mother   . Diabetes Brother    . Diabetes Brother   . Heart attack Brother   . Cancer Brother        lung  . Cancer Brother        lung  . Birth defects Brother        passed away at age 45; hole in heart  . Emphysema Brother     SOCIAL HISTORY Social History   Tobacco Use  . Smoking status: Never Smoker  . Smokeless tobacco: Never Used  Substance Use Topics  . Alcohol use: No  . Drug use: No         OPHTHALMIC EXAM:  Base Eye Exam    Visual Acuity (Snellen - Linear)      Right Left   Dist [place] 20/50 -3 20/50 -2   Dist ph [place] 20/40 -1 20/40 -2       Tonometry (Tonopen, 2:16 PM)      Right Left   Pressure 15 16       Pupils      Dark Light Shape React APD   Right 3 2 Round Minimal 0   Left 3 2 Round Minimal 0       Visual Fields      Left Right    Full Full       Extraocular Movement      Right Left    Full, Ortho Full, Ortho       Neuro/Psych    Oriented x3:  Yes   Mood/Affect:  Normal       Dilation    Both eyes:  1.0% Mydriacyl, 2.5% Phenylephrine @ 2:16 PM        Slit Lamp and Fundus Exam    Slit Lamp Exam      Right Left   Lids/Lashes Dermatochalasis - upper lid, Telangiectasia, Ptosis Dermatochalasis - upper lid, Telangiectasia, Meibomian gland dysfunction   Conjunctiva/Sclera White and quiet White and quiet   Cornea Arcus, Inferior 2-3 inferior+ Punctate epithelial erosions Arcus, Inferior 1+ Punctate epithelial erosions   Anterior Chamber Deep and quiet Deep and quiet   Iris Patent peripheral iridectomy at 0900, 360 peripapillary Transillumination defects at 0430 and 0800 to 0900 Round and dilated   Lens PC IOL in good position, 1+ Posterior capsular opacification PC IOL in good position, mild PC fold, 1+ non-central Posterior capsular opacification   Vitreous Posterior vitreous detachment Posterior vitreous detachment, Vitreous syneresis       Fundus Exam      Right Left   Disc Temporal Peripapillary atrophy and pigmenation, Pink and Sharp, mild Pallor 360  Peripapillary atrophy, mild Pallor   C/D Ratio 0.4 0.4   Macula Flat, Blunted foveal reflex, RPE mottling and clumping, Drusen, RPE atrophy nasal to fovea, No heme or edema +CNVM with no edema, Blunted foveal reflex, Drusen, RPE atrophy, mottling and clumping, early sub-retinal fibrosis, No heme or edema   Vessels Vascular attenuation, AV crossing changes Vascular  attenuation   Periphery Attached, mild peripheral drusen, Reticular degeneration Attached, mild peripheral drusen, mild reticular degeneration        Refraction    Wearing Rx      Sphere Cylinder Axis Add   Right -1.00 +0.75 012 +2.75   Left -0.25 +1.00 013 +2.50       Manifest Refraction      Sphere Cylinder Axis Dist VA Add   Right +0.00 +0.75 012 40-1 +2.75   Left -0.25 +1.00 013 40-2 +2.75          IMAGING AND PROCEDURES  Imaging and Procedures for 08/17/17  OCT, Retina - OU - Both Eyes       Right Eye Quality was good. Central Foveal Thickness: 186. Progression has been stable. Findings include normal foveal contour, no IRF, no SRF, epiretinal membrane, outer retinal atrophy (Thin choroid).   Left Eye Quality was good. Central Foveal Thickness: 216. Progression has been stable. Findings include retinal drusen , subretinal hyper-reflective material, normal foveal contour, no IRF, no SRF, pigment epithelial detachment, outer retinal atrophy (Stable improvement in SRHM/PED -- no overlying fluid, thin choroid).   Notes *Images captured and stored on drive  Diagnosis / Impression:  No DME OU Non-Exudative ARMD OD Exudative ARMD OS with stable improvement in [facility] / PED; significant ORA -- fluid remains resolved  Clinical management:  See below  Abbreviations: NFP - Normal foveal profile. CME - cystoid macular edema. PED - pigment epithelial detachment. IRF - intraretinal fluid. SRF - subretinal fluid. EZ - ellipsoid zone. ERM - epiretinal membrane. ORA - outer retinal atrophy. ORT - outer retinal tubulation.  SRHM - subretinal hyper-reflective material         Intravitreal Injection, Pharmacologic Agent - OS - Left Eye       Time Out 08/23/2018. 2:44 PM. Confirmed correct patient, procedure, site, and patient consented.   Anesthesia Topical anesthesia was used. Anesthetic medications included Lidocaine 2%, Proparacaine 0.5%.   Procedure Preparation included 5% betadine to ocular surface, eyelid speculum. A 30 gauge needle was used.   Injection:  2 mg aflibercept Alfonse Flavors) SOLN   NDC: A3590391, Lot: [id number], Expiration date: 12/17/2018   Route: Intravitreal, Site: Left Eye, Waste: 0.05 mL  Post-op Post injection exam found visual acuity of at least counting fingers. The patient tolerated the procedure well. There were no complications. The patient received written and verbal post procedure care education.                 ASSESSMENT/PLAN:    ICD-10-CM   1. Exudative age-related macular degeneration of left eye with active choroidal neovascularization (HCC) H35.3221 Intravitreal Injection, Pharmacologic Agent - OS - Left Eye    aflibercept (EYLEA) SOLN 2 mg  2. Intermediate stage nonexudative age-related macular degeneration of right eye H35.3112   3. Mild nonproliferative diabetic retinopathy of both eyes without macular edema associated with type 2 diabetes mellitus ([place]) S28.3151   4. Retinal edema H35.81 OCT, Retina - OU - Both Eyes  5. Posterior vitreous detachment of both eyes H43.813   6. Pseudophakia of both eyes Z96.1   7. Entropion of right eyelid H02.003     1. Exudative age related macular degeneration, OS    - S/P IVA OS #1 (03.05.19), #2 (04.03.19), #3 (05.01.19) -- switched to Eylea due to poor response  - Good Days approved  - S/P IVE OS #1 (05.29.19), #2 (06.26.19), #3 (07.31.19), #4 (09.11.19), #5 (11.14.19), #6 (01.24.20)  - f/u delayed to 14.5  weeks from 12 due to COVID-19  - BCVA improved to today at 20/40  - OCT with stably improved PED/SRHM, no  overlying IRF/SRF at 14.5 wks  - recommend IVE #6 OS today for maintenance with extension to 4 months  - pt wishes to be treated with IVE OS #7 (05.06.20)  - RBA of procedure discussed, questions answered  - informed consent obtained and signed  - see procedure note  - Eylea4U paperwork completed -- Good Days approved  - f/u 16 weeks -- DFE/OCT   2. Age related macular degeneration, non-exudative, OD  - The incidence, anatomy, and pathology of dry AMD, risk of progression, and the AREDS and AREDS 2 study including smoking risks discussed with patient.  - stable  - continue amsler grid monitoring  3. Mild nonproliferative diabetic retinopathy w/o DME, both eyes - The incidence, risk factors for progression, natural history and treatment options for diabetic retinopathy were discussed with patient.   - The need for close monitoring of blood glucose, blood pressure, and serum lipids, avoiding cigarette or any type of tobacco, and the need for long term follow up was also discussed with patient. - exam with rare IRH  - pt unable to cooperate with fluorescencein angiogram  4. No retinal edema on exam or OCT  5. PVD / vitreous syneresis OU  Discussed findings and prognosis  No RT or RD on 360 peripheral exam  Reviewed s/s of RT/RD  Strict return precautions for any such RT/RD signs/symptoms  6. Pseudophakia OU  - s/p CE/IOL  - beautiful surgey, doing well  - monitor  7. Entropion RLL-  - s/p lower lid sx x 2 by Dr. Gershon Crane - now s/p repair with Dr. Kristeen Miss - doing well - management per Dr. Kristeen Miss  Ophthalmic Meds Ordered this visit:  Meds ordered this encounter  Medications  . aflibercept (EYLEA) SOLN 2 mg       Return in about 16 weeks (around 12/13/2018) for f/u exu ARMD OS, DFE, OCT.  There are no Patient Instructions on file for this visit.   Explained the diagnoses, plan, and follow up with the patient and they expressed understanding.  Patient expressed understanding  of the importance of proper follow up care.   This document serves as a record of services personally performed by Gardiner Sleeper, MD, PhD. It was created on their behalf by Ernest Mallick, OA, an ophthalmic assistant. The creation of this record is the provider's dictation and/or activities during the visit.    Electronically signed by: Ernest Mallick, OA  05.05.2020 3:22 PM    Gardiner Sleeper, M.D., Ph.D. Diseases & Surgery of the Retina and Vitreous [facility]  I have reviewed the above documentation for accuracy and completeness, and I agree with the above. Gardiner Sleeper, M.D., Ph.D. 08/23/18 3:22 PM    Abbreviations: M myopia (nearsighted); A astigmatism; H hyperopia (farsighted); P presbyopia; Mrx spectacle prescription;  CTL contact lenses; OD right eye; OS left eye; OU both eyes  XT exotropia; ET esotropia; PEK punctate epithelial keratitis; PEE punctate epithelial erosions; DES dry eye syndrome; MGD meibomian gland dysfunction; ATs artificial tears; PFAT's preservative free artificial tears; [place] nuclear sclerotic cataract; PSC posterior subcapsular cataract; ERM epi-retinal membrane; PVD posterior vitreous detachment; RD retinal detachment; DM diabetes mellitus; DR diabetic retinopathy; NPDR non-proliferative diabetic retinopathy; PDR proliferative diabetic retinopathy; CSME clinically significant macular edema; DME diabetic macular edema; dbh dot blot hemorrhages; CWS cotton wool spot; POAG primary open  angle glaucoma; C/D cup-to-disc ratio; HVF humphrey visual field; GVF goldmann visual field; OCT optical coherence tomography; IOP intraocular pressure; BRVO Branch retinal vein occlusion; CRVO central retinal vein occlusion; CRAO central retinal artery occlusion; BRAO branch retinal artery occlusion; RT retinal tear; SB scleral buckle; PPV pars plana vitrectomy; VH Vitreous hemorrhage; PRP panretinal laser photocoagulation; IVK intravitreal kenalog; VMT vitreomacular  traction; MH Macular hole;  NVD neovascularization of the disc; NVE neovascularization elsewhere; AREDS age related eye disease study; ARMD age related macular degeneration; POAG primary open angle glaucoma; EBMD epithelial/anterior basement membrane dystrophy; ACIOL anterior chamber intraocular lens; IOL intraocular lens; PCIOL posterior chamber intraocular lens; Phaco/IOL phacoemulsification with intraocular lens placement; [place] photorefractive keratectomy; LASIK laser assisted in situ keratomileusis; HTN hypertension; DM diabetes mellitus; COPD chronic obstructive pulmonary disease

## 2018-08-23 ENCOUNTER — Ambulatory Visit (INDEPENDENT_AMBULATORY_CARE_PROVIDER_SITE_OTHER): Payer: Medicare Other | Admitting: Ophthalmology

## 2018-08-23 ENCOUNTER — Encounter (INDEPENDENT_AMBULATORY_CARE_PROVIDER_SITE_OTHER): Payer: Self-pay | Admitting: Ophthalmology

## 2018-08-23 ENCOUNTER — Other Ambulatory Visit: Payer: Self-pay

## 2018-08-23 DIAGNOSIS — H3581 Retinal edema: Secondary | ICD-10-CM | POA: Diagnosis not present

## 2018-08-23 DIAGNOSIS — H02003 Unspecified entropion of right eye, unspecified eyelid: Secondary | ICD-10-CM

## 2018-08-23 DIAGNOSIS — E113293 Type 2 diabetes mellitus with mild nonproliferative diabetic retinopathy without macular edema, bilateral: Secondary | ICD-10-CM | POA: Diagnosis not present

## 2018-08-23 DIAGNOSIS — H353221 Exudative age-related macular degeneration, left eye, with active choroidal neovascularization: Secondary | ICD-10-CM

## 2018-08-23 DIAGNOSIS — H43813 Vitreous degeneration, bilateral: Secondary | ICD-10-CM

## 2018-08-23 DIAGNOSIS — H353112 Nonexudative age-related macular degeneration, right eye, intermediate dry stage: Secondary | ICD-10-CM

## 2018-08-23 DIAGNOSIS — Z961 Presence of intraocular lens: Secondary | ICD-10-CM

## 2018-08-23 MED ORDER — AFLIBERCEPT 2MG/0.05ML IZ SOLN FOR KALEIDOSCOPE
2.0000 mg | INTRAVITREAL | Status: AC | PRN
  Administered 2018-08-23: 15:00:00 2 mg via INTRAVITREAL

## 2018-09-21 DIAGNOSIS — E1129 Type 2 diabetes mellitus with other diabetic kidney complication: Secondary | ICD-10-CM | POA: Diagnosis not present

## 2018-09-21 DIAGNOSIS — N183 Chronic kidney disease, stage 3 (moderate): Secondary | ICD-10-CM | POA: Diagnosis not present

## 2018-09-21 DIAGNOSIS — Z79899 Other long term (current) drug therapy: Secondary | ICD-10-CM | POA: Diagnosis not present

## 2018-09-21 DIAGNOSIS — I10 Essential (primary) hypertension: Secondary | ICD-10-CM | POA: Diagnosis not present

## 2018-09-28 DIAGNOSIS — N183 Chronic kidney disease, stage 3 (moderate): Secondary | ICD-10-CM | POA: Diagnosis not present

## 2018-09-28 DIAGNOSIS — E1122 Type 2 diabetes mellitus with diabetic chronic kidney disease: Secondary | ICD-10-CM | POA: Diagnosis not present

## 2018-10-26 ENCOUNTER — Telehealth: Payer: Self-pay | Admitting: *Deleted

## 2018-10-26 ENCOUNTER — Other Ambulatory Visit (INDEPENDENT_AMBULATORY_CARE_PROVIDER_SITE_OTHER): Payer: Medicare Other

## 2018-10-26 ENCOUNTER — Other Ambulatory Visit: Payer: Self-pay

## 2018-10-26 DIAGNOSIS — R300 Dysuria: Secondary | ICD-10-CM

## 2018-10-26 DIAGNOSIS — R309 Painful micturition, unspecified: Secondary | ICD-10-CM

## 2018-10-26 LAB — POCT URINALYSIS DIPSTICK
Glucose, UA: NEGATIVE
Ketones, UA: NEGATIVE
Nitrite, UA: NEGATIVE
Protein, UA: NEGATIVE

## 2018-10-26 MED ORDER — CIPROFLOXACIN HCL 500 MG PO TABS: 500.0000 mg | ORAL_TABLET | Freq: Two times a day (BID) | ORAL | 0 refills | Status: DC

## 2018-10-26 NOTE — Progress Notes (Signed)
Pt having pain and burning with urination. Urine dip was + for blood and leuks. I spoke with JAG and will send urine to lab for culture and urinalysis. JAG will send med to [facility]. Advised to call next week for results. Pt voiced understanding. [place]

## 2018-10-26 NOTE — Telephone Encounter (Signed)
Urine sent for UA C&S , will rx cipro for burin ing with urination.

## 2018-10-26 NOTE — Telephone Encounter (Signed)
Pt has pain and burning with urination. Please send med to [facility]. Thanks!! [place]

## 2018-10-27 LAB — URINALYSIS, ROUTINE W REFLEX MICROSCOPIC
Bilirubin, UA: NEGATIVE
Glucose, UA: NEGATIVE
Ketones, UA: NEGATIVE
Nitrite, UA: NEGATIVE
Protein,UA: NEGATIVE
RBC, UA: NEGATIVE
Specific Gravity, UA: 1.012 (ref 1.005–1.030)
Urobilinogen, Ur: 0.2 mg/dL (ref 0.2–1.0)
pH, UA: 6 (ref 5.0–7.5)

## 2018-10-27 LAB — MICROSCOPIC EXAMINATION
Casts: NONE SEEN /lpf
Epithelial Cells (non renal): NONE SEEN /hpf (ref 0–10)
WBC, UA: >30 /hpf — AB (ref 0–5)

## 2018-10-30 LAB — URINE CULTURE

## 2018-11-02 ENCOUNTER — Telehealth: Payer: Self-pay | Admitting: Adult Health

## 2018-11-02 NOTE — Telephone Encounter (Signed)
Pt is feeling better, no pain or burning and finishes Cipro tonight

## 2018-11-23 DIAGNOSIS — M25579 Pain in unspecified ankle and joints of unspecified foot: Secondary | ICD-10-CM | POA: Diagnosis not present

## 2018-11-23 DIAGNOSIS — M79671 Pain in right foot: Secondary | ICD-10-CM | POA: Diagnosis not present

## 2018-11-23 DIAGNOSIS — M79672 Pain in left foot: Secondary | ICD-10-CM | POA: Diagnosis not present

## 2018-12-21 DIAGNOSIS — E1140 Type 2 diabetes mellitus with diabetic neuropathy, unspecified: Secondary | ICD-10-CM | POA: Diagnosis not present

## 2018-12-21 DIAGNOSIS — M79672 Pain in left foot: Secondary | ICD-10-CM | POA: Diagnosis not present

## 2018-12-21 DIAGNOSIS — E1151 Type 2 diabetes mellitus with diabetic peripheral angiopathy without gangrene: Secondary | ICD-10-CM | POA: Diagnosis not present

## 2018-12-21 DIAGNOSIS — M79671 Pain in right foot: Secondary | ICD-10-CM | POA: Diagnosis not present

## 2018-12-21 DIAGNOSIS — M25579 Pain in unspecified ankle and joints of unspecified foot: Secondary | ICD-10-CM | POA: Diagnosis not present

## 2018-12-26 ENCOUNTER — Encounter (INDEPENDENT_AMBULATORY_CARE_PROVIDER_SITE_OTHER): Payer: Medicare Other | Admitting: Ophthalmology

## 2018-12-26 DIAGNOSIS — E1139 Type 2 diabetes mellitus with other diabetic ophthalmic complication: Secondary | ICD-10-CM | POA: Diagnosis not present

## 2018-12-26 DIAGNOSIS — N183 Chronic kidney disease, stage 3 (moderate): Secondary | ICD-10-CM | POA: Diagnosis not present

## 2018-12-26 DIAGNOSIS — Z79899 Other long term (current) drug therapy: Secondary | ICD-10-CM | POA: Diagnosis not present

## 2018-12-26 DIAGNOSIS — E1140 Type 2 diabetes mellitus with diabetic neuropathy, unspecified: Secondary | ICD-10-CM | POA: Diagnosis not present

## 2018-12-26 DIAGNOSIS — E1129 Type 2 diabetes mellitus with other diabetic kidney complication: Secondary | ICD-10-CM | POA: Diagnosis not present

## 2019-01-02 DIAGNOSIS — E1122 Type 2 diabetes mellitus with diabetic chronic kidney disease: Secondary | ICD-10-CM | POA: Diagnosis not present

## 2019-01-02 DIAGNOSIS — N390 Urinary tract infection, site not specified: Secondary | ICD-10-CM | POA: Diagnosis not present

## 2019-01-02 DIAGNOSIS — M791 Myalgia, unspecified site: Secondary | ICD-10-CM | POA: Diagnosis not present

## 2019-01-02 DIAGNOSIS — N183 Chronic kidney disease, stage 3 (moderate): Secondary | ICD-10-CM | POA: Diagnosis not present

## 2019-01-03 ENCOUNTER — Ambulatory Visit (INDEPENDENT_AMBULATORY_CARE_PROVIDER_SITE_OTHER): Payer: Medicare Other | Admitting: Ophthalmology

## 2019-01-03 ENCOUNTER — Other Ambulatory Visit: Payer: Self-pay

## 2019-01-03 ENCOUNTER — Encounter (INDEPENDENT_AMBULATORY_CARE_PROVIDER_SITE_OTHER): Payer: Self-pay | Admitting: Ophthalmology

## 2019-01-03 DIAGNOSIS — H3581 Retinal edema: Secondary | ICD-10-CM

## 2019-01-03 DIAGNOSIS — E113293 Type 2 diabetes mellitus with mild nonproliferative diabetic retinopathy without macular edema, bilateral: Secondary | ICD-10-CM

## 2019-01-03 DIAGNOSIS — H02003 Unspecified entropion of right eye, unspecified eyelid: Secondary | ICD-10-CM

## 2019-01-03 DIAGNOSIS — H353112 Nonexudative age-related macular degeneration, right eye, intermediate dry stage: Secondary | ICD-10-CM

## 2019-01-03 DIAGNOSIS — H353221 Exudative age-related macular degeneration, left eye, with active choroidal neovascularization: Secondary | ICD-10-CM | POA: Diagnosis not present

## 2019-01-03 DIAGNOSIS — Z961 Presence of intraocular lens: Secondary | ICD-10-CM

## 2019-01-03 DIAGNOSIS — H43813 Vitreous degeneration, bilateral: Secondary | ICD-10-CM

## 2019-01-03 MED ORDER — AFLIBERCEPT 2MG/0.05ML IZ SOLN FOR KALEIDOSCOPE
2.0000 mg | INTRAVITREAL | Status: AC | PRN
  Administered 2019-01-03: 21:00:00 2 mg via INTRAVITREAL

## 2019-01-03 NOTE — Progress Notes (Signed)
[facility] Note  01/03/2019     CHIEF COMPLAINT Patient presents for Retina Follow Up   HISTORY OF PRESENT ILLNESS: [name] is a 85 y.o. female who presents to the clinic today for:   HPI    Retina Follow Up    Patient presents with  Wet AMD.  In left eye.  This started weeks ago.  Severity is moderate.  Duration of weeks.  Since onset it is stable.  I, the attending physician,  performed the HPI with the patient and updated documentation appropriately.          Comments    Patient states her vision is stable.  Patient complains of dryness OU and the feeling of swelling in the mornings OU.  Patient denies eye pain.  Patient denies floaters or fol OU.       Last edited by [name], MD on 01/03/2019  2:42 PM. (History)    pt states her vision is doing well, she states she is late to follow up bc her driver was sick last time   Referring physician: [name], MD [address] [place],  [place] [address]  HISTORICAL INFORMATION:   Selected notes from the [place] Referred by Dr. [name] for concern of exudative AMD LEE- 02.27.19 (W. [facility]) [BCVA OD: 20/40 OS: 20/70-] Ocular Hx- pseudophakia OU, lid sx (Shapiro x 24 yrs ago) PMH- DM, HTN    CURRENT MEDICATIONS: No current outpatient medications on file. (Ophthalmic Drugs)   Current Facility-Administered Medications (Ophthalmic Drugs)  Medication Route  . aflibercept (EYLEA) SOLN 2 mg Intravitreal  . aflibercept (EYLEA) SOLN 2 mg Intravitreal  . aflibercept (EYLEA) SOLN 2 mg Intravitreal  . aflibercept (EYLEA) SOLN 2 mg Intravitreal  . aflibercept (EYLEA) SOLN 2 mg Intravitreal  . aflibercept (EYLEA) SOLN 2 mg Intravitreal   Current Outpatient Medications (Other)  Medication Sig  . acetaminophen (TYLENOL) 500 MG tablet Take 500 mg by mouth every 6 (six) hours as needed. For arthritis pain  . ciprofloxacin (CIPRO) 500 MG tablet Take 1 tablet (500 mg total)  by mouth 2 (two) times daily.  Marland Kitchen gabapentin (NEURONTIN) 100 MG capsule Take 100 mg by mouth daily.  . insulin glargine (LANTUS) 100 UNIT/ML injection Inject 35 Units into the skin daily.   . insulin glargine (LANTUS) 100 UNIT/ML injection   . losartan-hydrochlorothiazide (HYZAAR) 100-25 MG per tablet Take 1 tablet by mouth daily.  . metFORMIN (GLUCOPHAGE) 500 MG tablet Take 500 mg by mouth daily.  . metFORMIN (GLUCOPHAGE-XR) 500 MG 24 hr tablet   . olmesartan (BENICAR) 20 MG tablet Take 20 mg by mouth daily.   Current Facility-Administered Medications (Other)  Medication Route  . Bevacizumab (AVASTIN) SOLN 1.25 mg Intravitreal  . Bevacizumab (AVASTIN) SOLN 1.25 mg Intravitreal  . Bevacizumab (AVASTIN) SOLN 1.25 mg Intravitreal      REVIEW OF SYSTEMS: ROS    Positive for: Musculoskeletal, Endocrine, Eyes   Negative for: Constitutional, Gastrointestinal, Neurological, Skin, Genitourinary, HENT, Cardiovascular, Respiratory, Psychiatric, Allergic/Imm, Heme/Lymph   Last edited by [name] on 01/03/2019  2:03 PM. (History)       ALLERGIES No Known Allergies  PAST MEDICAL HISTORY Past Medical History:  Diagnosis Date  . Arthritis   . Back pain 01/30/2015  . Diabetes mellitus without complication ([place])   . Hematuria 12/25/2014  . Hypertension   . PONV (postoperative nausea and vomiting)   . Urinary urgency 12/25/2014  . UTI (lower  urinary tract infection) 11/02/2013   Past Surgical History:  Procedure Laterality Date  . ABDOMINAL HYSTERECTOMY  1960s   with bladder tack up  . BREAST SURGERY     benign, right  . C-EYE SURGERY PROCEDURE    . CATARACT EXTRACTION W/PHACO  02/08/2012   Procedure: CATARACT EXTRACTION PHACO AND INTRAOCULAR LENS PLACEMENT (IOC);  Surgeon: Elta Guadeloupe T. Gershon Crane, MD;  Location: [facility];  Service: Ophthalmology;  Laterality: Left;  CDE:11.62  . EYE SURGERY    . RECTOCELE REPAIR  1985   [facility], [place]    FAMILY HISTORY Family History  Problem Relation  Age of Onset  . Diabetes Mother   . Diabetes Brother   . Diabetes Brother   . Heart attack Brother   . Cancer Brother        lung  . Cancer Brother        lung  . Birth defects Brother        passed away at age 40; hole in heart  . Emphysema Brother     SOCIAL HISTORY Social History   Tobacco Use  . Smoking status: Never Smoker  . Smokeless tobacco: Never Used  Substance Use Topics  . Alcohol use: No  . Drug use: No         OPHTHALMIC EXAM:  Base Eye Exam    Visual Acuity (Snellen - Linear)      Right Left   Dist cc 20/50 +2 20/60 -1   Correction: Glasses       Tonometry (Tonopen, 2:05 PM)      Right Left   Pressure 19 21       Pupils      Dark Light Shape React APD   Right 3 2 Round Minimal 0   Left 3 2 Round Minimal 0       Visual Fields      Left Right    Full Full       Extraocular Movement      Right Left    Full Full       Neuro/Psych    Oriented x3: Yes   Mood/Affect: Normal       Dilation    Both eyes: 1.0% Mydriacyl, 2.5% Phenylephrine @ 2:06 PM        Slit Lamp and Fundus Exam    Slit Lamp Exam      Right Left   Lids/Lashes Dermatochalasis - upper lid, Telangiectasia, Ptosis Dermatochalasis - upper lid, Telangiectasia, Meibomian gland dysfunction   Conjunctiva/Sclera White and quiet White and quiet   Cornea Arcus, Inferior 2-3 inferior+ Punctate epithelial erosions Arcus, Inferior 1+ Punctate epithelial erosions   Anterior Chamber Deep and quiet Deep and quiet   Iris Patent peripheral iridectomy at 0900, 360 peripapillary Transillumination defects at 0430 and 0800 to 0900 Round and dilated   Lens PC IOL in good position, 1+ Posterior capsular opacification PC IOL in good position, mild PC fold, 1+ non-central Posterior capsular opacification   Vitreous Posterior vitreous detachment Posterior vitreous detachment, Vitreous syneresis       Fundus Exam      Right Left   Disc Temporal Peripapillary atrophy and pigmenation, Pink  and Sharp, mild Pallor 360 Peripapillary atrophy, mild Pallor   C/D Ratio 0.2 0.4   Macula Flat, Blunted foveal reflex, RPE mottling and clumping, Drusen, RPE atrophy nasal to fovea, No heme or edema +CNVM with no edema, Blunted foveal reflex, Drusen, RPE atrophy, mottling and clumping, early sub-retinal fibrosis, No heme  or edema   Vessels Vascular attenuation, AV crossing changes Vascular attenuation   Periphery Attached, mild peripheral drusen, Reticular degeneration Attached, mild peripheral drusen, mild reticular degeneration        Refraction    Wearing Rx      Sphere Cylinder Axis Add   Right -1.00 +0.75 012 +2.75   Left -0.25 +1.00 013 +2.50          IMAGING AND PROCEDURES  Imaging and Procedures for 08/17/17  OCT, Retina - OU - Both Eyes       Right Eye Quality was good. Central Foveal Thickness: 190. Progression has been stable. Findings include normal foveal contour, no IRF, no SRF, epiretinal membrane, outer retinal atrophy (Thin choroid).   Left Eye Quality was good. Central Foveal Thickness: 214. Progression has been stable. Findings include retinal drusen , subretinal hyper-reflective material, normal foveal contour, no IRF, no SRF, pigment epithelial detachment, outer retinal atrophy, epiretinal membrane (Stable improvement in SRHM/PED -- no overlying fluid, thin choroid).   Notes *Images captured and stored on drive  Diagnosis / Impression:  No DME OU Non-Exudative ARMD OD Exudative ARMD OS with stable improvement in [facility] / PED; significant ORA -- fluid remains resolved  Clinical management:  See below  Abbreviations: NFP - Normal foveal profile. CME - cystoid macular edema. PED - pigment epithelial detachment. IRF - intraretinal fluid. SRF - subretinal fluid. EZ - ellipsoid zone. ERM - epiretinal membrane. ORA - outer retinal atrophy. ORT - outer retinal tubulation. SRHM - subretinal hyper-reflective material         Intravitreal Injection,  Pharmacologic Agent - OS - Left Eye       Time Out 01/03/2019. 2:04 PM. Confirmed correct patient, procedure, site, and patient consented.   Anesthesia Topical anesthesia was used. Anesthetic medications included Lidocaine 2%, Proparacaine 0.5%.   Procedure Preparation included 5% betadine to ocular surface, eyelid speculum. A 30 gauge needle was used.   Injection:  2 mg aflibercept Alfonse Flavors) SOLN   NDC: [id number], Lot: [id number], Expiration date: 07/07/2019   Route: Intravitreal, Site: Left Eye, Waste: 0.05 mL  Post-op Post injection exam found visual acuity of at least counting fingers. The patient tolerated the procedure well. There were no complications. The patient received written and verbal post procedure care education.                 ASSESSMENT/PLAN:    ICD-10-CM   1. Exudative age-related macular degeneration of left eye with active choroidal neovascularization (HCC)  H35.3221 Intravitreal Injection, Pharmacologic Agent - OS - Left Eye    aflibercept (EYLEA) SOLN 2 mg  2. Intermediate stage nonexudative age-related macular degeneration of right eye  H35.3112   3. Mild nonproliferative diabetic retinopathy of both eyes without macular edema associated with type 2 diabetes mellitus ([place])  [id number]   4. Retinal edema  H35.81 OCT, Retina - OU - Both Eyes  5. Posterior vitreous detachment of both eyes  H43.813   6. Pseudophakia of both eyes  Z96.1   7. Entropion of right eyelid  H02.003     1. Exudative age related macular degeneration, OS    - slightly delayed follow up from 16 weeks to 19 wks due to transportation  - S/P IVA OS #1 (03.05.19), #2 (04.03.19), #3 (05.01.19) -- switched to Eylea due to poor response  - Good Days approved  - S/P IVE OS #1 (05.29.19), #2 (06.26.19), #3 (07.31.19), #4 (09.11.19), #5 (11.14.19), #6 (01.24.20), #7 (05.06.20)  - BCVA 20/60  OS  - OCT with stably improved PED/SRHM, no overlying IRF/SRF at 19 wks  - recommend IVE #8 OS  today for maintenance  - pt wishes to proceed  - RBA of procedure discussed, questions answered  - informed consent obtained and signed  - see procedure note  - Eylea4U paperwork completed -- Good Days approved  - f/u 16 weeks -- DFE/OCT/possible injxn   2. Age related macular degeneration, non-exudative, OD  - The incidence, anatomy, and pathology of dry AMD, risk of progression, and the AREDS and AREDS 2 study including smoking risks discussed with patient.  - stable  - continue amsler grid monitoring  3. Mild nonproliferative diabetic retinopathy w/o DME, both eyes  - The incidence, risk factors for progression, natural history and treatment options for diabetic retinopathy were discussed with patient.    - The need for close monitoring of blood glucose, blood pressure, and serum lipids, avoiding cigarette or any type of tobacco, and the need for long term follow up was also discussed with patient.  - exam with rare IRH   - pt unable to cooperate with fluorescencein angiogram  4. No retinal edema on exam or OCT  5. PVD / vitreous syneresis OU  - Discussed findings and prognosis  - No RT or RD on 360 peripheral exam  - Reviewed s/s of RT/RD  - Strict return precautions for any such RT/RD signs/symptoms  6. Pseudophakia OU  - s/p CE/IOL  - beautiful surgey, doing well  - monitor  7. Entropion RLL-   - s/p lower lid sx x 2 by Dr. Gershon Crane  - now s/p repair with Dr. Kristeen Miss  - doing well  - management per Dr. Kristeen Miss  Ophthalmic Meds Ordered this visit:  Meds ordered this encounter  Medications  . aflibercept (EYLEA) SOLN 2 mg       Return in about 16 weeks (around 04/25/2019) for f/u exu ARMD OS, DFE, OCT.  There are no Patient Instructions on file for this visit.   Explained the diagnoses, plan, and follow up with the patient and they expressed understanding.  Patient expressed understanding of the importance of proper follow up care.   This document serves as a record  of services personally performed by Gardiner Sleeper, MD, PhD. It was created on their behalf by Ernest Mallick, OA, an ophthalmic assistant. The creation of this record is the provider's dictation and/or activities during the visit.    Electronically signed by: Ernest Mallick, OA  09.16.2020 9:17 PM    Gardiner Sleeper, M.D., Ph.D. Diseases & Surgery of the Retina and Vitreous [facility]  I have reviewed the above documentation for accuracy and completeness, and I agree with the above. Gardiner Sleeper, M.D., Ph.D. 01/03/19 9:17 PM    Abbreviations: M myopia (nearsighted); A astigmatism; H hyperopia (farsighted); P presbyopia; Mrx spectacle prescription;  CTL contact lenses; OD right eye; OS left eye; OU both eyes  XT exotropia; ET esotropia; PEK punctate epithelial keratitis; PEE punctate epithelial erosions; DES dry eye syndrome; MGD meibomian gland dysfunction; ATs artificial tears; PFAT's preservative free artificial tears; [place] nuclear sclerotic cataract; PSC posterior subcapsular cataract; ERM epi-retinal membrane; PVD posterior vitreous detachment; RD retinal detachment; DM diabetes mellitus; DR diabetic retinopathy; NPDR non-proliferative diabetic retinopathy; PDR proliferative diabetic retinopathy; CSME clinically significant macular edema; DME diabetic macular edema; dbh dot blot hemorrhages; CWS cotton wool spot; POAG primary open angle glaucoma; C/D cup-to-disc ratio; HVF humphrey visual field; GVF goldmann  visual field; OCT optical coherence tomography; IOP intraocular pressure; BRVO Branch retinal vein occlusion; CRVO central retinal vein occlusion; CRAO central retinal artery occlusion; BRAO branch retinal artery occlusion; RT retinal tear; SB scleral buckle; PPV pars plana vitrectomy; VH Vitreous hemorrhage; PRP panretinal laser photocoagulation; IVK intravitreal kenalog; VMT vitreomacular traction; MH Macular hole;  NVD neovascularization of the disc; NVE  neovascularization elsewhere; AREDS age related eye disease study; ARMD age related macular degeneration; POAG primary open angle glaucoma; EBMD epithelial/anterior basement membrane dystrophy; ACIOL anterior chamber intraocular lens; IOL intraocular lens; PCIOL posterior chamber intraocular lens; Phaco/IOL phacoemulsification with intraocular lens placement; [place] photorefractive keratectomy; LASIK laser assisted in situ keratomileusis; HTN hypertension; DM diabetes mellitus; COPD chronic obstructive pulmonary disease

## 2019-02-01 DIAGNOSIS — M79671 Pain in right foot: Secondary | ICD-10-CM | POA: Diagnosis not present

## 2019-02-01 DIAGNOSIS — E1140 Type 2 diabetes mellitus with diabetic neuropathy, unspecified: Secondary | ICD-10-CM | POA: Diagnosis not present

## 2019-02-01 DIAGNOSIS — M79672 Pain in left foot: Secondary | ICD-10-CM | POA: Diagnosis not present

## 2019-02-01 DIAGNOSIS — E1151 Type 2 diabetes mellitus with diabetic peripheral angiopathy without gangrene: Secondary | ICD-10-CM | POA: Diagnosis not present

## 2019-02-01 DIAGNOSIS — M25579 Pain in unspecified ankle and joints of unspecified foot: Secondary | ICD-10-CM | POA: Diagnosis not present

## 2019-03-28 DIAGNOSIS — E1129 Type 2 diabetes mellitus with other diabetic kidney complication: Secondary | ICD-10-CM | POA: Diagnosis not present

## 2019-03-28 DIAGNOSIS — Z79899 Other long term (current) drug therapy: Secondary | ICD-10-CM | POA: Diagnosis not present

## 2019-03-28 DIAGNOSIS — N183 Chronic kidney disease, stage 3 unspecified: Secondary | ICD-10-CM | POA: Diagnosis not present

## 2019-04-03 DIAGNOSIS — I10 Essential (primary) hypertension: Secondary | ICD-10-CM | POA: Diagnosis not present

## 2019-04-03 DIAGNOSIS — E1122 Type 2 diabetes mellitus with diabetic chronic kidney disease: Secondary | ICD-10-CM | POA: Diagnosis not present

## 2019-04-03 DIAGNOSIS — N1832 Chronic kidney disease, stage 3b: Secondary | ICD-10-CM | POA: Diagnosis not present

## 2019-04-24 NOTE — Progress Notes (Signed)
[facility] Note  04/25/2019     CHIEF COMPLAINT Patient presents for Retina Follow Up   HISTORY OF PRESENT ILLNESS: [name] is a 86 y.o. female who presents to the clinic today for:   HPI    Retina Follow Up    Patient presents with  Dry AMD.  In left eye.  This started 1 year ago.  Severity is mild.  Since onset it is stable.  I, the attending physician,  performed the HPI with the patient and updated documentation appropriately.          Comments    F/U EXU ARMD OS. Patient states her vision is "good", denies new visual onsets/issues        Last edited by [name], MD on 04/25/2019  1:59 PM. (History)    pt states she is doing well, she has not noticed a change in vision   Referring physician: [name], MD [address] [place],  [place] [address]  HISTORICAL INFORMATION:   Selected notes from the MEDICAL RECORD NUMBER Referred by Dr. [name] for concern of exudative AMD LEE- 02.27.19 (W. [facility]) [BCVA OD: 20/40 OS: 20/70-] Ocular Hx- pseudophakia OU, lid sx (Shapiro x 24 yrs ago) PMH- DM, HTN    CURRENT MEDICATIONS: No current outpatient medications on file. (Ophthalmic Drugs)   Current Facility-Administered Medications (Ophthalmic Drugs)  Medication Route  . aflibercept (EYLEA) SOLN 2 mg Intravitreal  . aflibercept (EYLEA) SOLN 2 mg Intravitreal  . aflibercept (EYLEA) SOLN 2 mg Intravitreal  . aflibercept (EYLEA) SOLN 2 mg Intravitreal  . aflibercept (EYLEA) SOLN 2 mg Intravitreal  . aflibercept (EYLEA) SOLN 2 mg Intravitreal   Current Outpatient Medications (Other)  Medication Sig  . acetaminophen (TYLENOL) 500 MG tablet Take 500 mg by mouth every 6 (six) hours as needed. For arthritis pain  . ciprofloxacin (CIPRO) 500 MG tablet Take 1 tablet (500 mg total) by mouth 2 (two) times daily.  Marland Kitchen gabapentin (NEURONTIN) 100 MG capsule Take 100 mg by mouth daily.  . insulin glargine (LANTUS) 100 UNIT/ML injection  Inject 35 Units into the skin daily.   . insulin glargine (LANTUS) 100 UNIT/ML injection   . losartan-hydrochlorothiazide (HYZAAR) 100-25 MG per tablet Take 1 tablet by mouth daily.  . metFORMIN (GLUCOPHAGE) 500 MG tablet Take 500 mg by mouth daily.  . metFORMIN (GLUCOPHAGE-XR) 500 MG 24 hr tablet   . olmesartan (BENICAR) 20 MG tablet Take 20 mg by mouth daily.   Current Facility-Administered Medications (Other)  Medication Route  . Bevacizumab (AVASTIN) SOLN 1.25 mg Intravitreal  . Bevacizumab (AVASTIN) SOLN 1.25 mg Intravitreal  . Bevacizumab (AVASTIN) SOLN 1.25 mg Intravitreal      REVIEW OF SYSTEMS: ROS    Positive for: Eyes   Negative for: Constitutional, Gastrointestinal, Neurological, Skin, Genitourinary, Musculoskeletal, HENT, Endocrine, Cardiovascular, Respiratory, Psychiatric, Allergic/Imm, Heme/Lymph   Last edited by Zenovia Jordan, LPN on 624THL  D34-534 PM. (History)       ALLERGIES No Known Allergies  PAST MEDICAL HISTORY Past Medical History:  Diagnosis Date  . Arthritis   . Back pain 01/30/2015  . Diabetes mellitus without complication ([place])   . Hematuria 12/25/2014  . Hypertension   . PONV (postoperative nausea and vomiting)   . Urinary urgency 12/25/2014  . UTI (lower urinary tract infection) 11/02/2013   Past Surgical History:  Procedure Laterality Date  . ABDOMINAL HYSTERECTOMY  1960s   with bladder tack up  . BREAST SURGERY  benign, right  . C-EYE SURGERY PROCEDURE    . CATARACT EXTRACTION W/PHACO  02/08/2012   Procedure: CATARACT EXTRACTION PHACO AND INTRAOCULAR LENS PLACEMENT (IOC);  Surgeon: Elta Guadeloupe T. Gershon Crane, MD;  Location: [facility];  Service: Ophthalmology;  Laterality: Left;  CDE:11.62  . EYE SURGERY    . RECTOCELE REPAIR  1985   [facility], [place]    FAMILY HISTORY Family History  Problem Relation Age of Onset  . Diabetes Mother   . Diabetes Brother   . Diabetes Brother   . Heart attack Brother   . Cancer Brother        lung  . Cancer  Brother        lung  . Birth defects Brother        passed away at age 71; hole in heart  . Emphysema Brother     SOCIAL HISTORY Social History   Tobacco Use  . Smoking status: Never Smoker  . Smokeless tobacco: Never Used  Substance Use Topics  . Alcohol use: No  . Drug use: No         OPHTHALMIC EXAM:  Base Eye Exam    Visual Acuity (Snellen - Linear)      Right Left   Dist [place] 20/50 20/50   Dist ph [place] NI NI       Tonometry (Tonopen, 1:08 PM)      Right Left   Pressure 12 16       Pupils      Dark Light Shape React APD   Right 3 2 Round Brisk None   Left 3 2 Round Brisk None       Visual Fields (Counting fingers)      Left Right    Full Full       Extraocular Movement      Right Left    Full, Ortho Full, Ortho       Neuro/Psych    Oriented x3: Yes   Mood/Affect: Normal       Dilation    Both eyes: 1.0% Mydriacyl, 2.5% Phenylephrine @ 1:06 PM        Slit Lamp and Fundus Exam    Slit Lamp Exam      Right Left   Lids/Lashes Dermatochalasis - upper lid, Telangiectasia, Ptosis Dermatochalasis - upper lid, Telangiectasia, Meibomian gland dysfunction   Conjunctiva/Sclera White and quiet White and quiet   Cornea Arcus, Inferior 2-3 inferior+ Punctate epithelial erosions Arcus, Inferior 1+ Punctate epithelial erosions   Anterior Chamber Deep and quiet Deep and quiet   [place]ris Patent peripheral iridectomy at 0900, 360 peripapillary Transillumination defects at 0430 and 0800 to 0900 Round and dilated   Lens PC IOL in good position, 1+ Posterior capsular opacification PC IOL in good position, mild PC fold, 1+ non-central Posterior capsular opacification   Vitreous Posterior vitreous detachment Posterior vitreous detachment, Vitreous syneresis       Fundus Exam      Right Left   Disc Temporal Peripapillary atrophy and pigmenation, Pink and Sharp, mild Pallor 360 Peripapillary atrophy, mild Pallor   C/D Ratio 0.2 0.4   Macula Flat, Blunted foveal reflex,  RPE mottling and clumping, Drusen, RPE atrophy nasal to fovea, No heme or edema +CNVM with no edema, Blunted foveal reflex, Drusen, RPE atrophy, mottling and clumping, early sub-retinal fibrosis, No heme or edema   Vessels Vascular attenuation, Tortuousity Vascular attenuation   Periphery Attached, mild peripheral drusen, Reticular degeneration Attached, mild peripheral drusen, mild reticular degeneration  IMAGING AND PROCEDURES  Imaging and Procedures for 08/17/17  OCT, Retina - OU - Both Eyes       Right Eye Quality was good. Central Foveal Thickness: 188. Progression has been stable. Findings include normal foveal contour, no IRF, no SRF, epiretinal membrane, outer retinal atrophy (Thin choroid).   Left Eye Quality was good. Central Foveal Thickness: 208. Progression has been stable. Findings include retinal drusen , subretinal hyper-reflective material, normal foveal contour, no IRF, no SRF, pigment epithelial detachment, outer retinal atrophy, epiretinal membrane (Stable improvement in SRHM/PED -- no overlying fluid, thin choroid).   Notes *Images captured and stored on drive  Diagnosis / Impression:  No DME OU Non-Exudative ARMD OD Exudative ARMD OS with stable improvement in [facility] / PED; significant ORA -- fluid remains resolved  Clinical management:  See below  Abbreviations: NFP - Normal foveal profile. CME - cystoid macular edema. PED - pigment epithelial detachment. IRF - intraretinal fluid. SRF - subretinal fluid. EZ - ellipsoid zone. ERM - epiretinal membrane. ORA - outer retinal atrophy. ORT - outer retinal tubulation. SRHM - subretinal hyper-reflective material         Intravitreal Injection, Pharmacologic Agent - OS - Left Eye       Time Out 04/25/2019. 1:02 PM. Confirmed correct patient, procedure, site, and patient consented.   Anesthesia Topical anesthesia was used. Anesthetic medications included Lidocaine 2%, Proparacaine 0.5%.    Procedure Preparation included 5% betadine to ocular surface, eyelid speculum. A supplied (32 g) needle was used.   Injection:  1.25 mg Bevacizumab (AVASTIN) SOLN   NDC: [id number], Lot: [id number] , Expiration date: 06/19/2019   Route: Intravitreal, Site: Left Eye, Waste: 0 mL  Post-op Post injection exam found visual acuity of at least counting fingers. The patient tolerated the procedure well. There were no complications. The patient received written and verbal post procedure care education.                 ASSESSMENT/PLAN:    ICD-10-CM   1. Exudative age-related macular degeneration of left eye with active choroidal neovascularization (HCC)  H35.3221 Intravitreal Injection, Pharmacologic Agent - OS - Left Eye    Bevacizumab (AVASTIN) SOLN 1.25 mg  2. Intermediate stage nonexudative age-related macular degeneration of right eye  H35.3112   3. Mild nonproliferative diabetic retinopathy of both eyes without macular edema associated with type 2 diabetes mellitus ([place])  [address]   4. Retinal edema  H35.81 OCT, Retina - OU - Both Eyes  5. Posterior vitreous detachment of both eyes  H43.813   6. Pseudophakia of both eyes  Z96.1   7. Entropion of right eyelid  H02.003     1. Exudative age related macular degeneration, OS    - S/P IVA OS #1 (03.05.19), #2 (04.03.19), #3 (05.01.19) -- switched to Eylea due to poor response  - Good Days approved in 2020  - S/P IVE OS #1 (05.29.19), #2 (06.26.19), #3 (07.31.19), #4 (09.11.19), #5 (11.14.19), #6 (01.24.20), #7 (05.06.20), #8 (09.16.20)  - BCVA 20/50 OS  - OCT with stably improved PED/SRHM, no overlying IRF/SRF at 16 wks  - recommend IVA #4 OS today, (01.06.21) for maintenance due to pending benefits investigation for 2021  - pt wishes to proceed  - RBA of procedure discussed, questions answered  - informed consent obtained and signed  - see procedure note  - Eylea4U benefits investigation started 01.06.21  - f/u 12 weeks --  DFE/OCT/possible injxn   2. Age related macular degeneration, non-exudative, OD  -  The incidence, anatomy, and pathology of dry AMD, risk of progression, and the AREDS and AREDS 2 study including smoking risks discussed with patient.  - stable  - continue amsler grid monitoring  3. Mild nonproliferative diabetic retinopathy w/o DME, both eyes  - The incidence, risk factors for progression, natural history and treatment options for diabetic retinopathy were discussed with patient.    - The need for close monitoring of blood glucose, blood pressure, and serum lipids, avoiding cigarette or any type of tobacco, and the need for long term follow up was also discussed with patient.  - exam with rare IRH   - pt unable to cooperate with fluorescencein angiogram  4. No retinal edema on exam or OCT  5. PVD / vitreous syneresis OU  - Discussed findings and prognosis  - No RT or RD on 360 peripheral exam  - Reviewed s/s of RT/RD  - Strict return precautions for any such RT/RD signs/symptoms  6. Pseudophakia OU  - s/p CE/IOL  - beautiful surgey, doing well  - monitor  7. Entropion RLL-   - s/p lower lid sx x 2 by Dr. Gershon Crane  - now s/p repair with Dr. Kristeen Miss  - doing well  - management per Dr. Kristeen Miss  Ophthalmic Meds Ordered this visit:  Meds ordered this encounter  Medications  . Bevacizumab (AVASTIN) SOLN 1.25 mg       Return in about 12 weeks (around 07/18/2019) for f/u exu ARMD OS, DFE, OCT.  There are no Patient Instructions on file for this visit.   Explained the diagnoses, plan, and follow up with the patient and they expressed understanding.  Patient expressed understanding of the importance of proper follow up care.   This document serves as a record of services personally performed by Gardiner Sleeper, MD, PhD. It was created on their behalf by Roselee Nova, COMT. The creation of this record is the provider's dictation and/or activities during the visit.  Electronically  signed by: Roselee Nova, COMT 04/25/19 2:02 PM   This document serves as a record of services personally performed by Gardiner Sleeper, MD, PhD. It was created on their behalf by Ernest Mallick, OA, an ophthalmic assistant. The creation of this record is the provider's dictation and/or activities during the visit.    Electronically signed by: Ernest Mallick, OA 01.06.2021 2:02 PM  Gardiner Sleeper, M.D., Ph.D. Diseases & Surgery of the Retina and [place] 04/25/2019   I have reviewed the above documentation for accuracy and completeness, and I agree with the above. Gardiner Sleeper, M.D., Ph.D. 04/25/19 2:07 PM    Abbreviations: M myopia (nearsighted); A astigmatism; H hyperopia (farsighted); P presbyopia; Mrx spectacle prescription;  CTL contact lenses; OD right eye; OS left eye; OU both eyes  XT exotropia; ET esotropia; PEK punctate epithelial keratitis; PEE punctate epithelial erosions; DES dry eye syndrome; MGD meibomian gland dysfunction; ATs artificial tears; PFAT's preservative free artificial tears; [place] nuclear sclerotic cataract; PSC posterior subcapsular cataract; ERM epi-retinal membrane; PVD posterior vitreous detachment; RD retinal detachment; DM diabetes mellitus; DR diabetic retinopathy; NPDR non-proliferative diabetic retinopathy; PDR proliferative diabetic retinopathy; CSME clinically significant macular edema; DME diabetic macular edema; dbh dot blot hemorrhages; CWS cotton wool spot; POAG primary open angle glaucoma; C/D cup-to-disc ratio; HVF humphrey visual field; GVF goldmann visual field; OCT optical coherence tomography; IOP intraocular pressure; BRVO Branch retinal vein occlusion; CRVO central retinal vein occlusion; CRAO central retinal artery occlusion; BRAO branch retinal  artery occlusion; RT retinal tear; SB scleral buckle; PPV pars plana vitrectomy; VH Vitreous hemorrhage; PRP panretinal laser photocoagulation; IVK intravitreal kenalog; VMT  vitreomacular traction; MH Macular hole;  NVD neovascularization of the disc; NVE neovascularization elsewhere; AREDS age related eye disease study; ARMD age related macular degeneration; POAG primary open angle glaucoma; EBMD epithelial/anterior basement membrane dystrophy; ACIOL anterior chamber intraocular lens; IOL intraocular lens; PCIOL posterior chamber intraocular lens; Phaco/IOL phacoemulsification with intraocular lens placement; [place] photorefractive keratectomy; LASIK laser assisted in situ keratomileusis; HTN hypertension; DM diabetes mellitus; COPD chronic obstructive pulmonary disease

## 2019-04-25 ENCOUNTER — Encounter (INDEPENDENT_AMBULATORY_CARE_PROVIDER_SITE_OTHER): Payer: Self-pay | Admitting: Ophthalmology

## 2019-04-25 ENCOUNTER — Ambulatory Visit (INDEPENDENT_AMBULATORY_CARE_PROVIDER_SITE_OTHER): Payer: Medicare Other | Admitting: Ophthalmology

## 2019-04-25 DIAGNOSIS — H43813 Vitreous degeneration, bilateral: Secondary | ICD-10-CM | POA: Diagnosis not present

## 2019-04-25 DIAGNOSIS — H353221 Exudative age-related macular degeneration, left eye, with active choroidal neovascularization: Secondary | ICD-10-CM | POA: Diagnosis not present

## 2019-04-25 DIAGNOSIS — E113293 Type 2 diabetes mellitus with mild nonproliferative diabetic retinopathy without macular edema, bilateral: Secondary | ICD-10-CM

## 2019-04-25 DIAGNOSIS — H3581 Retinal edema: Secondary | ICD-10-CM

## 2019-04-25 DIAGNOSIS — H353112 Nonexudative age-related macular degeneration, right eye, intermediate dry stage: Secondary | ICD-10-CM

## 2019-04-25 DIAGNOSIS — H02003 Unspecified entropion of right eye, unspecified eyelid: Secondary | ICD-10-CM

## 2019-04-25 DIAGNOSIS — Z961 Presence of intraocular lens: Secondary | ICD-10-CM

## 2019-04-25 MED ORDER — BEVACIZUMAB CHEMO INJECTION 1.25MG/0.05ML SYRINGE FOR KALEIDOSCOPE
1.2500 mg | INTRAVITREAL | Status: AC | PRN
  Administered 2019-04-25: 1.25 mg via INTRAVITREAL

## 2019-04-26 DIAGNOSIS — E1140 Type 2 diabetes mellitus with diabetic neuropathy, unspecified: Secondary | ICD-10-CM | POA: Diagnosis not present

## 2019-04-26 DIAGNOSIS — M79671 Pain in right foot: Secondary | ICD-10-CM | POA: Diagnosis not present

## 2019-04-26 DIAGNOSIS — E1151 Type 2 diabetes mellitus with diabetic peripheral angiopathy without gangrene: Secondary | ICD-10-CM | POA: Diagnosis not present

## 2019-04-26 DIAGNOSIS — M25579 Pain in unspecified ankle and joints of unspecified foot: Secondary | ICD-10-CM | POA: Diagnosis not present

## 2019-04-26 DIAGNOSIS — M79672 Pain in left foot: Secondary | ICD-10-CM | POA: Diagnosis not present

## 2019-05-08 ENCOUNTER — Other Ambulatory Visit: Payer: Self-pay

## 2019-05-08 ENCOUNTER — Telehealth: Payer: Self-pay | Admitting: *Deleted

## 2019-05-08 ENCOUNTER — Other Ambulatory Visit (INDEPENDENT_AMBULATORY_CARE_PROVIDER_SITE_OTHER): Payer: Medicare Other

## 2019-05-08 DIAGNOSIS — Z87440 Personal history of urinary (tract) infections: Secondary | ICD-10-CM | POA: Diagnosis not present

## 2019-05-08 LAB — POCT URINALYSIS DIPSTICK
Glucose, UA: NEGATIVE
Ketones, UA: NEGATIVE
Leukocytes, UA: NEGATIVE
Nitrite, UA: POSITIVE
Protein, UA: NEGATIVE

## 2019-05-08 NOTE — Telephone Encounter (Signed)
Patient left message requesting to bring in urine specimen for a possible bladder infection.

## 2019-05-08 NOTE — Progress Notes (Signed)
Pt dropped off urine sample. Positive for nit. Will send urine to lab for culture. [place]

## 2019-05-09 DIAGNOSIS — N390 Urinary tract infection, site not specified: Secondary | ICD-10-CM | POA: Diagnosis not present

## 2019-05-09 DIAGNOSIS — Z87440 Personal history of urinary (tract) infections: Secondary | ICD-10-CM | POA: Diagnosis not present

## 2019-05-10 ENCOUNTER — Telehealth: Payer: Self-pay | Admitting: *Deleted

## 2019-05-10 LAB — URINALYSIS, ROUTINE W REFLEX MICROSCOPIC
Bilirubin, UA: NEGATIVE
Glucose, UA: NEGATIVE
Ketones, UA: NEGATIVE
Nitrite, UA: POSITIVE — AB
Protein,UA: NEGATIVE
Specific Gravity, UA: 1.019 (ref 1.005–1.030)
Urobilinogen, Ur: 0.2 mg/dL (ref 0.2–1.0)
pH, UA: 5.5 (ref 5.0–7.5)

## 2019-05-10 LAB — MICROSCOPIC EXAMINATION
Casts: NONE SEEN /lpf
WBC, UA: >30 /hpf — AB (ref 0–5)

## 2019-05-10 NOTE — Telephone Encounter (Signed)
Patient requesting results from urine culture.  Upon review of the chart, it looks as though it dipped positive for nitrates and was sent for culture which is still pending.  Can an antibiotic be sent in for her?

## 2019-05-11 LAB — URINE CULTURE

## 2019-05-11 MED ORDER — CEPHALEXIN 500 MG PO CAPS: 500.0000 mg | ORAL_CAPSULE | Freq: Three times a day (TID) | ORAL | 0 refills | Status: DC

## 2019-05-11 MED ORDER — CIPROFLOXACIN HCL 500 MG PO TABS: 500.0000 mg | ORAL_TABLET | Freq: Two times a day (BID) | ORAL | 0 refills | Status: DC

## 2019-05-11 NOTE — Telephone Encounter (Signed)
Pt aware that urine showing E Coli, will rx keflex.

## 2019-05-11 NOTE — Addendum Note (Signed)
Addended by: Derrek Monaco A on: 05/11/2019 08:36 AM   Modules accepted: Orders

## 2019-05-15 ENCOUNTER — Telehealth: Payer: Self-pay | Admitting: Adult Health

## 2019-05-15 NOTE — Telephone Encounter (Signed)
Pt says she is feeling much better, she is aware urine + E coli, so finish keflex

## 2019-07-18 ENCOUNTER — Encounter (INDEPENDENT_AMBULATORY_CARE_PROVIDER_SITE_OTHER): Payer: Medicare Other | Admitting: Ophthalmology

## 2019-07-27 NOTE — Progress Notes (Signed)
[facility] Note  07/30/2019     CHIEF COMPLAINT Patient presents for Retina Follow Up   HISTORY OF PRESENT ILLNESS: [name] is a 86 y.o. female who presents to the clinic today for:   HPI    Retina Follow Up    Patient presents with  Wet AMD.  In left eye.  This started years ago.  Severity is moderate.  Duration of 14 weeks.  Since onset it is stable.  I, the attending physician,  performed the HPI with the patient and updated documentation appropriately.          Comments    86 y/o female pt here for 14 wk f/u for exu ARMD OS.  Was supposed to return in 12 wks, but f/u was lost due to pt having many allergy issues.  No change in [place] OU.  Denies pain, FOL, floaters.  No gtts.       Last edited by [name], MD on 07/30/2019  8:11 PM. (History)    pt states she is doing well, she has not noticed a change in vision, she states she has allergies in her eyes and they are watery, she is not using any drops   Referring physician: [name], MD [address] [place],  [place] [address]  HISTORICAL INFORMATION:   Selected notes from the MEDICAL RECORD NUMBER Referred by Dr. [name] for concern of exudative AMD LEE- 02.27.19 (W. [facility]) [BCVA OD: 20/40 OS: 20/70-] Ocular Hx- pseudophakia OU, lid sx (Shapiro x 24 yrs ago) PMH- DM, HTN    CURRENT MEDICATIONS: No current outpatient medications on file. (Ophthalmic Drugs)   Current Facility-Administered Medications (Ophthalmic Drugs)  Medication Route  . aflibercept (EYLEA) SOLN 2 mg Intravitreal  . aflibercept (EYLEA) SOLN 2 mg Intravitreal  . aflibercept (EYLEA) SOLN 2 mg Intravitreal  . aflibercept (EYLEA) SOLN 2 mg Intravitreal  . aflibercept (EYLEA) SOLN 2 mg Intravitreal  . aflibercept (EYLEA) SOLN 2 mg Intravitreal   Current Outpatient Medications (Other)  Medication Sig  . acetaminophen (TYLENOL) 500 MG tablet Take 500 mg by mouth every 6 (six) hours as needed. For  arthritis pain  . cephALEXin (KEFLEX) 500 MG capsule Take 1 capsule (500 mg total) by mouth 3 (three) times daily.  Marland Kitchen gabapentin (NEURONTIN) 100 MG capsule Take 100 mg by mouth daily.  . hydrochlorothiazide (HYDRODIURIL) 25 MG tablet Take 25 mg by mouth daily.  . insulin glargine (LANTUS) 100 UNIT/ML injection Inject 35 Units into the skin daily.   . insulin glargine (LANTUS) 100 UNIT/ML injection   . losartan (COZAAR) 100 MG tablet Take 100 mg by mouth daily.  Marland Kitchen losartan-hydrochlorothiazide (HYZAAR) 100-25 MG per tablet Take 1 tablet by mouth daily.  . metFORMIN (GLUCOPHAGE) 500 MG tablet Take 500 mg by mouth daily.  . metFORMIN (GLUCOPHAGE-XR) 500 MG 24 hr tablet   . olmesartan (BENICAR) 20 MG tablet Take 20 mg by mouth daily.  Haig Prophet COMFORT INS SYR .5CC/30G 30G X 5/16" 0.5 ML MISC USE AS DIRECTED WITHOLANTUS.   Current Facility-Administered Medications (Other)  Medication Route  . Bevacizumab (AVASTIN) SOLN 1.25 mg Intravitreal  . Bevacizumab (AVASTIN) SOLN 1.25 mg Intravitreal  . Bevacizumab (AVASTIN) SOLN 1.25 mg Intravitreal      REVIEW OF SYSTEMS: ROS    Positive for: Eyes   Negative for: Constitutional, Gastrointestinal, Neurological, Skin, Genitourinary, Musculoskeletal, HENT, Endocrine, Cardiovascular, Respiratory, Psychiatric, Allergic/Imm, Heme/Lymph   Last edited by Matthew Folks, COA  on 07/30/2019  3:02 PM. (History)       ALLERGIES No Known Allergies  PAST MEDICAL HISTORY Past Medical History:  Diagnosis Date  . Arthritis   . Back pain 01/30/2015  . Diabetes mellitus without complication ([place])   . Diabetic retinopathy ([place])    NPDR OU  . Hematuria 12/25/2014  . Hypertension   . Macular degeneration    Dry OD, Wet OS  . PONV (postoperative nausea and vomiting)   . Urinary urgency 12/25/2014  . UTI (lower urinary tract infection) 11/02/2013   Past Surgical History:  Procedure Laterality Date  . ABDOMINAL HYSTERECTOMY  1960s   with bladder tack up  .  BREAST SURGERY     benign, right  . C-EYE SURGERY PROCEDURE    . CATARACT EXTRACTION W/PHACO  02/08/2012   Procedure: CATARACT EXTRACTION PHACO AND INTRAOCULAR LENS PLACEMENT (IOC);  Surgeon: Elta Guadeloupe T. Gershon Crane, MD;  Location: [facility];  Service: Ophthalmology;  Laterality: Left;  CDE:11.62  . EYE SURGERY Bilateral    Cat Sx  . RECTOCELE REPAIR  1985   [facility], [place]    FAMILY HISTORY Family History  Problem Relation Age of Onset  . Diabetes Mother   . Diabetes Brother   . Diabetes Brother   . Heart attack Brother   . Cancer Brother        lung  . Cancer Brother        lung  . Birth defects Brother        passed away at age 93; hole in heart  . Emphysema Brother     SOCIAL HISTORY Social History   Tobacco Use  . Smoking status: Never Smoker  . Smokeless tobacco: Never Used  Substance Use Topics  . Alcohol use: No  . Drug use: No         OPHTHALMIC EXAM:  Base Eye Exam    Visual Acuity (Snellen - Linear)      Right Left   Dist [place] 20/50 20/50   Dist ph  NI NI       Tonometry (Tonopen, 3:05 PM)      Right Left   Pressure 14 15       Pupils      Dark Light Shape React APD   Right 3 2 Round Brisk None   Left 3 2 Round Brisk None       Visual Fields (Counting fingers)      Left Right    Full Full       Extraocular Movement      Right Left    Full, Ortho Full, Ortho       Neuro/Psych    Oriented x3: Yes   Mood/Affect: Normal       Dilation    Both eyes: 1.0% Mydriacyl, 2.5% Phenylephrine @ 3:05 PM        Slit Lamp and Fundus Exam    Slit Lamp Exam      Right Left   Lids/Lashes Dermatochalasis - upper lid, Telangiectasia, Ptosis Dermatochalasis - upper lid, Telangiectasia, Meibomian gland dysfunction   Conjunctiva/Sclera White and quiet White and quiet   Cornea Arcus, Inferior 2-3 inferior+ Punctate epithelial erosions Arcus, Inferior 1+ Punctate epithelial erosions   Anterior Chamber Deep and quiet Deep and quiet   [place] Patent peripheral  iridectomy at 0900, 360 peripapillary Transillumination defects at 0430 and 0800 to 0900 Round and dilated   Lens PC IOL in good position, 1+ Posterior capsular opacification PC IOL in good  position, mild PC fold, 1+ non-central Posterior capsular opacification   Vitreous Posterior vitreous detachment Posterior vitreous detachment, Vitreous syneresis       Fundus Exam      Right Left   Disc Temporal Peripapillary atrophy and pigmenation, Sharp rim, mild Pallor 360 Peripapillary atrophy, mild Pallor   C/D Ratio 0.2 0.4   Macula Flat, Blunted foveal reflex, RPE mottling and clumping, Drusen, RPE atrophy nasal to fovea, No heme or edema +CNVM with no edema, Blunted foveal reflex, Drusen, RPE atrophy, mottling and clumping, early sub-retinal fibrosis, No heme or edema   Vessels Vascular attenuation, Tortuousity Vascular attenuation   Periphery Attached, mild peripheral drusen, Reticular degeneration Attached, mild peripheral drusen, mild reticular degeneration          IMAGING AND PROCEDURES  Imaging and Procedures for 08/17/17  OCT, Retina - OU - Both Eyes       Right Eye Quality was good. Central Foveal Thickness: 186. Progression has been stable. Findings include normal foveal contour, no IRF, no SRF, epiretinal membrane, outer retinal atrophy, retinal drusen  (Thin choroid; focal ORA nasal macula).   Left Eye Quality was good. Central Foveal Thickness: 208. Progression has been stable. Findings include retinal drusen , subretinal hyper-reflective material, normal foveal contour, no IRF, no SRF, pigment epithelial detachment, outer retinal atrophy, epiretinal membrane (Stable improvement in SRHM/PED -- no overlying fluid, thin choroid).   Notes *Images captured and stored on drive  Diagnosis / Impression:  No DME OU Non-Exudative ARMD OD Exudative ARMD OS with stable improvement in [facility] / PED; significant ORA -- fluid remains resolved  Clinical management:  See  below  Abbreviations: NFP - Normal foveal profile. CME - cystoid macular edema. PED - pigment epithelial detachment. IRF - intraretinal fluid. SRF - subretinal fluid. EZ - ellipsoid zone. ERM - epiretinal membrane. ORA - outer retinal atrophy. ORT - outer retinal tubulation. SRHM - subretinal hyper-reflective material         Intravitreal Injection, Pharmacologic Agent - OS - Left Eye       Time Out 07/30/2019. 2:59 PM. Confirmed correct patient, procedure, site, and patient consented.   Anesthesia Topical anesthesia was used. Anesthetic medications included Lidocaine 2%, Proparacaine 0.5%.   Procedure Preparation included eyelid speculum, 5% betadine to ocular surface. A (32g) needle was used.   Injection:  2 mg aflibercept Alfonse Flavors) SOLN   NDC: O5083423, Lot: [id number], Expiration date: 01/14/2020   Route: Intravitreal, Site: Left Eye, Waste: 0.05 mL  Post-op Post injection exam found visual acuity of at least counting fingers. The patient tolerated the procedure well. There were no complications. The patient received written and verbal post procedure care education.                 ASSESSMENT/PLAN:    ICD-10-CM   1. Exudative age-related macular degeneration of left eye with active choroidal neovascularization (HCC)  H35.3221 Intravitreal Injection, Pharmacologic Agent - OS - Left Eye    aflibercept (EYLEA) SOLN 2 mg  2. Intermediate stage nonexudative age-related macular degeneration of right eye  H35.3112   3. Mild nonproliferative diabetic retinopathy of both eyes without macular edema associated with type 2 diabetes mellitus ([place])  [id number]   4. Retinal edema  H35.81 OCT, Retina - OU - Both Eyes  5. Posterior vitreous detachment of both eyes  H43.813   6. Pseudophakia of both eyes  Z96.1     1. Exudative age related macular degeneration, OS    - S/P IVA OS #1 (  03.05.19), #2 (04.03.19), #3 (05.01.19), #4 (01.06.21) -- switched to Eylea due to poor response  -  Good Days approved in 2020  - S/P IVE OS #1 (05.29.19), #2 (06.26.19), #3 (07.31.19), #4 (09.11.19), #5 (11.14.19), #6 (01.24.20), #7 (05.06.20), #8 (09.16.20)  - BCVA 20/50 OS  - OCT with stably improved PED/SRHM, no overlying IRF/SRF at 12 wks post-IVA  - recommend IVE #9 OS today, (04.12.21) -- Eylea benefits approved for 2021 -- maintenance  - pt wishes to proceed  - RBA of procedure discussed, questions answered  - informed consent obtained   - Eylea informed consent form signed and scanned on 09.16.2020  - see procedure note  - Eylea4U benefits investigation started 01.06.21 -- approved for 2021  - f/u 16 weeks -- DFE/OCT/possible injxn   2. Age related macular degeneration, non-exudative, OD  - The incidence, anatomy, and pathology of dry AMD, risk of progression, and the AREDS and AREDS 2 study including smoking risks discussed with patient.  - stable  - continue amsler grid monitoring  3. Mild nonproliferative diabetic retinopathy w/o DME, both eyes  - The incidence, risk factors for progression, natural history and treatment options for diabetic retinopathy were discussed with patient.    - The need for close monitoring of blood glucose, blood pressure, and serum lipids, avoiding cigarette or any type of tobacco, and the need for long term follow up was also discussed with patient.  - exam with rare IRH   - pt unable to cooperate with fluorescencein angiogram  4. No retinal edema on exam or OCT  5. PVD / vitreous syneresis OU  - Discussed findings and prognosis  - No RT or RD on 360 peripheral exam  - Reviewed s/s of RT/RD  - Strict return precautions for any such RT/RD signs/symptoms  6. Pseudophakia OU  - s/p CE/IOL  - beautiful surgey, doing well  - monitor   Ophthalmic Meds Ordered this visit:  Meds ordered this encounter  Medications  . aflibercept (EYLEA) SOLN 2 mg       Return in about 16 weeks (around 11/19/2019) for f/u exu ARMD OS, DFE, OCT.  There  are no Patient Instructions on file for this visit.   Explained the diagnoses, plan, and follow up with the patient and they expressed understanding.  Patient expressed understanding of the importance of proper follow up care.   This document serves as a record of services personally performed by Gardiner Sleeper, MD, PhD. It was created on their behalf by Leeann Must, [place], a certified ophthalmic assistant. The creation of this record is the provider's dictation and/or activities during the visit.    Electronically signed by: Leeann Must, COA @TODAY @ 8:15 PM  Gardiner Sleeper, M.D., Ph.D. Diseases & Surgery of the Retina and Vitreous [facility]  I have reviewed the above documentation for accuracy and completeness, and I agree with the above. Gardiner Sleeper, M.D., Ph.D. 07/30/19 8:15 PM    Abbreviations: M myopia (nearsighted); A astigmatism; H hyperopia (farsighted); P presbyopia; Mrx spectacle prescription;  CTL contact lenses; OD right eye; OS left eye; OU both eyes  XT exotropia; ET esotropia; PEK punctate epithelial keratitis; PEE punctate epithelial erosions; DES dry eye syndrome; MGD meibomian gland dysfunction; ATs artificial tears; PFAT's preservative free artificial tears; [place] nuclear sclerotic cataract; PSC posterior subcapsular cataract; ERM epi-retinal membrane; PVD posterior vitreous detachment; RD retinal detachment; DM diabetes mellitus; DR diabetic retinopathy; NPDR non-proliferative diabetic retinopathy; PDR proliferative diabetic  retinopathy; CSME clinically significant macular edema; DME diabetic macular edema; dbh dot blot hemorrhages; CWS cotton wool spot; POAG primary open angle glaucoma; C/D cup-to-disc ratio; HVF humphrey visual field; GVF goldmann visual field; OCT optical coherence tomography; IOP intraocular pressure; BRVO Branch retinal vein occlusion; CRVO central retinal vein occlusion; CRAO central retinal artery occlusion; BRAO branch  retinal artery occlusion; RT retinal tear; SB scleral buckle; PPV pars plana vitrectomy; VH Vitreous hemorrhage; PRP panretinal laser photocoagulation; IVK intravitreal kenalog; VMT vitreomacular traction; MH Macular hole;  NVD neovascularization of the disc; NVE neovascularization elsewhere; AREDS age related eye disease study; ARMD age related macular degeneration; POAG primary open angle glaucoma; EBMD epithelial/anterior basement membrane dystrophy; ACIOL anterior chamber intraocular lens; IOL intraocular lens; PCIOL posterior chamber intraocular lens; Phaco/IOL phacoemulsification with intraocular lens placement; [place] photorefractive keratectomy; LASIK laser assisted in situ keratomileusis; HTN hypertension; DM diabetes mellitus; COPD chronic obstructive pulmonary disease

## 2019-07-30 ENCOUNTER — Ambulatory Visit (INDEPENDENT_AMBULATORY_CARE_PROVIDER_SITE_OTHER): Payer: Medicare Other | Admitting: Ophthalmology

## 2019-07-30 ENCOUNTER — Encounter (INDEPENDENT_AMBULATORY_CARE_PROVIDER_SITE_OTHER): Payer: Self-pay | Admitting: Ophthalmology

## 2019-07-30 DIAGNOSIS — H3581 Retinal edema: Secondary | ICD-10-CM

## 2019-07-30 DIAGNOSIS — Z961 Presence of intraocular lens: Secondary | ICD-10-CM

## 2019-07-30 DIAGNOSIS — E113293 Type 2 diabetes mellitus with mild nonproliferative diabetic retinopathy without macular edema, bilateral: Secondary | ICD-10-CM

## 2019-07-30 DIAGNOSIS — H353112 Nonexudative age-related macular degeneration, right eye, intermediate dry stage: Secondary | ICD-10-CM

## 2019-07-30 DIAGNOSIS — H43813 Vitreous degeneration, bilateral: Secondary | ICD-10-CM

## 2019-07-30 DIAGNOSIS — H353221 Exudative age-related macular degeneration, left eye, with active choroidal neovascularization: Secondary | ICD-10-CM

## 2019-07-30 MED ORDER — AFLIBERCEPT 2MG/0.05ML IZ SOLN FOR KALEIDOSCOPE
2.0000 mg | INTRAVITREAL | Status: AC | PRN
  Administered 2019-07-30: 2 mg via INTRAVITREAL

## 2019-08-27 DIAGNOSIS — N390 Urinary tract infection, site not specified: Secondary | ICD-10-CM | POA: Diagnosis not present

## 2019-10-02 DIAGNOSIS — H524 Presbyopia: Secondary | ICD-10-CM | POA: Diagnosis not present

## 2019-10-02 DIAGNOSIS — H43813 Vitreous degeneration, bilateral: Secondary | ICD-10-CM | POA: Diagnosis not present

## 2019-10-29 DIAGNOSIS — E1129 Type 2 diabetes mellitus with other diabetic kidney complication: Secondary | ICD-10-CM | POA: Diagnosis not present

## 2019-10-29 DIAGNOSIS — Z79899 Other long term (current) drug therapy: Secondary | ICD-10-CM | POA: Diagnosis not present

## 2019-10-29 DIAGNOSIS — I10 Essential (primary) hypertension: Secondary | ICD-10-CM | POA: Diagnosis not present

## 2019-10-29 DIAGNOSIS — N183 Chronic kidney disease, stage 3 unspecified: Secondary | ICD-10-CM | POA: Diagnosis not present

## 2019-11-05 DIAGNOSIS — N390 Urinary tract infection, site not specified: Secondary | ICD-10-CM | POA: Diagnosis not present

## 2019-11-05 DIAGNOSIS — N1832 Chronic kidney disease, stage 3b: Secondary | ICD-10-CM | POA: Diagnosis not present

## 2019-11-05 DIAGNOSIS — E1122 Type 2 diabetes mellitus with diabetic chronic kidney disease: Secondary | ICD-10-CM | POA: Diagnosis not present

## 2019-11-28 NOTE — Progress Notes (Addendum)
[facility] Note  11/30/2019     CHIEF COMPLAINT Patient presents for Retina Follow Up   HISTORY OF PRESENT ILLNESS: [name] is a 86 y.o. female who presents to the clinic today for:   HPI    Retina Follow Up    Patient presents with  Wet AMD.  In left eye.  Duration of 4 months.  Since onset it is stable.  I, the attending physician,  performed the HPI with the patient and updated documentation appropriately.          Comments    4 month follow up NVAMD OS-  Doing well, denies any new problems.  Vision appears stable OU.  Uses Refresh prn.  BS: 112 this am, A1C: 7.4       Last edited by [name], MD on 11/30/2019  1:32 PM. (History)    pt states her eyes have been doing well, pt is using Refresh for dryness as needed  Referring physician: Particia Nearing, [place]. [address],  [place] [address]  HISTORICAL INFORMATION:   Selected notes from the MEDICAL RECORD NUMBER Referred by Dr. [name] for concern of exudative AMD LEE- 02.27.19 (W. [facility]) [BCVA OD: 20/40 OS: 20/70-] Ocular Hx- pseudophakia OU, lid sx (Shapiro x 24 yrs ago) PMH- DM, HTN    CURRENT MEDICATIONS: No current outpatient medications on file. (Ophthalmic Drugs)   Current Facility-Administered Medications (Ophthalmic Drugs)  Medication Route   aflibercept (EYLEA) SOLN 2 mg Intravitreal   aflibercept (EYLEA) SOLN 2 mg Intravitreal   aflibercept (EYLEA) SOLN 2 mg Intravitreal   aflibercept (EYLEA) SOLN 2 mg Intravitreal   aflibercept (EYLEA) SOLN 2 mg Intravitreal   aflibercept (EYLEA) SOLN 2 mg Intravitreal   Current Outpatient Medications (Other)  Medication Sig   acetaminophen (TYLENOL) 500 MG tablet Take 500 mg by mouth every 6 (six) hours as needed. For arthritis pain   cephALEXin (KEFLEX) 500 MG capsule Take 1 capsule (500 mg total) by mouth 3 (three) times daily.   gabapentin (NEURONTIN) 100 MG capsule Take 100 mg by mouth daily.    hydrochlorothiazide (HYDRODIURIL) 25 MG tablet Take 25 mg by mouth daily.   insulin glargine (LANTUS) 100 UNIT/ML injection Inject 35 Units into the skin daily.    insulin glargine (LANTUS) 100 UNIT/ML injection    losartan (COZAAR) 100 MG tablet Take 100 mg by mouth daily.   losartan-hydrochlorothiazide (HYZAAR) 100-25 MG per tablet Take 1 tablet by mouth daily.   metFORMIN (GLUCOPHAGE) 500 MG tablet Take 500 mg by mouth daily.   metFORMIN (GLUCOPHAGE-XR) 500 MG 24 hr tablet    olmesartan (BENICAR) 20 MG tablet Take 20 mg by mouth daily.   SURE COMFORT INS SYR .5CC/30G 30G X 5/16" 0.5 ML MISC USE AS DIRECTED WITHOLANTUS.   Current Facility-Administered Medications (Other)  Medication Route   Bevacizumab (AVASTIN) SOLN 1.25 mg Intravitreal   Bevacizumab (AVASTIN) SOLN 1.25 mg Intravitreal   Bevacizumab (AVASTIN) SOLN 1.25 mg Intravitreal      REVIEW OF SYSTEMS: ROS    Positive for: Musculoskeletal, Endocrine, Eyes   Negative for: Constitutional, Gastrointestinal, Neurological, Skin, Genitourinary, HENT, Cardiovascular, Respiratory, Psychiatric, Allergic/Imm, Heme/Lymph   Last edited by Leonie Douglas, COA on 11/30/2019  1:07 PM. (History)       ALLERGIES No Known Allergies  PAST MEDICAL HISTORY Past Medical History:  Diagnosis Date   Arthritis    Back pain 01/30/2015   Diabetes mellitus without complication ([place])  Diabetic retinopathy ([place])    NPDR OU   Hematuria 12/25/2014   Hypertension    Macular degeneration    Dry OD, Wet OS   PONV (postoperative nausea and vomiting)    Urinary urgency 12/25/2014   UTI (lower urinary tract infection) 11/02/2013   Past Surgical History:  Procedure Laterality Date   ABDOMINAL HYSTERECTOMY  1960s   with bladder tack up   BREAST SURGERY     benign, right   C-EYE SURGERY PROCEDURE     CATARACT EXTRACTION W/PHACO  02/08/2012   Procedure: CATARACT EXTRACTION PHACO AND INTRAOCULAR LENS PLACEMENT ([place]);   Surgeon: Elta Guadeloupe T. Gershon Crane, MD;  Location: [facility];  Service: Ophthalmology;  Laterality: Left;  CDE:11.62   EYE SURGERY Bilateral    Cat Sx   RECTOCELE REPAIR  1985   [facility], [place]    FAMILY HISTORY Family History  Problem Relation Age of Onset   Diabetes Mother    Diabetes Brother    Diabetes Brother    Heart attack Brother    Cancer Brother        lung   Cancer Brother        lung   Birth defects Brother        passed away at age 79; hole in heart   Emphysema Brother     SOCIAL HISTORY Social History   Tobacco Use   Smoking status: Never Smoker   Smokeless tobacco: Never Used  Scientific laboratory technician Use: Never used  Substance Use Topics   Alcohol use: No   Drug use: No         OPHTHALMIC EXAM:  Base Eye Exam    Visual Acuity (Snellen - Linear)      Right Left   Dist [place] 20/50 20/50   Dist ph [place] 20/50 +2 NI       Tonometry (Tonopen, 1:12 PM)      Right Left   Pressure 12 13       Pupils      Dark Light Shape React APD   Right 3 2 Round Brisk None   Left 3 2 Round Brisk None       Visual Fields (Counting fingers)      Left Right    Full Full       Extraocular Movement      Right Left    Full Full       Neuro/Psych    Oriented x3: Yes   Mood/Affect: Normal       Dilation    Both eyes: 1.0% Mydriacyl, 2.5% Phenylephrine @ 1:13 PM        Slit Lamp and Fundus Exam    Slit Lamp Exam      Right Left   Lids/Lashes Dermatochalasis - upper lid, Telangiectasia, Ptosis Dermatochalasis - upper lid, Telangiectasia, Meibomian gland dysfunction   Conjunctiva/Sclera White and quiet White and quiet   Cornea Arcus, Inferior 2-3 inferior+ Punctate epithelial erosions Arcus, Inferior 1+ Punctate epithelial erosions   Anterior Chamber Deep and quiet Deep and quiet   [place] Patent peripheral iridectomy at 0900, 360 peripapillary Transillumination defects at 0430 and 0800 to 0900 Round and dilated   Lens PC IOL in good position, 1+ Posterior  capsular opacification PC IOL in good position, mild PC fold, 1+ non-central Posterior capsular opacification   Vitreous Posterior vitreous detachment Posterior vitreous detachment, Vitreous syneresis       Fundus Exam      Right Left  Disc Temporal Peripapillary atrophy and pigmenation, Sharp rim, mild Pallor 360 Peripapillary atrophy, mild Pallor   C/D Ratio 0.2 0.4   Macula Flat, Blunted foveal reflex, RPE mottling and clumping, Drusen, RPE atrophy nasal to fovea, No heme or edema +CNVM with no edema, Blunted foveal reflex, Drusen, RPE atrophy, mottling and clumping, early sub-retinal fibrosis, No heme or edema   Vessels Vascular attenuation, Tortuousity Vascular attenuation   Periphery Attached, mild peripheral drusen, Reticular degeneration Attached, mild peripheral drusen, mild reticular degeneration          IMAGING AND PROCEDURES  Imaging and Procedures for 08/17/17  OCT, Retina - OU - Both Eyes       Right Eye Quality was good. Central Foveal Thickness: 187. Progression has been stable. Findings include normal foveal contour, no IRF, no SRF, epiretinal membrane, outer retinal atrophy, retinal drusen  (Thin choroid; focal ORA nasal macula).   Left Eye Quality was good. Central Foveal Thickness: 211. Progression has been stable. Findings include retinal drusen , subretinal hyper-reflective material, normal foveal contour, no IRF, no SRF, pigment epithelial detachment, outer retinal atrophy, epiretinal membrane (Persistent SRHM/PED -- no overlying fluid, thin choroid).   Notes *Images captured and stored on drive  Diagnosis / Impression:  No DME OU Non-Exudative ARMD OD Exudative ARMD OS with persistent SRHM / PED; significant ORA -- fluid remains resolved, +trace cystic changes  Clinical management:  See below  Abbreviations: NFP - Normal foveal profile. CME - cystoid macular edema. PED - pigment epithelial detachment. IRF - intraretinal fluid. SRF - subretinal fluid.  EZ - ellipsoid zone. ERM - epiretinal membrane. ORA - outer retinal atrophy. ORT - outer retinal tubulation. SRHM - subretinal hyper-reflective material         Intravitreal Injection, Pharmacologic Agent - OS - Left Eye       Time Out 11/30/2019. 1:17 PM. Confirmed correct patient, procedure, site, and patient consented.   Anesthesia Topical anesthesia was used. Anesthetic medications included Lidocaine 2%, Proparacaine 0.5%.   Procedure Preparation included eyelid speculum, 5% betadine to ocular surface. A (32g) needle was used.   Injection:  2 mg aflibercept Alfonse Flavors) SOLN   NDC: M7179715, Lot: [id number], Expiration date: 01/18/2020   Route: Intravitreal, Site: Left Eye, Waste: 0.05 mL  Post-op Post injection exam found visual acuity of at least counting fingers. The patient tolerated the procedure well. There were no complications. The patient received written and verbal post procedure care education.                 ASSESSMENT/PLAN:    ICD-10-CM   1. Exudative age-related macular degeneration of left eye with active choroidal neovascularization (HCC)  H35.3221 Intravitreal Injection, Pharmacologic Agent - OS - Left Eye    aflibercept (EYLEA) SOLN 2 mg  2. Intermediate stage nonexudative age-related macular degeneration of right eye  H35.3112   3. Mild nonproliferative diabetic retinopathy of both eyes without macular edema associated with type 2 diabetes mellitus ([place])  O16.0737   4. Retinal edema  H35.81 OCT, Retina - OU - Both Eyes  5. Posterior vitreous detachment of both eyes  H43.813   6. Pseudophakia of both eyes  Z96.1     1. Exudative age related macular degeneration, OS    - S/P IVA OS #1 (03.05.19), #2 (04.03.19), #3 (05.01.19), #4 (01.06.21) -- switched to Eylea due to poor response  - Good Days approved in 2020  - S/P IVE OS #1 (05.29.19), #2 (06.26.19), #3 (07.31.19), #4 (09.11.19), #5 (11.14.19), #6 (  01.24.20), #7 (05.06.20), #8 (09.16.20), #9  (04.12.21)  - BCVA 20/50 OS  - OCT with stably improved PED/SRHM, no overlying IRF/SRF at 16 wks post-IVA  - recommend IVE #10 OS today, (08.13.21) -- Eylea benefits approved for 2021 -- maintenance  - pt wishes to proceed  - RBA of procedure discussed, questions answered  - informed consent obtained   - Eylea informed consent form signed and scanned on 09.16.2020  - see procedure note  - Eylea4U benefits investigation started 01.06.21 -- approved for 2021  - f/u 16 weeks -- DFE/OCT/possible injxn   2. Age related macular degeneration, non-exudative, OD  - The incidence, anatomy, and pathology of dry AMD, risk of progression, and the AREDS and AREDS 2 study including smoking risks discussed with patient.  - stable  - continue amsler grid monitoring  3. Mild nonproliferative diabetic retinopathy w/o DME, both eyes  - The incidence, risk factors for progression, natural history and treatment options for diabetic retinopathy were discussed with patient.    - The need for close monitoring of blood glucose, blood pressure, and serum lipids, avoiding cigarette or any type of tobacco, and the need for long term follow up was also discussed with patient.  - exam with rare IRH   - pt unable to cooperate with fluorescencein angiogram  4. No retinal edema on exam or OCT  5. PVD / vitreous syneresis OU  - Discussed findings and prognosis  - No RT or RD on 360 peripheral exam  - Reviewed s/s of RT/RD  - Strict return precautions for any such RT/RD signs/symptoms  6. Pseudophakia OU  - s/p CE/IOL OU  - beautiful surgeries, doing well  - monitor   Ophthalmic Meds Ordered this visit:  Meds ordered this encounter  Medications   aflibercept (EYLEA) SOLN 2 mg       Return in about 16 weeks (around 03/21/2020) for f/u exu ARMD OS, DFE, OCT.  There are no Patient Instructions on file for this visit.   Explained the diagnoses, plan, and follow up with the patient and they expressed  understanding.  Patient expressed understanding of the importance of proper follow up care.   This document serves as a record of services personally performed by Gardiner Sleeper, MD, PhD. It was created on their behalf by San Jetty. Owens Shark, OA an ophthalmic technician. The creation of this record is the provider's dictation and/or activities during the visit.    Electronically signed by: San Jetty. Owens Shark, [place] 08.11.2021 2:05 PM  Gardiner Sleeper, M.D., Ph.D. Diseases & Surgery of the Retina and Vitreous [facility]  I have reviewed the above documentation for accuracy and completeness, and I agree with the above. Gardiner Sleeper, M.D., Ph.D. 11/30/19 2:05 PM   Abbreviations: M myopia (nearsighted); A astigmatism; H hyperopia (farsighted); P presbyopia; Mrx spectacle prescription;  CTL contact lenses; OD right eye; OS left eye; OU both eyes  XT exotropia; ET esotropia; PEK punctate epithelial keratitis; PEE punctate epithelial erosions; DES dry eye syndrome; MGD meibomian gland dysfunction; ATs artificial tears; PFAT's preservative free artificial tears; [place] nuclear sclerotic cataract; PSC posterior subcapsular cataract; ERM epi-retinal membrane; PVD posterior vitreous detachment; RD retinal detachment; DM diabetes mellitus; DR diabetic retinopathy; NPDR non-proliferative diabetic retinopathy; PDR proliferative diabetic retinopathy; CSME clinically significant macular edema; DME diabetic macular edema; dbh dot blot hemorrhages; CWS cotton wool spot; POAG primary open angle glaucoma; C/D cup-to-disc ratio; HVF humphrey visual field; GVF goldmann visual field; OCT  optical coherence tomography; IOP intraocular pressure; BRVO Branch retinal vein occlusion; CRVO central retinal vein occlusion; CRAO central retinal artery occlusion; BRAO branch retinal artery occlusion; RT retinal tear; SB scleral buckle; PPV pars plana vitrectomy; VH Vitreous hemorrhage; PRP panretinal laser  photocoagulation; IVK intravitreal kenalog; VMT vitreomacular traction; MH Macular hole;  NVD neovascularization of the disc; NVE neovascularization elsewhere; AREDS age related eye disease study; ARMD age related macular degeneration; POAG primary open angle glaucoma; EBMD epithelial/anterior basement membrane dystrophy; ACIOL anterior chamber intraocular lens; IOL intraocular lens; PCIOL posterior chamber intraocular lens; Phaco/IOL phacoemulsification with intraocular lens placement; [place] photorefractive keratectomy; LASIK laser assisted in situ keratomileusis; HTN hypertension; DM diabetes mellitus; COPD chronic obstructive pulmonary disease

## 2019-11-30 ENCOUNTER — Other Ambulatory Visit: Payer: Self-pay

## 2019-11-30 ENCOUNTER — Ambulatory Visit (INDEPENDENT_AMBULATORY_CARE_PROVIDER_SITE_OTHER): Payer: Medicare Other | Admitting: Ophthalmology

## 2019-11-30 ENCOUNTER — Encounter (INDEPENDENT_AMBULATORY_CARE_PROVIDER_SITE_OTHER): Payer: Self-pay | Admitting: Ophthalmology

## 2019-11-30 DIAGNOSIS — E113293 Type 2 diabetes mellitus with mild nonproliferative diabetic retinopathy without macular edema, bilateral: Secondary | ICD-10-CM | POA: Diagnosis not present

## 2019-11-30 DIAGNOSIS — H43813 Vitreous degeneration, bilateral: Secondary | ICD-10-CM | POA: Diagnosis not present

## 2019-11-30 DIAGNOSIS — H353221 Exudative age-related macular degeneration, left eye, with active choroidal neovascularization: Secondary | ICD-10-CM

## 2019-11-30 DIAGNOSIS — H3581 Retinal edema: Secondary | ICD-10-CM | POA: Diagnosis not present

## 2019-11-30 DIAGNOSIS — H353112 Nonexudative age-related macular degeneration, right eye, intermediate dry stage: Secondary | ICD-10-CM

## 2019-11-30 DIAGNOSIS — Z961 Presence of intraocular lens: Secondary | ICD-10-CM

## 2019-11-30 MED ORDER — AFLIBERCEPT 2MG/0.05ML IZ SOLN FOR KALEIDOSCOPE
2.0000 mg | INTRAVITREAL | Status: AC | PRN
  Administered 2019-11-30: 2 mg via INTRAVITREAL

## 2020-03-05 DIAGNOSIS — I10 Essential (primary) hypertension: Secondary | ICD-10-CM | POA: Diagnosis not present

## 2020-03-05 DIAGNOSIS — E1129 Type 2 diabetes mellitus with other diabetic kidney complication: Secondary | ICD-10-CM | POA: Diagnosis not present

## 2020-03-05 DIAGNOSIS — N183 Chronic kidney disease, stage 3 unspecified: Secondary | ICD-10-CM | POA: Diagnosis not present

## 2020-03-05 DIAGNOSIS — E1139 Type 2 diabetes mellitus with other diabetic ophthalmic complication: Secondary | ICD-10-CM | POA: Diagnosis not present

## 2020-03-05 DIAGNOSIS — E785 Hyperlipidemia, unspecified: Secondary | ICD-10-CM | POA: Diagnosis not present

## 2020-03-11 DIAGNOSIS — E785 Hyperlipidemia, unspecified: Secondary | ICD-10-CM | POA: Diagnosis not present

## 2020-03-11 DIAGNOSIS — E1122 Type 2 diabetes mellitus with diabetic chronic kidney disease: Secondary | ICD-10-CM | POA: Diagnosis not present

## 2020-03-11 DIAGNOSIS — R7309 Other abnormal glucose: Secondary | ICD-10-CM | POA: Diagnosis not present

## 2020-03-11 DIAGNOSIS — N1831 Chronic kidney disease, stage 3a: Secondary | ICD-10-CM | POA: Diagnosis not present

## 2020-03-11 DIAGNOSIS — Z23 Encounter for immunization: Secondary | ICD-10-CM | POA: Diagnosis not present

## 2020-03-11 DIAGNOSIS — I10 Essential (primary) hypertension: Secondary | ICD-10-CM | POA: Diagnosis not present

## 2020-03-20 NOTE — Progress Notes (Signed)
[facility] Note  03/21/2020     CHIEF COMPLAINT Patient presents for Retina Follow Up   HISTORY OF PRESENT ILLNESS: [name] is a 86 y.o. female who presents to the clinic today for:   HPI    Retina Follow Up    Patient presents with  Wet AMD.  In left eye.  This started years ago.  Severity is moderate.  Duration of 16 weeks.  Since onset it is stable.  I, the attending physician,  performed the HPI with the patient and updated documentation appropriately.          Comments    86 y/o female pt here for 16 wk f/u for exu ARMD OS.  No change in [place] OU.  Denies pain, FOL, floaters.  Refresh prn OU.  BS 126 yesterday.  A1C 7.4 1.5 wks ago.       Last edited by [name], MD on 03/21/2020  1:35 PM. (History)    pt states she is doing well, no change in vision  Referring physician: [name], MD [address] [place],  [place] [address]  HISTORICAL INFORMATION:   Selected notes from the MEDICAL RECORD NUMBER Referred by Dr. [name] for concern of exudative AMD LEE- 02.27.19 (W. Turner) [BCVA OD: 20/40 OS: 20/70-] Ocular Hx- pseudophakia OU, lid sx (Shapiro x 24 yrs ago) PMH- DM, HTN    CURRENT MEDICATIONS: No current outpatient medications on file. (Ophthalmic Drugs)   Current Facility-Administered Medications (Ophthalmic Drugs)  Medication Route  . aflibercept (EYLEA) SOLN 2 mg Intravitreal  . aflibercept (EYLEA) SOLN 2 mg Intravitreal  . aflibercept (EYLEA) SOLN 2 mg Intravitreal  . aflibercept (EYLEA) SOLN 2 mg Intravitreal  . aflibercept (EYLEA) SOLN 2 mg Intravitreal  . aflibercept (EYLEA) SOLN 2 mg Intravitreal   Current Outpatient Medications (Other)  Medication Sig  . ACCU-CHEK AVIVA PLUS test strip daily.  Marland Kitchen acetaminophen (TYLENOL) 500 MG tablet Take 500 mg by mouth every 6 (six) hours as needed. For arthritis pain  . cephALEXin (KEFLEX) 500 MG capsule Take 1 capsule (500 mg total) by mouth 3 (three) times  daily.  Marland Kitchen gabapentin (NEURONTIN) 100 MG capsule Take 100 mg by mouth daily.  . hydrochlorothiazide (HYDRODIURIL) 25 MG tablet Take 25 mg by mouth daily.  . insulin glargine (LANTUS) 100 UNIT/ML injection Inject 35 Units into the skin daily.   . insulin glargine (LANTUS) 100 UNIT/ML injection   . losartan (COZAAR) 100 MG tablet Take 100 mg by mouth daily.  Marland Kitchen losartan-hydrochlorothiazide (HYZAAR) 100-25 MG per tablet Take 1 tablet by mouth daily.  . metFORMIN (GLUCOPHAGE) 500 MG tablet Take 500 mg by mouth daily.  . metFORMIN (GLUCOPHAGE-XR) 500 MG 24 hr tablet   . olmesartan (BENICAR) 20 MG tablet Take 20 mg by mouth daily.  . rosuvastatin (CRESTOR) 20 MG tablet Take by mouth.  Haig Prophet COMFORT INS SYR .5CC/30G 30G X 5/16" 0.5 ML MISC USE AS DIRECTED WITHOLANTUS.   Current Facility-Administered Medications (Other)  Medication Route  . Bevacizumab (AVASTIN) SOLN 1.25 mg Intravitreal  . Bevacizumab (AVASTIN) SOLN 1.25 mg Intravitreal  . Bevacizumab (AVASTIN) SOLN 1.25 mg Intravitreal      REVIEW OF SYSTEMS: ROS    Positive for: Musculoskeletal, Endocrine, Eyes   Negative for: Constitutional, Gastrointestinal, Neurological, Skin, Genitourinary, HENT, Cardiovascular, Respiratory, Psychiatric, Allergic/Imm, Heme/Lymph   Last edited by Matthew Folks, COA on 03/21/2020  1:08 PM. (History)       ALLERGIES  No Known Allergies  PAST MEDICAL HISTORY Past Medical History:  Diagnosis Date  . Arthritis   . Back pain 01/30/2015  . Diabetes mellitus without complication ([place])   . Diabetic retinopathy ([place])    NPDR OU  . Hematuria 12/25/2014  . Hypertension   . Macular degeneration    Dry OD, Wet OS  . PONV (postoperative nausea and vomiting)   . Urinary urgency 12/25/2014  . UTI (lower urinary tract infection) 11/02/2013   Past Surgical History:  Procedure Laterality Date  . ABDOMINAL HYSTERECTOMY  1960s   with bladder tack up  . BREAST SURGERY     benign, right  . C-EYE SURGERY  PROCEDURE    . CATARACT EXTRACTION W/PHACO  02/08/2012   Procedure: CATARACT EXTRACTION PHACO AND INTRAOCULAR LENS PLACEMENT (IOC);  Surgeon: Elta Guadeloupe T. Gershon Crane, MD;  Location: [facility];  Service: Ophthalmology;  Laterality: Left;  CDE:11.62  . EYE SURGERY Bilateral    Cat Sx  . RECTOCELE REPAIR  1985   [facility], [place]    FAMILY HISTORY Family History  Problem Relation Age of Onset  . Diabetes Mother   . Diabetes Brother   . Diabetes Brother   . Heart attack Brother   . Cancer Brother        lung  . Cancer Brother        lung  . Birth defects Brother        passed away at age 87; hole in heart  . Emphysema Brother     SOCIAL HISTORY Social History   Tobacco Use  . Smoking status: Never Smoker  . Smokeless tobacco: Never Used  Vaping Use  . Vaping Use: Never used  Substance Use Topics  . Alcohol use: No  . Drug use: No         OPHTHALMIC EXAM:  Base Eye Exam    Visual Acuity (Snellen - Linear)      Right Left   Dist [place] 20/50 20/60   Dist ph [place] NI 20/50       Tonometry (Tonopen, 1:10 PM)      Right Left   Pressure 15 15       Pupils      Dark Light Shape React APD   Right 3 2 Round Brisk None   Left 3 2 Round Brisk None       Visual Fields (Counting fingers)      Left Right    Full Full       Extraocular Movement      Right Left    Full, Ortho Full, Ortho       Neuro/Psych    Oriented x3: Yes   Mood/Affect: Normal       Dilation    Both eyes: 1.0% Mydriacyl, 2.5% Phenylephrine @ 1:10 PM        Slit Lamp and Fundus Exam    Slit Lamp Exam      Right Left   Lids/Lashes Dermatochalasis - upper lid, Meibomian gland dysfunction Dermatochalasis - upper lid, Ptosis   Conjunctiva/Sclera White and quiet Inferior Conjunctivochalasis   Cornea Arcus, 1-2+Punctate epithelial erosions, Debris in tear film Arcus, 2+ inferior Punctate epithelial erosions   Anterior Chamber Deep and quiet Deep and quiet   [place] Patent peripheral iridectomy at 0900, 360  peripapillary Transillumination defects at 0430 and 0800 to 0900 Round and dilated   Lens PC IOL in good position, 1+ Posterior capsular opacification PC IOL in good position, 1+ non-central Posterior capsular opacification  Vitreous Posterior vitreous detachment Posterior vitreous detachment, Vitreous syneresis       Fundus Exam      Right Left   Disc Temporal Peripapillary atrophy and pigmenation, Sharp rim, mild Pallor 360 Peripapillary atrophy, 2-3+Pallor, Sharp rim   C/D Ratio 0.2 0.4   Macula Flat, Blunted foveal reflex, RPE mottling and clumping, Drusen, RPE atrophy nasal to fovea, No heme or edema +CNVM with no edema, Blunted foveal reflex, Drusen, RPE atrophy, mottling and clumping, early sub-retinal fibrosis, No heme or edema   Vessels Vascular attenuation, Tortuousity Vascular attenuation   Periphery Attached, mild peripheral drusen, Reticular degeneration, No heme  Attached, mild peripheral drusen, mild reticular degeneration, No heme           IMAGING AND PROCEDURES  Imaging and Procedures for 08/17/17  OCT, Retina - OU - Both Eyes       Right Eye Quality was good. Central Foveal Thickness: 187. Progression has been stable. Findings include normal foveal contour, no IRF, no SRF, epiretinal membrane, outer retinal atrophy, retinal drusen  (Thin choroid; focal ORA nasal macula).   Left Eye Quality was good. Central Foveal Thickness: 218. Progression has been stable. Findings include retinal drusen , subretinal hyper-reflective material, normal foveal contour, no IRF, no SRF, pigment epithelial detachment, outer retinal atrophy, epiretinal membrane (Persistent SRHM/PED -- no overlying fluid, thin choroid).   Notes *Images captured and stored on drive  Diagnosis / Impression:  No DME OU Non-Exudative ARMD OD Exudative ARMD OS with SRHM / PED; significant ORA -- fluid remains resolved  Clinical management:  See below  Abbreviations: NFP - Normal foveal profile. CME -  cystoid macular edema. PED - pigment epithelial detachment. IRF - intraretinal fluid. SRF - subretinal fluid. EZ - ellipsoid zone. ERM - epiretinal membrane. ORA - outer retinal atrophy. ORT - outer retinal tubulation. SRHM - subretinal hyper-reflective material                  ASSESSMENT/PLAN:    ICD-10-CM   1. Exudative age-related macular degeneration of left eye with active choroidal neovascularization ([place])  H35.3221   2. Intermediate stage nonexudative age-related macular degeneration of right eye  H35.3112   3. Mild nonproliferative diabetic retinopathy of both eyes without macular edema associated with type 2 diabetes mellitus ([place])  Z61.0960   4. Retinal edema  H35.81 OCT, Retina - OU - Both Eyes  5. Posterior vitreous detachment of both eyes  H43.813   6. Pseudophakia of both eyes  Z96.1     1. Exudative age related macular degeneration, OS    - S/P IVA OS #1 (03.05.19), #2 (04.03.19), #3 (05.01.19), #4 (01.06.21) -- switched to Eylea due to poor response  - Good Days approved in 2020  - S/P IVE OS #1 (05.29.19), #2 (06.26.19), #3 (07.31.19), #4 (09.11.19), #5 (11.14.19), #6 (01.24.20), #7 (05.06.20), #8 (09.16.20), #9 (04.12.21), #10 (08.13.21)  - BCVA 20/50 OS  - OCT with stably improved PED/SRHM, no overlying IRF/SRF at 16 wks post-IVE  - review of OCTs shows stability over multiple maintenance intervals of 16 wks  - recommend holding IVE today with return in 3 months for recheck  - pt in agreement   - Eylea informed consent form signed and scanned on 09.16.2020  - Eylea4U benefits investigation started 01.06.21 -- approved for 2021  - f/u 3 months -- DFE/OCT/possible injxn   2. Age related macular degeneration, non-exudative, OD  - The incidence, anatomy, and pathology of dry AMD, risk of progression, and  the AREDS and AREDS 2 study including smoking risks discussed with patient.  - stable  - continue amsler grid monitoring  3. Mild nonproliferative diabetic  retinopathy w/o DME, both eyes  - The incidence, risk factors for progression, natural history and treatment options for diabetic retinopathy were discussed with patient.    - The need for close monitoring of blood glucose, blood pressure, and serum lipids, avoiding cigarette or any type of tobacco, and the need for long term follow up was also discussed with patient.  - exam with rare IRH   - pt unable to cooperate with fluorescencein angiogram  4. No retinal edema on exam or OCT  5. PVD / vitreous syneresis OU  - Discussed findings and prognosis  - No RT or RD on 360 peripheral exam  - Reviewed s/s of RT/RD  - Strict return precautions for any such RT/RD signs/symptoms  6. Pseudophakia OU  - s/p CE/IOL OU  - beautiful surgeries, doing well  - monitor   Ophthalmic Meds Ordered this visit:  No orders of the defined types were placed in this encounter.      Return in about 3 months (around 06/19/2020) for ex ARMD OS - Dilated Exam, OCT.  There are no Patient Instructions on file for this visit.   Explained the diagnoses, plan, and follow up with the patient and they expressed understanding.  Patient expressed understanding of the importance of proper follow up care.   This document serves as a record of services personally performed by Gardiner Sleeper, MD, PhD. It was created on their behalf by San Jetty. Owens Shark, OA an ophthalmic technician. The creation of this record is the provider's dictation and/or activities during the visit.    Electronically signed by: San Jetty. Marguerita Merles 12.02.2021 11:16 PM  Gardiner Sleeper, M.D., Ph.D. Diseases & Surgery of the Retina and Vitreous [facility]  I have reviewed the above documentation for accuracy and completeness, and I agree with the above. Gardiner Sleeper, M.D., Ph.D. 03/21/20 11:16 PM  Abbreviations: M myopia (nearsighted); A astigmatism; H hyperopia (farsighted); P presbyopia; Mrx spectacle prescription;  CTL  contact lenses; OD right eye; OS left eye; OU both eyes  XT exotropia; ET esotropia; PEK punctate epithelial keratitis; PEE punctate epithelial erosions; DES dry eye syndrome; MGD meibomian gland dysfunction; ATs artificial tears; PFAT's preservative free artificial tears; [place] nuclear sclerotic cataract; PSC posterior subcapsular cataract; ERM epi-retinal membrane; PVD posterior vitreous detachment; RD retinal detachment; DM diabetes mellitus; DR diabetic retinopathy; NPDR non-proliferative diabetic retinopathy; PDR proliferative diabetic retinopathy; CSME clinically significant macular edema; DME diabetic macular edema; dbh dot blot hemorrhages; CWS cotton wool spot; POAG primary open angle glaucoma; C/D cup-to-disc ratio; HVF humphrey visual field; GVF goldmann visual field; OCT optical coherence tomography; IOP intraocular pressure; BRVO Branch retinal vein occlusion; CRVO central retinal vein occlusion; CRAO central retinal artery occlusion; BRAO branch retinal artery occlusion; RT retinal tear; SB scleral buckle; PPV pars plana vitrectomy; VH Vitreous hemorrhage; PRP panretinal laser photocoagulation; IVK intravitreal kenalog; VMT vitreomacular traction; MH Macular hole;  NVD neovascularization of the disc; NVE neovascularization elsewhere; AREDS age related eye disease study; ARMD age related macular degeneration; POAG primary open angle glaucoma; EBMD epithelial/anterior basement membrane dystrophy; ACIOL anterior chamber intraocular lens; IOL intraocular lens; PCIOL posterior chamber intraocular lens; Phaco/IOL phacoemulsification with intraocular lens placement; [place] photorefractive keratectomy; LASIK laser assisted in situ keratomileusis; HTN hypertension; DM diabetes mellitus; COPD chronic obstructive pulmonary disease

## 2020-03-21 ENCOUNTER — Other Ambulatory Visit: Payer: Self-pay

## 2020-03-21 ENCOUNTER — Encounter (INDEPENDENT_AMBULATORY_CARE_PROVIDER_SITE_OTHER): Payer: Self-pay | Admitting: Ophthalmology

## 2020-03-21 ENCOUNTER — Ambulatory Visit (INDEPENDENT_AMBULATORY_CARE_PROVIDER_SITE_OTHER): Payer: Medicare Other | Admitting: Ophthalmology

## 2020-03-21 DIAGNOSIS — H3581 Retinal edema: Secondary | ICD-10-CM | POA: Diagnosis not present

## 2020-03-21 DIAGNOSIS — H353221 Exudative age-related macular degeneration, left eye, with active choroidal neovascularization: Secondary | ICD-10-CM | POA: Diagnosis not present

## 2020-03-21 DIAGNOSIS — H43813 Vitreous degeneration, bilateral: Secondary | ICD-10-CM | POA: Diagnosis not present

## 2020-03-21 DIAGNOSIS — Z961 Presence of intraocular lens: Secondary | ICD-10-CM

## 2020-03-21 DIAGNOSIS — E113293 Type 2 diabetes mellitus with mild nonproliferative diabetic retinopathy without macular edema, bilateral: Secondary | ICD-10-CM

## 2020-03-21 DIAGNOSIS — H02003 Unspecified entropion of right eye, unspecified eyelid: Secondary | ICD-10-CM

## 2020-03-21 DIAGNOSIS — H353112 Nonexudative age-related macular degeneration, right eye, intermediate dry stage: Secondary | ICD-10-CM

## 2020-06-09 DIAGNOSIS — E1129 Type 2 diabetes mellitus with other diabetic kidney complication: Secondary | ICD-10-CM | POA: Diagnosis not present

## 2020-06-09 DIAGNOSIS — Z79899 Other long term (current) drug therapy: Secondary | ICD-10-CM | POA: Diagnosis not present

## 2020-06-09 DIAGNOSIS — E785 Hyperlipidemia, unspecified: Secondary | ICD-10-CM | POA: Diagnosis not present

## 2020-06-09 DIAGNOSIS — I10 Essential (primary) hypertension: Secondary | ICD-10-CM | POA: Diagnosis not present

## 2020-06-09 DIAGNOSIS — N183 Chronic kidney disease, stage 3 unspecified: Secondary | ICD-10-CM | POA: Diagnosis not present

## 2020-06-11 DIAGNOSIS — N1831 Chronic kidney disease, stage 3a: Secondary | ICD-10-CM | POA: Diagnosis not present

## 2020-06-11 DIAGNOSIS — N390 Urinary tract infection, site not specified: Secondary | ICD-10-CM | POA: Diagnosis not present

## 2020-06-11 DIAGNOSIS — E1122 Type 2 diabetes mellitus with diabetic chronic kidney disease: Secondary | ICD-10-CM | POA: Diagnosis not present

## 2020-06-11 DIAGNOSIS — R7309 Other abnormal glucose: Secondary | ICD-10-CM | POA: Diagnosis not present

## 2020-06-11 DIAGNOSIS — I10 Essential (primary) hypertension: Secondary | ICD-10-CM | POA: Diagnosis not present

## 2020-06-11 DIAGNOSIS — E785 Hyperlipidemia, unspecified: Secondary | ICD-10-CM | POA: Diagnosis not present

## 2020-06-13 NOTE — Progress Notes (Signed)
[facility] Note  06/20/2020     CHIEF COMPLAINT Patient presents for Retina Follow Up   HISTORY OF PRESENT ILLNESS: [name] is a 87 y.o. female who presents to the clinic today for:   HPI    Retina Follow Up    Patient presents with  Wet AMD.  In left eye.  This started months ago.  Severity is moderate.  Duration of months.  Since onset it is stable.  I, the attending physician,  performed the HPI with the patient and updated documentation appropriately.          Comments    Pt states her vision is very good OU and has no complaints.  Pt states her floaters have resolved.  Pt denies eye pain or discomfort.       Last edited by [name], MD on 06/21/2020  1:35 AM. (History)    pt states   Referring physician: [name], MD [address] [place],  [place] [address]  HISTORICAL INFORMATION:   Selected notes from the MEDICAL RECORD NUMBER Referred by Dr. [name] for concern of exudative AMD LEE- 02.27.19 (W. [facility]) [BCVA OD: 20/40 OS: 20/70-] Ocular Hx- pseudophakia OU, lid sx (Shapiro x 24 yrs ago) PMH- DM, HTN    CURRENT MEDICATIONS: No current outpatient medications on file. (Ophthalmic Drugs)   Current Facility-Administered Medications (Ophthalmic Drugs)  Medication Route  . aflibercept (EYLEA) SOLN 2 mg Intravitreal  . aflibercept (EYLEA) SOLN 2 mg Intravitreal  . aflibercept (EYLEA) SOLN 2 mg Intravitreal  . aflibercept (EYLEA) SOLN 2 mg Intravitreal  . aflibercept (EYLEA) SOLN 2 mg Intravitreal  . aflibercept (EYLEA) SOLN 2 mg Intravitreal   Current Outpatient Medications (Other)  Medication Sig  . ACCU-CHEK AVIVA PLUS test strip daily.  Marland Kitchen acetaminophen (TYLENOL) 500 MG tablet Take 500 mg by mouth every 6 (six) hours as needed. For arthritis pain  . cephALEXin (KEFLEX) 500 MG capsule Take 1 capsule (500 mg total) by mouth 3 (three) times daily.  Marland Kitchen gabapentin (NEURONTIN) 100 MG capsule Take 100 mg by mouth  daily.  . hydrochlorothiazide (HYDRODIURIL) 25 MG tablet Take 25 mg by mouth daily.  . insulin glargine (LANTUS) 100 UNIT/ML injection Inject 35 Units into the skin daily.   . insulin glargine (LANTUS) 100 UNIT/ML injection   . losartan (COZAAR) 100 MG tablet Take 100 mg by mouth daily.  Marland Kitchen losartan-hydrochlorothiazide (HYZAAR) 100-25 MG per tablet Take 1 tablet by mouth daily.  . metFORMIN (GLUCOPHAGE) 500 MG tablet Take 500 mg by mouth daily.  . metFORMIN (GLUCOPHAGE-XR) 500 MG 24 hr tablet   . olmesartan (BENICAR) 20 MG tablet Take 20 mg by mouth daily.  . rosuvastatin (CRESTOR) 20 MG tablet Take by mouth.  Haig Prophet COMFORT INS SYR .5CC/30G 30G X 5/16" 0.5 ML MISC USE AS DIRECTED WITHOLANTUS.   Current Facility-Administered Medications (Other)  Medication Route  . Bevacizumab (AVASTIN) SOLN 1.25 mg Intravitreal  . Bevacizumab (AVASTIN) SOLN 1.25 mg Intravitreal  . Bevacizumab (AVASTIN) SOLN 1.25 mg Intravitreal      REVIEW OF SYSTEMS: ROS    Positive for: Musculoskeletal, Endocrine, Eyes   Negative for: Constitutional, Gastrointestinal, Neurological, Skin, Genitourinary, HENT, Cardiovascular, Respiratory, Psychiatric, Allergic/Imm, Heme/Lymph   Last edited by [name] on 06/20/2020  2:07 PM. (History)       ALLERGIES No Known Allergies  PAST MEDICAL HISTORY Past Medical History:  Diagnosis Date  . Arthritis   . Back pain  01/30/2015  . Diabetes mellitus without complication ([place])   . Diabetic retinopathy ([place])    NPDR OU  . Hematuria 12/25/2014  . Hypertension   . Macular degeneration    Dry OD, Wet OS  . PONV (postoperative nausea and vomiting)   . Urinary urgency 12/25/2014  . UTI (lower urinary tract infection) 11/02/2013   Past Surgical History:  Procedure Laterality Date  . ABDOMINAL HYSTERECTOMY  1960s   with bladder tack up  . BREAST SURGERY     benign, right  . C-EYE SURGERY PROCEDURE    . CATARACT EXTRACTION W/PHACO  02/08/2012   Procedure: CATARACT  EXTRACTION PHACO AND INTRAOCULAR LENS PLACEMENT (IOC);  Surgeon: Elta Guadeloupe T. Gershon Crane, MD;  Location: [facility];  Service: Ophthalmology;  Laterality: Left;  CDE:11.62  . EYE SURGERY Bilateral    Cat Sx  . RECTOCELE REPAIR  1985   [facility], [place]    FAMILY HISTORY Family History  Problem Relation Age of Onset  . Diabetes Mother   . Diabetes Brother   . Diabetes Brother   . Heart attack Brother   . Cancer Brother        lung  . Cancer Brother        lung  . Birth defects Brother        passed away at age 57; hole in heart  . Emphysema Brother     SOCIAL HISTORY Social History   Tobacco Use  . Smoking status: Never Smoker  . Smokeless tobacco: Never Used  Vaping Use  . Vaping Use: Never used  Substance Use Topics  . Alcohol use: No  . Drug use: No         OPHTHALMIC EXAM:  Base Eye Exam    Visual Acuity (Snellen - Linear)      Right Left   Dist [place] 20/60 -1 20/60 -2   Dist ph [place] 20/50 -1 NI       Tonometry (Tonopen, 1:44 PM)      Right Left   Pressure 19 19       Pupils      Dark Light Shape React APD   Right 3 2 Round Minimal 0   Left 3 2 Round Minimal 0       Visual Fields      Left Right    Full Full       Extraocular Movement      Right Left    Full Full       Neuro/Psych    Oriented x3: Yes   Mood/Affect: Normal       Dilation    Both eyes: 1.0% Mydriacyl, 2.5% Phenylephrine @ 1:44 PM        Slit Lamp and Fundus Exam    Slit Lamp Exam      Right Left   Lids/Lashes Dermatochalasis - upper lid, Meibomian gland dysfunction Dermatochalasis - upper lid, Ptosis   Conjunctiva/Sclera White and quiet Inferior Conjunctivochalasis   Cornea Arcus, 1-2+Punctate epithelial erosions, Debris in tear film Arcus, 2+ inferior Punctate epithelial erosions   Anterior Chamber Deep and quiet Deep and quiet   [place]ris Patent peripheral iridectomy at 0900, 360 peripapillary Transillumination defects at 0430 and 0800 to 0900 Round and dilated   Lens PC IOL in good  position, 1+ Posterior capsular opacification PC IOL in good position, 1+ non-central Posterior capsular opacification   Vitreous Posterior vitreous detachment Posterior vitreous detachment, Vitreous syneresis       Fundus Exam  Right Left   Disc Temporal Peripapillary atrophy and pigmenation, Sharp rim, mild Pallor 360 Peripapillary atrophy, 2-3+Pallor, Sharp rim   C/D Ratio 0.2 0.4   Macula Flat, Blunted foveal reflex, RPE mottling and clumping, Drusen, RPE atrophy nasal to fovea, No heme or edema +CNVM with interval re-development of central edema, +punctate hemes, Blunted foveal reflex, Drusen, RPE atrophy, mottling and clumping, early sub-retinal fibrosis   Vessels attenuated, mild tortuousity Vascular attenuation   Periphery Attached, mild peripheral drusen, Reticular degeneration, No heme  Attached, mild peripheral drusen, mild reticular degeneration, No heme         Refraction    Wearing Rx      Sphere Cylinder Axis Add   Right -1.00 +0.75 012 +2.75   Left -0.25 +1.00 013 +2.50          IMAGING AND PROCEDURES  Imaging and Procedures for 08/17/17  OCT, Retina - OU - Both Eyes       Right Eye Quality was good. Central Foveal Thickness: 186. Progression has been stable. Findings include normal foveal contour, no IRF, no SRF, epiretinal membrane, outer retinal atrophy, retinal drusen  (Thin choroid; focal ORA nasal macula).   Left Eye Quality was good. Central Foveal Thickness: 421. Progression has been stable. Findings include retinal drusen , subretinal hyper-reflective material, pigment epithelial detachment, outer retinal atrophy, epiretinal membrane, abnormal foveal contour, intraretinal fluid, subretinal fluid (Interval re-development of IRF/SRF/IRHM).   Notes *Images captured and stored on drive  Diagnosis / Impression:  No DME OU Non-Exudative ARMD OD Exudative ARMD OS -- Interval re-development of IRF/SRF/IRHM  Clinical management:  See  below  Abbreviations: NFP - Normal foveal profile. CME - cystoid macular edema. PED - pigment epithelial detachment. IRF - intraretinal fluid. SRF - subretinal fluid. EZ - ellipsoid zone. ERM - epiretinal membrane. ORA - outer retinal atrophy. ORT - outer retinal tubulation. SRHM - subretinal hyper-reflective material         Intravitreal Injection, Pharmacologic Agent - OS - Left Eye       Time Out 06/20/2020. 2:24 PM. Confirmed correct patient, procedure, site, and patient consented.   Anesthesia Topical anesthesia was used. Anesthetic medications included Lidocaine 2%, Proparacaine 0.5%.   Procedure Preparation included eyelid speculum, 5% betadine to ocular surface. A (32g) needle was used.   Injection:  2 mg aflibercept Alfonse Flavors) SOLN   NDC: A3590391, Lot: [id number], Expiration date: 09/15/2020   Route: Intravitreal, Site: Left Eye, Waste: 0.05 mL  Post-op Post injection exam found visual acuity of at least counting fingers. The patient tolerated the procedure well. There were no complications. The patient received written and verbal post procedure care education.                 ASSESSMENT/PLAN:    ICD-10-CM   1. Exudative age-related macular degeneration of left eye with active choroidal neovascularization (HCC)  H35.3221 Intravitreal Injection, Pharmacologic Agent - OS - Left Eye    aflibercept (EYLEA) SOLN 2 mg  2. Intermediate stage nonexudative age-related macular degeneration of right eye  H35.3112   3. Mild nonproliferative diabetic retinopathy of both eyes without macular edema associated with type 2 diabetes mellitus ([place])  S50.5397   4. Retinal edema  H35.81 OCT, Retina - OU - Both Eyes  5. Posterior vitreous detachment of both eyes  H43.813   6. Pseudophakia of both eyes  Z96.1     1. Exudative age related macular degeneration, OS    - S/P IVA OS #1 (03.05.19), #2 (04.03.19), #  3 (05.01.19), #4 (01.06.21) -- switched to Eylea due to poor response  -  Good Days approved in 2020  - S/P IVE OS #1 (05.29.19), #2 (06.26.19), #3 (07.31.19), #4 (09.11.19), #5 (11.14.19), #6 (01.24.20), #7 (05.06.20), #8 (09.16.20), #9 (04.12.21), #10 (08.13.21) -- 7 mos since last injection  - BCVA 20/60 OS -- decreased  - OCT with interval re-development of IRF/SRF/IRHM  - recommend IVE OS today, 03.04.22 due to re-development of IRF/SRF  - pt in agreement   - Eylea informed consent form signed and scanned on 03.04.22  - Eylea4U benefits investigation started 01.06.21 -- approved for 2022  - f/u 4 weeks -- DFE/OCT/possible injxn   2. Age related macular degeneration, non-exudative, OD  - The incidence, anatomy, and pathology of dry AMD, risk of progression, and the AREDS and AREDS 2 study including smoking risks discussed with patient.  - stable  - continue amsler grid monitoring  3. Mild nonproliferative diabetic retinopathy w/o DME, both eyes  - exam with rare IRH   - pt unable to cooperate with fluorescencein angiogram  4. No retinal edema on exam or OCT  5. PVD / vitreous syneresis OU  - Discussed findings and prognosis  - No RT or RD on 360 peripheral exam  - Reviewed s/s of RT/RD  - Strict return precautions for any such RT/RD signs/symptoms  6. Pseudophakia OU  - s/p CE/IOL OU  - beautiful surgeries, doing well  - monitor   Ophthalmic Meds Ordered this visit:  Meds ordered this encounter  Medications  . aflibercept (EYLEA) SOLN 2 mg       Return in about 4 weeks (around 07/18/2020) for f/u exu ARMD OS, DFE, OCT.  There are no Patient Instructions on file for this visit.   Explained the diagnoses, plan, and follow up with the patient and they expressed understanding.  Patient expressed understanding of the importance of proper follow up care.   This document serves as a record of services personally performed by Gardiner Sleeper, MD, PhD. It was created on their behalf by San Jetty. Owens Shark, OA an ophthalmic technician. The creation of  this record is the provider's dictation and/or activities during the visit.    Electronically signed by: San Jetty. [place], [place] 02.25.2022 1:38 AM  Gardiner Sleeper, M.D., Ph.D. Diseases & Surgery of the Retina and Vitreous [facility]  I have reviewed the above documentation for accuracy and completeness, and I agree with the above. Gardiner Sleeper, M.D., Ph.D. 06/21/20 1:38 AM   Abbreviations: M myopia (nearsighted); A astigmatism; H hyperopia (farsighted); P presbyopia; Mrx spectacle prescription;  CTL contact lenses; OD right eye; OS left eye; OU both eyes  XT exotropia; ET esotropia; PEK punctate epithelial keratitis; PEE punctate epithelial erosions; DES dry eye syndrome; MGD meibomian gland dysfunction; ATs artificial tears; PFAT's preservative free artificial tears; [place] nuclear sclerotic cataract; PSC posterior subcapsular cataract; ERM epi-retinal membrane; PVD posterior vitreous detachment; RD retinal detachment; DM diabetes mellitus; DR diabetic retinopathy; NPDR non-proliferative diabetic retinopathy; PDR proliferative diabetic retinopathy; CSME clinically significant macular edema; DME diabetic macular edema; dbh dot blot hemorrhages; CWS cotton wool spot; POAG primary open angle glaucoma; C/D cup-to-disc ratio; HVF humphrey visual field; GVF goldmann visual field; OCT optical coherence tomography; IOP intraocular pressure; BRVO Branch retinal vein occlusion; CRVO central retinal vein occlusion; CRAO central retinal artery occlusion; BRAO branch retinal artery occlusion; RT retinal tear; SB scleral buckle; PPV pars plana vitrectomy; VH Vitreous hemorrhage; PRP panretinal  laser photocoagulation; IVK intravitreal kenalog; VMT vitreomacular traction; MH Macular hole;  NVD neovascularization of the disc; NVE neovascularization elsewhere; AREDS age related eye disease study; ARMD age related macular degeneration; POAG primary open angle glaucoma; EBMD epithelial/anterior  basement membrane dystrophy; ACIOL anterior chamber intraocular lens; IOL intraocular lens; PCIOL posterior chamber intraocular lens; Phaco/IOL phacoemulsification with intraocular lens placement; [place] photorefractive keratectomy; LASIK laser assisted in situ keratomileusis; HTN hypertension; DM diabetes mellitus; COPD chronic obstructive pulmonary disease

## 2020-06-20 ENCOUNTER — Other Ambulatory Visit: Payer: Self-pay

## 2020-06-20 ENCOUNTER — Ambulatory Visit (INDEPENDENT_AMBULATORY_CARE_PROVIDER_SITE_OTHER): Payer: Medicare Other | Admitting: Ophthalmology

## 2020-06-20 DIAGNOSIS — H353112 Nonexudative age-related macular degeneration, right eye, intermediate dry stage: Secondary | ICD-10-CM | POA: Diagnosis not present

## 2020-06-20 DIAGNOSIS — H43813 Vitreous degeneration, bilateral: Secondary | ICD-10-CM

## 2020-06-20 DIAGNOSIS — E113293 Type 2 diabetes mellitus with mild nonproliferative diabetic retinopathy without macular edema, bilateral: Secondary | ICD-10-CM | POA: Diagnosis not present

## 2020-06-20 DIAGNOSIS — H353221 Exudative age-related macular degeneration, left eye, with active choroidal neovascularization: Secondary | ICD-10-CM

## 2020-06-20 DIAGNOSIS — H02003 Unspecified entropion of right eye, unspecified eyelid: Secondary | ICD-10-CM

## 2020-06-20 DIAGNOSIS — Z961 Presence of intraocular lens: Secondary | ICD-10-CM | POA: Diagnosis not present

## 2020-06-20 DIAGNOSIS — H3581 Retinal edema: Secondary | ICD-10-CM | POA: Diagnosis not present

## 2020-06-21 ENCOUNTER — Encounter (INDEPENDENT_AMBULATORY_CARE_PROVIDER_SITE_OTHER): Payer: Self-pay | Admitting: Ophthalmology

## 2020-06-21 DIAGNOSIS — H353221 Exudative age-related macular degeneration, left eye, with active choroidal neovascularization: Secondary | ICD-10-CM | POA: Diagnosis not present

## 2020-06-21 DIAGNOSIS — Z961 Presence of intraocular lens: Secondary | ICD-10-CM | POA: Diagnosis not present

## 2020-06-21 DIAGNOSIS — H43813 Vitreous degeneration, bilateral: Secondary | ICD-10-CM | POA: Diagnosis not present

## 2020-06-21 MED ORDER — AFLIBERCEPT 2MG/0.05ML IZ SOLN FOR KALEIDOSCOPE
2.0000 mg | INTRAVITREAL | Status: AC | PRN
  Administered 2020-06-21: 2 mg via INTRAVITREAL

## 2020-07-17 NOTE — Progress Notes (Signed)
[facility] Note  07/21/2020     CHIEF COMPLAINT Patient presents for Retina Follow Up   HISTORY OF PRESENT ILLNESS: [name] is a 87 y.o. female who presents to the clinic today for:   HPI    Retina Follow Up    Patient presents with  Wet AMD.  In left eye.  This started weeks ago.  Severity is moderate.  Duration of weeks.  Since onset it is stable.  I, the attending physician,  performed the HPI with the patient and updated documentation appropriately.          Comments    Pt states her vision is the same OU.  Pt denies eye pain or discomfort and denies any new or worsening floaters or fol OU.       Last edited by [name], MD on 07/22/2020  1:54 AM. (History)    pt states her right eye is watering a lot today, she states it feels like it's scratched and is very light sensitive today  Referring physician: [name], MD [address] [place],  [place] [address]  HISTORICAL INFORMATION:   Selected notes from the MEDICAL RECORD NUMBER Referred by Dr. [name] for concern of exudative AMD LEE- 02.27.19 ([facility]. [facility]) [BCVA OD: 20/40 OS: 20/70-] Ocular Hx- pseudophakia OU, lid sx (Shapiro x 24 yrs ago) PMH- DM, HTN    CURRENT MEDICATIONS: No current outpatient medications on file. (Ophthalmic Drugs)   Current Facility-Administered Medications (Ophthalmic Drugs)  Medication Route  . aflibercept (EYLEA) SOLN 2 mg Intravitreal  . aflibercept (EYLEA) SOLN 2 mg Intravitreal  . aflibercept (EYLEA) SOLN 2 mg Intravitreal  . aflibercept (EYLEA) SOLN 2 mg Intravitreal  . aflibercept (EYLEA) SOLN 2 mg Intravitreal  . aflibercept (EYLEA) SOLN 2 mg Intravitreal   Current Outpatient Medications (Other)  Medication Sig  . ACCU-CHEK AVIVA PLUS test strip daily.  Marland Kitchen acetaminophen (TYLENOL) 500 MG tablet Take 500 mg by mouth every 6 (six) hours as needed. For arthritis pain  . cephALEXin (KEFLEX) 500 MG capsule Take 1 capsule (500 mg total)  by mouth 3 (three) times daily.  Marland Kitchen gabapentin (NEURONTIN) 100 MG capsule Take 100 mg by mouth daily.  . hydrochlorothiazide (HYDRODIURIL) 25 MG tablet Take 25 mg by mouth daily.  . insulin glargine (LANTUS) 100 UNIT/ML injection Inject 35 Units into the skin daily.   . insulin glargine (LANTUS) 100 UNIT/ML injection   . losartan (COZAAR) 100 MG tablet Take 100 mg by mouth daily.  Marland Kitchen losartan-hydrochlorothiazide (HYZAAR) 100-25 MG per tablet Take 1 tablet by mouth daily.  . metFORMIN (GLUCOPHAGE) 500 MG tablet Take 500 mg by mouth daily.  . metFORMIN (GLUCOPHAGE-XR) 500 MG 24 hr tablet   . olmesartan (BENICAR) 20 MG tablet Take 20 mg by mouth daily.  . rosuvastatin (CRESTOR) 20 MG tablet Take by mouth.  Haig Prophet COMFORT INS SYR .5CC/30G 30G X 5/16" 0.5 ML MISC USE AS DIRECTED WITHOLANTUS.   Current Facility-Administered Medications (Other)  Medication Route  . Bevacizumab (AVASTIN) SOLN 1.25 mg Intravitreal  . Bevacizumab (AVASTIN) SOLN 1.25 mg Intravitreal  . Bevacizumab (AVASTIN) SOLN 1.25 mg Intravitreal      REVIEW OF SYSTEMS: ROS    Positive for: Musculoskeletal, Endocrine, Eyes   Negative for: Constitutional, Gastrointestinal, Neurological, Skin, Genitourinary, HENT, Cardiovascular, Respiratory, Psychiatric, Allergic/Imm, Heme/Lymph   Last edited by [name] on 07/21/2020  1:22 PM. (History)       ALLERGIES No Known  Allergies  PAST MEDICAL HISTORY Past Medical History:  Diagnosis Date  . Arthritis   . Back pain 01/30/2015  . Diabetes mellitus without complication ([place])   . Diabetic retinopathy ([place])    NPDR OU  . Hematuria 12/25/2014  . Hypertension   . Macular degeneration    Dry OD, Wet OS  . PONV (postoperative nausea and vomiting)   . Urinary urgency 12/25/2014  . UTI (lower urinary tract infection) 11/02/2013   Past Surgical History:  Procedure Laterality Date  . ABDOMINAL HYSTERECTOMY  1960s   with bladder tack up  . BREAST SURGERY     benign, right   . C-EYE SURGERY PROCEDURE    . CATARACT EXTRACTION W/PHACO  02/08/2012   Procedure: CATARACT EXTRACTION PHACO AND INTRAOCULAR LENS PLACEMENT (IOC);  Surgeon: Elta Guadeloupe T. Gershon Crane, MD;  Location: [facility];  Service: Ophthalmology;  Laterality: Left;  CDE:11.62  . EYE SURGERY Bilateral    Cat Sx  . RECTOCELE REPAIR  1985   [facility], [place]    FAMILY HISTORY Family History  Problem Relation Age of Onset  . Diabetes Mother   . Diabetes Brother   . Diabetes Brother   . Heart attack Brother   . Cancer Brother        lung  . Cancer Brother        lung  . Birth defects Brother        passed away at age 1; hole in heart  . Emphysema Brother     SOCIAL HISTORY Social History   Tobacco Use  . Smoking status: Never Smoker  . Smokeless tobacco: Never Used  Vaping Use  . Vaping Use: Never used  Substance Use Topics  . Alcohol use: No  . Drug use: No         OPHTHALMIC EXAM:  Base Eye Exam    Visual Acuity (Snellen - Linear)      Right Left   Dist [place] 20/60 -2 20/60 -2   Dist ph [place] 20/40 -2 NI       Tonometry (Tonopen, 1:31 PM)      Right Left   Pressure 15 16       Pupils      Dark Light Shape React APD   Right 2 1 Round Minimal 0   Left 2 1 Round Minimal 0       Visual Fields      Left Right    Full Full       Extraocular Movement      Right Left    Full Full       Neuro/Psych    Oriented x3: Yes   Mood/Affect: Normal       Dilation    Both eyes: 1.0% Mydriacyl, 2.5% Phenylephrine @ 1:31 PM        Slit Lamp and Fundus Exam    Slit Lamp Exam      Right Left   Lids/Lashes Dermatochalasis - upper lid, Meibomian gland dysfunction Dermatochalasis - upper lid, Ptosis   Conjunctiva/Sclera White and quiet Inferior Conjunctivochalasis   Cornea Arcus, 1+Punctate epithelial erosions, Debris in tear film Arcus, 2+ inferior Punctate epithelial erosions   Anterior Chamber Deep and quiet Deep and quiet   [place]ris Patent peripheral iridectomy at 0900, 360 peripapillary  Transillumination defects at 0430 and 0800 to 0900 Round and dilated   Lens PC IOL in good position, 1+ Posterior capsular opacification PC IOL in good position, 1+ non-central Posterior capsular opacification   Vitreous  Posterior vitreous detachment Posterior vitreous detachment, Vitreous syneresis       Fundus Exam      Right Left   Disc Temporal Peripapillary atrophy and pigmenation, Sharp rim, mild Pallor 360 Peripapillary atrophy, 2-3+Pallor, Sharp rim   C/D Ratio 0.2 0.4   Macula Flat, Blunted foveal reflex, RPE mottling and clumping, Drusen, RPE atrophy nasal to fovea, No heme or edema +CNVM with interval resolution of central edema, +punctate hemes, Blunted foveal reflex, Drusen, RPE atrophy, mottling and clumping, early sub-retinal fibrosis   Vessels attenuated, mild tortuousity Vascular attenuation   Periphery Attached, mild peripheral drusen, Reticular degeneration, No heme  Attached, mild peripheral drusen, mild reticular degeneration, No heme         Refraction    Wearing Rx      Sphere Cylinder Axis Add   Right -1.00 +0.75 012 +2.75   Left -0.25 +1.00 013 +2.50          IMAGING AND PROCEDURES  Imaging and Procedures for 08/17/17  OCT, Retina - OU - Both Eyes       Right Eye Quality was good. Central Foveal Thickness: 186. Progression has been stable. Findings include normal foveal contour, no IRF, no SRF, epiretinal membrane, outer retinal atrophy, retinal drusen  (Thin choroid; focal ORA nasal macula).   Left Eye Quality was good. Central Foveal Thickness: 421. Progression has improved. Findings include retinal drusen , subretinal hyper-reflective material, pigment epithelial detachment, outer retinal atrophy, epiretinal membrane, intraretinal fluid, normal foveal contour, no IRF, no SRF (Interval improvement / resolution of IRF/SRF/IRHM).   Notes *Images captured and stored on drive  Diagnosis / Impression:  No DME OU Non-Exudative ARMD OD Exudative ARMD OS  -- Interval improvement / resolution of IRF/SRF/IRHM  Clinical management:  See below  Abbreviations: NFP - Normal foveal profile. CME - cystoid macular edema. PED - pigment epithelial detachment. IRF - intraretinal fluid. SRF - subretinal fluid. EZ - ellipsoid zone. ERM - epiretinal membrane. ORA - outer retinal atrophy. ORT - outer retinal tubulation. SRHM - subretinal hyper-reflective material         Intravitreal Injection, Pharmacologic Agent - OS - Left Eye       Time Out 07/21/2020. 1:56 PM. Confirmed correct patient, procedure, site, and patient consented.   Anesthesia Topical anesthesia was used. Anesthetic medications included Lidocaine 2%, Proparacaine 0.5%.   Procedure Preparation included eyelid speculum, 5% betadine to ocular surface. A (32g) needle was used.   Injection:  2 mg aflibercept Alfonse Flavors) SOLN   NDC: A3590391, Lot: [id number], Expiration date: 01/16/2021   Route: Intravitreal, Site: Left Eye, Waste: 0.05 mL  Post-op Post injection exam found visual acuity of at least counting fingers. The patient tolerated the procedure well. There were no complications. The patient received written and verbal post procedure care education.                 ASSESSMENT/PLAN:    ICD-10-CM   1. Exudative age-related macular degeneration of left eye with active choroidal neovascularization (HCC)  H35.3221 Intravitreal Injection, Pharmacologic Agent - OS - Left Eye    aflibercept (EYLEA) SOLN 2 mg  2. Intermediate stage nonexudative age-related macular degeneration of right eye  H35.3112   3. Mild nonproliferative diabetic retinopathy of both eyes without macular edema associated with type 2 diabetes mellitus ([place])  E56.3149   4. Retinal edema  H35.81 OCT, Retina - OU - Both Eyes  5. Posterior vitreous detachment of both eyes  H43.813   6. Pseudophakia of  both eyes  Z96.1     1. Exudative age related macular degeneration, OS    - S/P IVA OS #1 (03.05.19), #2  (04.03.19), #3 (05.01.19), #4 (01.06.21) -- switched to Eylea due to poor response  - Good Days approved in 2020  - S/P IVE OS #1 (05.29.19), #2 (06.26.19), #3 (07.31.19), #4 (09.11.19), #5 (11.14.19), #6 (01.24.20), #7 (05.06.20), #8 (09.16.20), #9 (04.12.21), #10 (08.13.21), #11 (03.04.22)  - BCVA stable at 20/60 OS   - OCT with Interval improvement / resolution of IRF/SRF/IRHM  - recommend IVE OS #12 today, 04.04.22 -- maintenance w/ ext to 6 wks  - pt in agreement   - Eylea informed consent form signed and scanned on 03.04.22  - Eylea4U benefits investigation started 01.06.21 -- approved for 2022  - f/u 6 weeks -- DFE/OCT/possible injxn   2. Age related macular degeneration, non-exudative, OD  - The incidence, anatomy, and pathology of dry AMD, risk of progression, and the AREDS and AREDS 2 study including smoking risks discussed with patient.  - stable  - continue amsler grid monitoring  3. Mild nonproliferative diabetic retinopathy w/o DME, both eyes  - exam with rare IRH   - pt unable to cooperate with fluorescencein angiogram  4. No retinal edema on exam or OCT  5. PVD / vitreous syneresis OU  - Discussed findings and prognosis  - No RT or RD on 360 peripheral exam  - Reviewed s/s of RT/RD  - Strict return precautions for any such RT/RD signs/symptoms  6. Pseudophakia OU  - s/p CE/IOL OU  - beautiful surgeries, doing well  - monitor   Ophthalmic Meds Ordered this visit:  Meds ordered this encounter  Medications  . aflibercept (EYLEA) SOLN 2 mg       Return in about 6 weeks (around 09/01/2020) for f/u exu ARMD OS, DFE, OCT.  There are no Patient Instructions on file for this visit.  This document serves as a record of services personally performed by Gardiner Sleeper, MD, PhD. It was created on their behalf by Leeann Must, [place], an ophthalmic technician. The creation of this record is the provider's dictation and/or activities during the visit.    Electronically  signed by: Leeann Must, [place] 03.31.2022 2:13 AM   This document serves as a record of services personally performed by Gardiner Sleeper, MD, PhD. It was created on their behalf by San Jetty. Owens Shark, OA an ophthalmic technician. The creation of this record is the provider's dictation and/or activities during the visit.    Electronically signed by: San Jetty. Owens Shark, [place] 04.04.2022 2:13 AM  Gardiner Sleeper, M.D., Ph.D. Diseases & Surgery of the Retina and [place] 07/21/2020   I have reviewed the above documentation for accuracy and completeness, and I agree with the above. Gardiner Sleeper, M.D., Ph.D. 07/22/20 2:13 AM  Abbreviations: M myopia (nearsighted); A astigmatism; H hyperopia (farsighted); P presbyopia; Mrx spectacle prescription;  CTL contact lenses; OD right eye; OS left eye; OU both eyes  XT exotropia; ET esotropia; PEK punctate epithelial keratitis; PEE punctate epithelial erosions; DES dry eye syndrome; MGD meibomian gland dysfunction; ATs artificial tears; PFAT's preservative free artificial tears; [place] nuclear sclerotic cataract; PSC posterior subcapsular cataract; ERM epi-retinal membrane; PVD posterior vitreous detachment; RD retinal detachment; DM diabetes mellitus; DR diabetic retinopathy; NPDR non-proliferative diabetic retinopathy; PDR proliferative diabetic retinopathy; CSME clinically significant macular edema; DME diabetic macular edema; dbh dot blot hemorrhages; CWS cotton wool spot; POAG  primary open angle glaucoma; C/D cup-to-disc ratio; HVF humphrey visual field; GVF goldmann visual field; OCT optical coherence tomography; IOP intraocular pressure; BRVO Branch retinal vein occlusion; CRVO central retinal vein occlusion; CRAO central retinal artery occlusion; BRAO branch retinal artery occlusion; RT retinal tear; SB scleral buckle; PPV pars plana vitrectomy; VH Vitreous hemorrhage; PRP panretinal laser photocoagulation; IVK intravitreal kenalog;  VMT vitreomacular traction; MH Macular hole;  NVD neovascularization of the disc; NVE neovascularization elsewhere; AREDS age related eye disease study; ARMD age related macular degeneration; POAG primary open angle glaucoma; EBMD epithelial/anterior basement membrane dystrophy; ACIOL anterior chamber intraocular lens; IOL intraocular lens; PCIOL posterior chamber intraocular lens; Phaco/IOL phacoemulsification with intraocular lens placement; [place] photorefractive keratectomy; LASIK laser assisted in situ keratomileusis; HTN hypertension; DM diabetes mellitus; COPD chronic obstructive pulmonary disease

## 2020-07-21 ENCOUNTER — Other Ambulatory Visit: Payer: Self-pay

## 2020-07-21 ENCOUNTER — Ambulatory Visit (INDEPENDENT_AMBULATORY_CARE_PROVIDER_SITE_OTHER): Payer: Medicare Other | Admitting: Ophthalmology

## 2020-07-21 DIAGNOSIS — E113293 Type 2 diabetes mellitus with mild nonproliferative diabetic retinopathy without macular edema, bilateral: Secondary | ICD-10-CM

## 2020-07-21 DIAGNOSIS — H353112 Nonexudative age-related macular degeneration, right eye, intermediate dry stage: Secondary | ICD-10-CM

## 2020-07-21 DIAGNOSIS — H353221 Exudative age-related macular degeneration, left eye, with active choroidal neovascularization: Secondary | ICD-10-CM

## 2020-07-21 DIAGNOSIS — H43813 Vitreous degeneration, bilateral: Secondary | ICD-10-CM | POA: Diagnosis not present

## 2020-07-21 DIAGNOSIS — Z961 Presence of intraocular lens: Secondary | ICD-10-CM | POA: Diagnosis not present

## 2020-07-21 DIAGNOSIS — H3581 Retinal edema: Secondary | ICD-10-CM | POA: Diagnosis not present

## 2020-07-21 DIAGNOSIS — H02003 Unspecified entropion of right eye, unspecified eyelid: Secondary | ICD-10-CM

## 2020-07-22 ENCOUNTER — Encounter (INDEPENDENT_AMBULATORY_CARE_PROVIDER_SITE_OTHER): Payer: Self-pay | Admitting: Ophthalmology

## 2020-07-22 DIAGNOSIS — H43813 Vitreous degeneration, bilateral: Secondary | ICD-10-CM | POA: Diagnosis not present

## 2020-07-22 DIAGNOSIS — Z961 Presence of intraocular lens: Secondary | ICD-10-CM | POA: Diagnosis not present

## 2020-07-22 DIAGNOSIS — H353221 Exudative age-related macular degeneration, left eye, with active choroidal neovascularization: Secondary | ICD-10-CM

## 2020-07-22 MED ORDER — AFLIBERCEPT 2MG/0.05ML IZ SOLN FOR KALEIDOSCOPE
2.0000 mg | INTRAVITREAL | Status: AC | PRN
  Administered 2020-07-22: 2 mg via INTRAVITREAL

## 2020-09-05 DIAGNOSIS — I10 Essential (primary) hypertension: Secondary | ICD-10-CM | POA: Diagnosis not present

## 2020-09-05 DIAGNOSIS — E1129 Type 2 diabetes mellitus with other diabetic kidney complication: Secondary | ICD-10-CM | POA: Diagnosis not present

## 2020-09-05 DIAGNOSIS — N183 Chronic kidney disease, stage 3 unspecified: Secondary | ICD-10-CM | POA: Diagnosis not present

## 2020-09-05 DIAGNOSIS — Z79899 Other long term (current) drug therapy: Secondary | ICD-10-CM | POA: Diagnosis not present

## 2020-09-05 DIAGNOSIS — E785 Hyperlipidemia, unspecified: Secondary | ICD-10-CM | POA: Diagnosis not present

## 2020-09-05 NOTE — Progress Notes (Shared)
[facility] Note  09/12/2020     CHIEF COMPLAINT Patient presents for No chief complaint on file.   HISTORY OF PRESENT ILLNESS: [name] is a 87 y.o. female who presents to the clinic today for:   pt states her right eye is watering a lot today, she states it feels like it's scratched and is very light sensitive today  Referring physician: [name], MD [address] [place],  [place] [address]  HISTORICAL INFORMATION:   Selected notes from the [facility] Referred by Dr. [name] for concern of exudative AMD LEE- 02.27.19 ([facility]. [facility]) [BCVA OD: 20/40 OS: 20/70-] Ocular Hx- pseudophakia OU, lid sx (Shapiro x 24 yrs ago) PMH- DM, HTN    CURRENT MEDICATIONS: No current outpatient medications on file. (Ophthalmic Drugs)   Current Facility-Administered Medications (Ophthalmic Drugs)  Medication Route  . aflibercept (EYLEA) SOLN 2 mg Intravitreal  . aflibercept (EYLEA) SOLN 2 mg Intravitreal  . aflibercept (EYLEA) SOLN 2 mg Intravitreal  . aflibercept (EYLEA) SOLN 2 mg Intravitreal  . aflibercept (EYLEA) SOLN 2 mg Intravitreal  . aflibercept (EYLEA) SOLN 2 mg Intravitreal   Current Outpatient Medications (Other)  Medication Sig  . ACCU-CHEK AVIVA PLUS test strip daily.  Marland Kitchen acetaminophen (TYLENOL) 500 MG tablet Take 500 mg by mouth every 6 (six) hours as needed. For arthritis pain  . cephALEXin (KEFLEX) 500 MG capsule Take 1 capsule (500 mg total) by mouth 3 (three) times daily.  Marland Kitchen gabapentin (NEURONTIN) 100 MG capsule Take 100 mg by mouth daily.  . hydrochlorothiazide (HYDRODIURIL) 25 MG tablet Take 25 mg by mouth daily.  . insulin glargine (LANTUS) 100 UNIT/ML injection Inject 35 Units into the skin daily.   . insulin glargine (LANTUS) 100 UNIT/ML injection   . losartan (COZAAR) 100 MG tablet Take 100 mg by mouth daily.  Marland Kitchen losartan-hydrochlorothiazide (HYZAAR) 100-25 MG per tablet Take 1 tablet by mouth daily.  .  metFORMIN (GLUCOPHAGE) 500 MG tablet Take 500 mg by mouth daily.  . metFORMIN (GLUCOPHAGE-XR) 500 MG 24 hr tablet   . olmesartan (BENICAR) 20 MG tablet Take 20 mg by mouth daily.  . rosuvastatin (CRESTOR) 20 MG tablet Take by mouth.  Haig Prophet COMFORT INS SYR .5CC/30G 30G X 5/16" 0.5 ML MISC USE AS DIRECTED WITHOLANTUS.   Current Facility-Administered Medications (Other)  Medication Route  . Bevacizumab (AVASTIN) SOLN 1.25 mg Intravitreal  . Bevacizumab (AVASTIN) SOLN 1.25 mg Intravitreal  . Bevacizumab (AVASTIN) SOLN 1.25 mg Intravitreal      REVIEW OF SYSTEMS:    ALLERGIES No Known Allergies  PAST MEDICAL HISTORY Past Medical History:  Diagnosis Date  . Arthritis   . Back pain 01/30/2015  . Diabetes mellitus without complication ([place])   . Diabetic retinopathy ([place])    NPDR OU  . Hematuria 12/25/2014  . Hypertension   . Macular degeneration    Dry OD, Wet OS  . PONV (postoperative nausea and vomiting)   . Urinary urgency 12/25/2014  . UTI (lower urinary tract infection) 11/02/2013   Past Surgical History:  Procedure Laterality Date  . ABDOMINAL HYSTERECTOMY  1960s   with bladder tack up  . BREAST SURGERY     benign, right  . C-EYE SURGERY PROCEDURE    . CATARACT EXTRACTION W/PHACO  02/08/2012   Procedure: CATARACT EXTRACTION PHACO AND INTRAOCULAR LENS PLACEMENT (IOC);  Surgeon: Elta Guadeloupe T. Gershon Crane, MD;  Location: [facility];  Service: Ophthalmology;  Laterality: Left;  CDE:11.62  .  EYE SURGERY Bilateral    Cat Sx  . RECTOCELE REPAIR  1985   [facility], [place]    FAMILY HISTORY Family History  Problem Relation Age of Onset  . Diabetes Mother   . Diabetes Brother   . Diabetes Brother   . Heart attack Brother   . Cancer Brother        lung  . Cancer Brother        lung  . Birth defects Brother        passed away at age 60; hole in heart  . Emphysema Brother     SOCIAL HISTORY Social History   Tobacco Use  . Smoking status: Never Smoker  . Smokeless tobacco: Never  Used  Vaping Use  . Vaping Use: Never used  Substance Use Topics  . Alcohol use: No  . Drug use: No         OPHTHALMIC EXAM:  Not recorded     IMAGING AND PROCEDURES  Imaging and Procedures for 08/17/17           ASSESSMENT/PLAN:    ICD-10-CM   1. Exudative age-related macular degeneration of left eye with active choroidal neovascularization ([place])  H35.3221   2. Intermediate stage nonexudative age-related macular degeneration of right eye  H35.3112   3. Mild nonproliferative diabetic retinopathy of both eyes without macular edema associated with type 2 diabetes mellitus ([place])  T61.4431   4. Retinal edema  H35.81   5. Posterior vitreous detachment of both eyes  H43.813   6. Pseudophakia of both eyes  Z96.1     1. Exudative age related macular degeneration, OS    - S/P IVA OS #1 (03.05.19), #2 (04.03.19), #3 (05.01.19), #4 (01.06.21) -- switched to Eylea due to poor response  - Good Days approved in 2020  - S/P IVE OS #1 (05.29.19), #2 (06.26.19), #3 (07.31.19), #4 (09.11.19), #5 (11.14.19), #6 (01.24.20), #7 (05.06.20), #8 (09.16.20), #9 (04.12.21), #10 (08.13.21), #11 (03.04.22), #12 (04.04.22)  - BCVA stable at 20/60 OS   - OCT with Interval improvement / resolution of IRF/SRF/IRHM  - recommend IVE OS #13 today, 05.27.22 -- maintenance w/ ext to 6 wks  - pt in agreement   - Eylea informed consent form signed and scanned on 03.04.22  - Eylea4U benefits investigation started 01.06.21 -- approved for 2022  - f/u 6 weeks -- DFE/OCT/possible injxn   2. Age related macular degeneration, non-exudative, OD  - The incidence, anatomy, and pathology of dry AMD, risk of progression, and the AREDS and AREDS 2 study including smoking risks discussed with patient.  - stable  - continue amsler grid monitoring   3. Mild nonproliferative diabetic retinopathy w/o DME, both eyes  - exam with rare IRH   - pt unable to cooperate with fluorescencein angiogram  4. No retinal edema  on exam or OCT  5. PVD / vitreous syneresis OU  - Discussed findings and prognosis  - No RT or RD on 360 peripheral exam  - Reviewed s/s of RT/RD  - Strict return precautions for any such RT/RD signs/symptoms  6. Pseudophakia OU  - s/p CE/IOL OU  - beautiful surgeries, doing well  - monitor   Ophthalmic Meds Ordered this visit:  No orders of the defined types were placed in this encounter.      No follow-ups on file.  There are no Patient Instructions on file for this visit.  This document serves as a record of services personally performed by Gardiner Sleeper,  MD, PhD. It was created on their behalf by Leonie Douglas, an ophthalmic technician. The creation of this record is the provider's dictation and/or activities during the visit.    Electronically signed by: Leonie Douglas COA, 09/05/20  10:35 AM  Gardiner Sleeper, M.D., Ph.D. Diseases & Surgery of the Retina and Vitreous [facility] 09/12/2020    Abbreviations: M myopia (nearsighted); A astigmatism; H hyperopia (farsighted); P presbyopia; Mrx spectacle prescription;  CTL contact lenses; OD right eye; OS left eye; OU both eyes  XT exotropia; ET esotropia; PEK punctate epithelial keratitis; PEE punctate epithelial erosions; DES dry eye syndrome; MGD meibomian gland dysfunction; ATs artificial tears; PFAT's preservative free artificial tears; [place] nuclear sclerotic cataract; PSC posterior subcapsular cataract; ERM epi-retinal membrane; PVD posterior vitreous detachment; RD retinal detachment; DM diabetes mellitus; DR diabetic retinopathy; NPDR non-proliferative diabetic retinopathy; PDR proliferative diabetic retinopathy; CSME clinically significant macular edema; DME diabetic macular edema; dbh dot blot hemorrhages; CWS cotton wool spot; POAG primary open angle glaucoma; C/D cup-to-disc ratio; HVF humphrey visual field; GVF goldmann visual field; OCT optical coherence tomography; IOP intraocular pressure; BRVO  Branch retinal vein occlusion; CRVO central retinal vein occlusion; CRAO central retinal artery occlusion; BRAO branch retinal artery occlusion; RT retinal tear; SB scleral buckle; PPV pars plana vitrectomy; VH Vitreous hemorrhage; PRP panretinal laser photocoagulation; IVK intravitreal kenalog; VMT vitreomacular traction; MH Macular hole;  NVD neovascularization of the disc; NVE neovascularization elsewhere; AREDS age related eye disease study; ARMD age related macular degeneration; POAG primary open angle glaucoma; EBMD epithelial/anterior basement membrane dystrophy; ACIOL anterior chamber intraocular lens; IOL intraocular lens; PCIOL posterior chamber intraocular lens; Phaco/IOL phacoemulsification with intraocular lens placement; [place] photorefractive keratectomy; LASIK laser assisted in situ keratomileusis; HTN hypertension; DM diabetes mellitus; COPD chronic obstructive pulmonary disease

## 2020-09-11 DIAGNOSIS — E559 Vitamin D deficiency, unspecified: Secondary | ICD-10-CM | POA: Diagnosis not present

## 2020-09-11 DIAGNOSIS — N1831 Chronic kidney disease, stage 3a: Secondary | ICD-10-CM | POA: Diagnosis not present

## 2020-09-11 DIAGNOSIS — I10 Essential (primary) hypertension: Secondary | ICD-10-CM | POA: Diagnosis not present

## 2020-09-11 DIAGNOSIS — E1122 Type 2 diabetes mellitus with diabetic chronic kidney disease: Secondary | ICD-10-CM | POA: Diagnosis not present

## 2020-09-11 DIAGNOSIS — R7309 Other abnormal glucose: Secondary | ICD-10-CM | POA: Diagnosis not present

## 2020-09-12 ENCOUNTER — Encounter (INDEPENDENT_AMBULATORY_CARE_PROVIDER_SITE_OTHER): Payer: Medicare Other | Admitting: Ophthalmology

## 2020-09-12 DIAGNOSIS — E113293 Type 2 diabetes mellitus with mild nonproliferative diabetic retinopathy without macular edema, bilateral: Secondary | ICD-10-CM

## 2020-09-12 DIAGNOSIS — H353221 Exudative age-related macular degeneration, left eye, with active choroidal neovascularization: Secondary | ICD-10-CM

## 2020-09-12 DIAGNOSIS — H353112 Nonexudative age-related macular degeneration, right eye, intermediate dry stage: Secondary | ICD-10-CM

## 2020-09-12 DIAGNOSIS — Z961 Presence of intraocular lens: Secondary | ICD-10-CM

## 2020-09-12 DIAGNOSIS — H3581 Retinal edema: Secondary | ICD-10-CM

## 2020-09-12 DIAGNOSIS — H43813 Vitreous degeneration, bilateral: Secondary | ICD-10-CM

## 2020-10-07 NOTE — Progress Notes (Addendum)
[facility] Note  10/08/2020     CHIEF COMPLAINT Patient presents for Retina Follow Up   HISTORY OF PRESENT ILLNESS: [name] is a 87 y.o. female who presents to the clinic today for:   HPI     Retina Follow Up   Patient presents with  Wet AMD.  In left eye.  Severity is moderate.  Since onset it is stable.  I, the attending physician,  performed the HPI with the patient and updated documentation appropriately.        Comments   Pt states vision doing well, uses Refresh occasionally for dryness in the morning, denies fol or floaters      Last edited by [name], MD on 10/08/2020 10:36 PM.    pt states    Referring physician: Leticia Clas, [address] [facility] [address] [place],  [place] [address]  HISTORICAL INFORMATION:   Selected notes from the MEDICAL RECORD NUMBER Referred by Dr. [name] for concern of exudative AMD LEE- 02.27.19 (W. [facility]) [BCVA OD: 20/40 OS: 20/70-] Ocular Hx- pseudophakia OU, lid sx (Shapiro x 24 yrs ago) PMH- DM, HTN    CURRENT MEDICATIONS: No current outpatient medications on file. (Ophthalmic Drugs)   Current Facility-Administered Medications (Ophthalmic Drugs)  Medication Route   aflibercept (EYLEA) SOLN 2 mg Intravitreal   aflibercept (EYLEA) SOLN 2 mg Intravitreal   aflibercept (EYLEA) SOLN 2 mg Intravitreal   aflibercept (EYLEA) SOLN 2 mg Intravitreal   aflibercept (EYLEA) SOLN 2 mg Intravitreal   aflibercept (EYLEA) SOLN 2 mg Intravitreal   Current Outpatient Medications (Other)  Medication Sig   ACCU-CHEK AVIVA PLUS test strip daily.   acetaminophen (TYLENOL) 500 MG tablet Take 500 mg by mouth every 6 (six) hours as needed. For arthritis pain   cephALEXin (KEFLEX) 500 MG capsule Take 1 capsule (500 mg total) by mouth 3 (three) times daily.   gabapentin (NEURONTIN) 100 MG capsule Take 100 mg by mouth daily.   hydrochlorothiazide (HYDRODIURIL) 25 MG tablet Take 25 mg by mouth daily.    insulin glargine (LANTUS) 100 UNIT/ML injection Inject 35 Units into the skin daily.    insulin glargine (LANTUS) 100 UNIT/ML injection    losartan (COZAAR) 100 MG tablet Take 100 mg by mouth daily.   losartan-hydrochlorothiazide (HYZAAR) 100-25 MG per tablet Take 1 tablet by mouth daily.   metFORMIN (GLUCOPHAGE) 500 MG tablet Take 500 mg by mouth daily.   metFORMIN (GLUCOPHAGE-XR) 500 MG 24 hr tablet    olmesartan (BENICAR) 20 MG tablet Take 20 mg by mouth daily.   rosuvastatin (CRESTOR) 20 MG tablet Take by mouth.   SURE COMFORT INS SYR .5CC/30G 30G X 5/16" 0.5 ML MISC USE AS DIRECTED WITHOLANTUS.   Current Facility-Administered Medications (Other)  Medication Route   Bevacizumab (AVASTIN) SOLN 1.25 mg Intravitreal   Bevacizumab (AVASTIN) SOLN 1.25 mg Intravitreal   Bevacizumab (AVASTIN) SOLN 1.25 mg Intravitreal      REVIEW OF SYSTEMS: ROS   Positive for: Endocrine, Cardiovascular, Eyes Negative for: Constitutional, Gastrointestinal, Neurological, Skin, Genitourinary, Musculoskeletal, HENT, Respiratory, Psychiatric, Allergic/Imm, Heme/Lymph Last edited by Debbrah Alar, COT on 10/08/2020  1:16 PM.        ALLERGIES No Known Allergies  PAST MEDICAL HISTORY Past Medical History:  Diagnosis Date   Arthritis    Back pain 01/30/2015   Diabetes mellitus without complication ([place])    Diabetic retinopathy ([place])    NPDR OU   Hematuria 12/25/2014  Hypertension    Macular degeneration    Dry OD, Wet OS   PONV (postoperative nausea and vomiting)    Urinary urgency 12/25/2014   UTI (lower urinary tract infection) 11/02/2013   Past Surgical History:  Procedure Laterality Date   ABDOMINAL HYSTERECTOMY  1960s   with bladder tack up   BREAST SURGERY     benign, right   C-EYE SURGERY PROCEDURE     CATARACT EXTRACTION W/PHACO  02/08/2012   Procedure: CATARACT EXTRACTION PHACO AND INTRAOCULAR LENS PLACEMENT ([place]);  Surgeon: Elta Guadeloupe T. Gershon Crane, MD;  Location: [facility];  Service:  Ophthalmology;  Laterality: Left;  CDE:11.62   EYE SURGERY Bilateral    Cat Sx   RECTOCELE REPAIR  1985   [facility], [place]    FAMILY HISTORY Family History  Problem Relation Age of Onset   Diabetes Mother    Diabetes Brother    Diabetes Brother    Heart attack Brother    Cancer Brother        lung   Cancer Brother        lung   Birth defects Brother        passed away at age 41; hole in heart   Emphysema Brother     SOCIAL HISTORY Social History   Tobacco Use   Smoking status: Never   Smokeless tobacco: Never  Vaping Use   Vaping Use: Never used  Substance Use Topics   Alcohol use: No   Drug use: No         OPHTHALMIC EXAM:  Base Eye Exam     Visual Acuity (Snellen - Linear)       Right Left   Dist [place] 20/50 20/50 -2   Dist ph [place] 20/50 +2 20/50 +2         Tonometry (Tonopen, 1:24 PM)       Right Left   Pressure 18 18         Pupils       Dark Light Shape React APD   Right 2 1 Round Minimal None   Left 2 1 Round Minimal None         Visual Fields (Counting fingers)       Left Right    Full Full         Extraocular Movement       Right Left    Full, Ortho Full, Ortho         Neuro/Psych     Oriented x3: Yes   Mood/Affect: Normal         Dilation     Both eyes: 1.0% Mydriacyl, 2.5% Phenylephrine @ 1:24 PM           Slit Lamp and Fundus Exam     Slit Lamp Exam       Right Left   Lids/Lashes Dermatochalasis - upper lid, Meibomian gland dysfunction Dermatochalasis - upper lid, Ptosis   Conjunctiva/Sclera White and quiet Inferior Conjunctivochalasis   Cornea Arcus, 1+Punctate epithelial erosions, Debris in tear film Arcus, 2+ inferior Punctate epithelial erosions   Anterior Chamber Deep and quiet Deep and quiet   [place] Patent peripheral iridectomy at 0900, 360 peripapillary Transillumination defects at 0430 and 0800 to 0900 Round and dilated   Lens PC IOL in good position, 1+ Posterior capsular opacification PC IOL in  good position, 1+ non-central Posterior capsular opacification   Vitreous Posterior vitreous detachment Posterior vitreous detachment, Vitreous syneresis         Fundus  Exam       Right Left   Disc Temporal Peripapillary atrophy and pigmenation, Sharp rim, mild Pallor 360 Peripapillary atrophy, 2-3+Pallor, Sharp rim   C/D Ratio 0.2 0.4   Macula Flat, Blunted foveal reflex, RPE mottling and clumping, Drusen, RPE atrophy nasal to fovea, No heme or edema +CNVM with stable resolution of central edema, +punctate hemes - improved, Blunted foveal reflex, Drusen, RPE atrophy, mottling and clumping, early sub-retinal fibrosis   Vessels attenuated, mild tortuousity Vascular attenuation   Periphery Attached, mild peripheral drusen, Reticular degeneration, No heme  Attached, mild peripheral drusen, mild reticular degeneration, No heme             IMAGING AND PROCEDURES  Imaging and Procedures for 08/17/17  OCT, Retina - OU - Both Eyes       Right Eye Quality was good. Central Foveal Thickness: 180. Progression has been stable. Findings include normal foveal contour, no IRF, no SRF, epiretinal membrane, outer retinal atrophy, retinal drusen (Thin choroid; focal ORA nasal macula).   Left Eye Quality was good. Central Foveal Thickness: 259. Progression has improved. Findings include retinal drusen , subretinal hyper-reflective material, pigment epithelial detachment, outer retinal atrophy, epiretinal membrane, normal foveal contour, no IRF, no SRF (stable improvement / resolution of IRF/SRF/SRHM).   Notes *Images captured and stored on drive  Diagnosis / Impression:  No DME OU Non-Exudative ARMD OD Exudative ARMD OS -- stable improvement / resolution of IRF/SRF/IRHM  Clinical management:  See below  Abbreviations: NFP - Normal foveal profile. CME - cystoid macular edema. PED - pigment epithelial detachment. IRF - intraretinal fluid. SRF - subretinal fluid. EZ - ellipsoid zone. ERM -  epiretinal membrane. ORA - outer retinal atrophy. ORT - outer retinal tubulation. SRHM - subretinal hyper-reflective material       Intravitreal Injection, Pharmacologic Agent - OS - Left Eye       Time Out 10/08/2020. 2:18 PM. Confirmed correct patient, procedure, site, and patient consented.   Anesthesia Topical anesthesia was used. Anesthetic medications included Lidocaine 2%, Proparacaine 0.5%.   Procedure Preparation included eyelid speculum, 5% betadine to ocular surface. A (32g) needle was used.   Injection: 2 mg aflibercept 2 MG/0.05ML   Route: Intravitreal, Site: Left Eye   NDC: A3590391, Lot: [id number], Expiration date: 06/16/2021, Waste: 0.05 mL   Post-op Post injection exam found visual acuity of at least counting fingers. The patient tolerated the procedure well. There were no complications. The patient received written and verbal post procedure care education. Post injection medications were not given.            ASSESSMENT/PLAN:    ICD-10-CM   1. Exudative age-related macular degeneration of left eye with active choroidal neovascularization (HCC)  H35.3221 Intravitreal Injection, Pharmacologic Agent - OS - Left Eye    aflibercept (EYLEA) SOLN 2 mg    2. Intermediate stage nonexudative age-related macular degeneration of right eye  H35.3112     3. Mild nonproliferative diabetic retinopathy of both eyes without macular edema associated with type 2 diabetes mellitus ([place])  R00.7622     4. Retinal edema  H35.81 OCT, Retina - OU - Both Eyes    5. Posterior vitreous detachment of both eyes  H43.813     6. Pseudophakia of both eyes  Z96.1      1. Exudative age related macular degeneration, OS    - S/P IVA OS #1 (03.05.19), #2 (04.03.19), #3 (05.01.19), #4 (01.06.21) -- switched to Eylea due to poor response  -  Good Days approved in 2020  - S/P IVE OS #1 (05.29.19), #2 (06.26.19), #3 (07.31.19), #4 (09.11.19), #5 (11.14.19), #6 (01.24.20), #7 (05.06.20),  #8 (09.16.20), #9 (04.12.21), #10 (08.13.21), #11 (03.04.22), #12 (04.04.22)  - BCVA improved to 20/50+2 OS   - OCT with stable improvement / resolution of IRF/SRF/SRHM  - recommend IVE OS #13 today, 06.22.22 -- maintenance w/ ext to 12 wks  - pt in agreement   - Eylea informed consent form signed and scanned on 03.04.22  - Eylea4U benefits investigation started 01.06.21 -- approved for 2022  - f/u 12 weeks -- DFE/OCT/possible injxn   2. Age related macular degeneration, non-exudative, OD  - The incidence, anatomy, and pathology of dry AMD, risk of progression, and the AREDS and AREDS 2 study including smoking risks discussed with patient.  - stable  - continue amsler grid monitoring  3. Mild nonproliferative diabetic retinopathy w/o DME, both eyes  - exam with rare [facility]   - patient unable to cooperate with fluorescein angiogram  4. No retinal edema on exam or OCT  5. PVD / vitreous syneresis OU  - Discussed findings and prognosis  - No RT or RD on 360 peripheral exam  - Reviewed s/s of RT/RD  - strict return precautions for any such RT/RD signs/symptoms  6. Pseudophakia OU  - s/p CE/IOL OU  - beautiful surgeries, doing well  - monitor   Ophthalmic Meds Ordered this visit:  Meds ordered this encounter  Medications   aflibercept (EYLEA) SOLN 2 mg        Return in about 12 weeks (around 12/31/2020) for f/u exu ARMD OS, DFE, OCT.  There are no Patient Instructions on file for this visit.  This document serves as a record of services personally performed by Gardiner Sleeper, MD, PhD. It was created on their behalf by Roselee Nova, COMT. The creation of this record is the provider's dictation and/or activities during the visit.  Electronically signed by: Roselee Nova, COMT 10/08/20 10:45 PM  This document serves as a record of services personally performed by Gardiner Sleeper, MD, PhD. It was created on their behalf by San Jetty. Owens Shark, OA an ophthalmic technician. The creation  of this record is the provider's dictation and/or activities during the visit.    Electronically signed by: San Jetty. Owens Shark, [place] 06.22.2022 10:45 PM   Gardiner Sleeper, M.D., Ph.D. Diseases & Surgery of the Retina and Vitreous [facility]  I have reviewed the above documentation for accuracy and completeness, and I agree with the above. Gardiner Sleeper, M.D., Ph.D. 10/08/20 10:45 PM   Abbreviations: M myopia (nearsighted); A astigmatism; H hyperopia (farsighted); P presbyopia; Mrx spectacle prescription;  CTL contact lenses; OD right eye; OS left eye; OU both eyes  XT exotropia; ET esotropia; PEK punctate epithelial keratitis; PEE punctate epithelial erosions; DES dry eye syndrome; MGD meibomian gland dysfunction; ATs artificial tears; PFAT's preservative free artificial tears; [place] nuclear sclerotic cataract; PSC posterior subcapsular cataract; ERM epi-retinal membrane; PVD posterior vitreous detachment; RD retinal detachment; DM diabetes mellitus; DR diabetic retinopathy; NPDR non-proliferative diabetic retinopathy; PDR proliferative diabetic retinopathy; CSME clinically significant macular edema; DME diabetic macular edema; dbh dot blot hemorrhages; CWS cotton wool spot; POAG primary open angle glaucoma; C/D cup-to-disc ratio; HVF humphrey visual field; GVF goldmann visual field; OCT optical coherence tomography; IOP intraocular pressure; BRVO Branch retinal vein occlusion; CRVO central retinal vein occlusion; CRAO central retinal artery occlusion; BRAO branch retinal artery occlusion; RT  retinal tear; SB scleral buckle; PPV pars plana vitrectomy; VH Vitreous hemorrhage; PRP panretinal laser photocoagulation; IVK intravitreal kenalog; VMT vitreomacular traction; MH Macular hole;  NVD neovascularization of the disc; NVE neovascularization elsewhere; AREDS age related eye disease study; ARMD age related macular degeneration; POAG primary open angle glaucoma; EBMD epithelial/anterior  basement membrane dystrophy; ACIOL anterior chamber intraocular lens; IOL intraocular lens; PCIOL posterior chamber intraocular lens; Phaco/IOL phacoemulsification with intraocular lens placement; [place] photorefractive keratectomy; LASIK laser assisted in situ keratomileusis; HTN hypertension; DM diabetes mellitus; COPD chronic obstructive pulmonary disease

## 2020-10-08 ENCOUNTER — Encounter (INDEPENDENT_AMBULATORY_CARE_PROVIDER_SITE_OTHER): Payer: Self-pay | Admitting: Ophthalmology

## 2020-10-08 ENCOUNTER — Ambulatory Visit (INDEPENDENT_AMBULATORY_CARE_PROVIDER_SITE_OTHER): Payer: Medicare Other | Admitting: Ophthalmology

## 2020-10-08 ENCOUNTER — Other Ambulatory Visit: Payer: Self-pay

## 2020-10-08 DIAGNOSIS — H3581 Retinal edema: Secondary | ICD-10-CM | POA: Diagnosis not present

## 2020-10-08 DIAGNOSIS — H353221 Exudative age-related macular degeneration, left eye, with active choroidal neovascularization: Secondary | ICD-10-CM | POA: Diagnosis not present

## 2020-10-08 DIAGNOSIS — E113293 Type 2 diabetes mellitus with mild nonproliferative diabetic retinopathy without macular edema, bilateral: Secondary | ICD-10-CM

## 2020-10-08 DIAGNOSIS — H353112 Nonexudative age-related macular degeneration, right eye, intermediate dry stage: Secondary | ICD-10-CM

## 2020-10-08 DIAGNOSIS — H43813 Vitreous degeneration, bilateral: Secondary | ICD-10-CM

## 2020-10-08 DIAGNOSIS — Z961 Presence of intraocular lens: Secondary | ICD-10-CM

## 2020-10-08 MED ORDER — AFLIBERCEPT 2MG/0.05ML IZ SOLN FOR KALEIDOSCOPE
2.0000 mg | INTRAVITREAL | Status: AC | PRN
  Administered 2020-10-08: 14:00:00 2 mg via INTRAVITREAL

## 2020-12-11 DIAGNOSIS — I10 Essential (primary) hypertension: Secondary | ICD-10-CM | POA: Diagnosis not present

## 2020-12-11 DIAGNOSIS — Z79899 Other long term (current) drug therapy: Secondary | ICD-10-CM | POA: Diagnosis not present

## 2020-12-11 DIAGNOSIS — E559 Vitamin D deficiency, unspecified: Secondary | ICD-10-CM | POA: Diagnosis not present

## 2020-12-11 DIAGNOSIS — N1832 Chronic kidney disease, stage 3b: Secondary | ICD-10-CM | POA: Diagnosis not present

## 2020-12-11 DIAGNOSIS — E1129 Type 2 diabetes mellitus with other diabetic kidney complication: Secondary | ICD-10-CM | POA: Diagnosis not present

## 2020-12-17 DIAGNOSIS — N1831 Chronic kidney disease, stage 3a: Secondary | ICD-10-CM | POA: Diagnosis not present

## 2020-12-17 DIAGNOSIS — R7309 Other abnormal glucose: Secondary | ICD-10-CM | POA: Diagnosis not present

## 2020-12-17 DIAGNOSIS — E559 Vitamin D deficiency, unspecified: Secondary | ICD-10-CM | POA: Diagnosis not present

## 2020-12-17 DIAGNOSIS — E1122 Type 2 diabetes mellitus with diabetic chronic kidney disease: Secondary | ICD-10-CM | POA: Diagnosis not present

## 2020-12-17 DIAGNOSIS — I10 Essential (primary) hypertension: Secondary | ICD-10-CM | POA: Diagnosis not present

## 2020-12-26 NOTE — Progress Notes (Signed)
[facility] Note  12/31/2020     CHIEF COMPLAINT Patient presents for Retina Follow Up   HISTORY OF PRESENT ILLNESS: [name] is a 87 y.o. female who presents to the clinic today for:   HPI     Retina Follow Up   Patient presents with  Wet AMD.  Duration of 12 weeks.  Since onset it is stable.  I, the attending physician,  performed the HPI with the patient and updated documentation appropriately.        Comments   12 week follow up Exu ARMD OS- Eyes are doing good.  OD has been watering and the sunlight hurts her eye.  Her "floaters" have improved OS.  A1C 6.9  BS runs around 121 RUL/RLL is red today.  Patient states it was swollen but has went down a lot.      Last edited by [name], MD on 01/04/2021  1:28 AM.    Pt states she can see better today   Referring physician: [name], MD [address] [place],  [place] [address]  HISTORICAL INFORMATION:   Selected notes from the MEDICAL RECORD NUMBER Referred by Dr. [name] for concern of exudative AMD LEE- 02.27.19 (W. [facility]) [BCVA OD: 20/40 OS: 20/70-] Ocular Hx- pseudophakia OU, lid sx (Shapiro x 24 yrs ago) PMH- DM, HTN    CURRENT MEDICATIONS: No current outpatient medications on file. (Ophthalmic Drugs)   Current Facility-Administered Medications (Ophthalmic Drugs)  Medication Route   aflibercept (EYLEA) SOLN 2 mg Intravitreal   aflibercept (EYLEA) SOLN 2 mg Intravitreal   aflibercept (EYLEA) SOLN 2 mg Intravitreal   aflibercept (EYLEA) SOLN 2 mg Intravitreal   aflibercept (EYLEA) SOLN 2 mg Intravitreal   aflibercept (EYLEA) SOLN 2 mg Intravitreal   Current Outpatient Medications (Other)  Medication Sig   acetaminophen (TYLENOL) 500 MG tablet Take 500 mg by mouth every 6 (six) hours as needed. For arthritis pain   gabapentin (NEURONTIN) 100 MG capsule Take 100 mg by mouth daily.   hydrochlorothiazide (HYDRODIURIL) 25 MG tablet Take 25 mg by mouth daily.    insulin glargine (LANTUS) 100 UNIT/ML injection Inject 35 Units into the skin daily.    losartan (COZAAR) 100 MG tablet Take 100 mg by mouth daily.   losartan-hydrochlorothiazide (HYZAAR) 100-25 MG per tablet Take 1 tablet by mouth daily.   metFORMIN (GLUCOPHAGE) 500 MG tablet Take 500 mg by mouth daily.   olmesartan (BENICAR) 20 MG tablet Take 20 mg by mouth daily.   rosuvastatin (CRESTOR) 20 MG tablet Take by mouth.   ACCU-CHEK AVIVA PLUS test strip daily.   cephALEXin (KEFLEX) 500 MG capsule Take 1 capsule (500 mg total) by mouth 3 (three) times daily. (Patient not taking: Reported on 12/31/2020)   insulin glargine (LANTUS) 100 UNIT/ML injection    metFORMIN (GLUCOPHAGE-XR) 500 MG 24 hr tablet    SURE COMFORT INS SYR .5CC/30G 30G X 5/16" 0.5 ML MISC USE AS DIRECTED WITHOLANTUS.   Current Facility-Administered Medications (Other)  Medication Route   Bevacizumab (AVASTIN) SOLN 1.25 mg Intravitreal   Bevacizumab (AVASTIN) SOLN 1.25 mg Intravitreal   Bevacizumab (AVASTIN) SOLN 1.25 mg Intravitreal      REVIEW OF SYSTEMS: ROS   Positive for: Endocrine, Cardiovascular, Eyes Negative for: Constitutional, Gastrointestinal, Neurological, Skin, Genitourinary, Musculoskeletal, HENT, Respiratory, Psychiatric, Allergic/Imm, Heme/Lymph Last edited by Leonie Douglas, COA on 12/31/2020  1:29 PM.         ALLERGIES No Known Allergies  PAST  MEDICAL HISTORY Past Medical History:  Diagnosis Date   Arthritis    Back pain 01/30/2015   Diabetes mellitus without complication ([place])    Diabetic retinopathy ([place])    NPDR OU   Hematuria 12/25/2014   Hypertension    Macular degeneration    Dry OD, Wet OS   PONV (postoperative nausea and vomiting)    Urinary urgency 12/25/2014   UTI (lower urinary tract infection) 11/02/2013   Past Surgical History:  Procedure Laterality Date   ABDOMINAL HYSTERECTOMY  1960s   with bladder tack up   BREAST SURGERY     benign, right   C-EYE SURGERY PROCEDURE      CATARACT EXTRACTION W/PHACO  02/08/2012   Procedure: CATARACT EXTRACTION PHACO AND INTRAOCULAR LENS PLACEMENT ([place]);  Surgeon: Elta Guadeloupe T. Gershon Crane, MD;  Location: [facility];  Service: Ophthalmology;  Laterality: Left;  CDE:11.62   EYE SURGERY Bilateral    Cat Sx   RECTOCELE REPAIR  1985   [facility], [place]    FAMILY HISTORY Family History  Problem Relation Age of Onset   Diabetes Mother    Diabetes Brother    Diabetes Brother    Heart attack Brother    Cancer Brother        lung   Cancer Brother        lung   Birth defects Brother        passed away at age 73; hole in heart   Emphysema Brother     SOCIAL HISTORY Social History   Tobacco Use   Smoking status: Never   Smokeless tobacco: Never  Vaping Use   Vaping Use: Never used  Substance Use Topics   Alcohol use: No   Drug use: No         OPHTHALMIC EXAM:  Base Eye Exam     Visual Acuity (Snellen - Linear)       Right Left   Dist [place] 20/60 -2 20/50   Dist ph [place] 20/60 +2 NI         Tonometry (Tonopen, 1:37 PM)       Right Left   Pressure 19 17         Pupils       Dark Light Shape React APD   Right 2 1 Round Minimal None   Left 2 1 Round Minimal None         Visual Fields (Counting fingers)       Left Right    Full Full         Extraocular Movement       Right Left    Full Full         Neuro/Psych     Oriented x3: Yes   Mood/Affect: Normal         Dilation     Both eyes: 1.0% Mydriacyl, 2.5% Phenylephrine @ 1:37 PM           Slit Lamp and Fundus Exam     Slit Lamp Exam       Right Left   Lids/Lashes Dermatochalasis - upper lid, Meibomian gland dysfunction Dermatochalasis - upper lid, Ptosis   Conjunctiva/Sclera White and quiet Inferior Conjunctivochalasis   Cornea Arcus, 1+Punctate epithelial erosions, Debris in tear film Arcus, 2+ inferior Punctate epithelial erosions   Anterior Chamber Deep and quiet Deep and quiet   [place] Patent peripheral iridectomy at 0900, 360  peripapillary Transillumination defects at 0430 and 0800 to 0900 Round and dilated   Lens PC  IOL in good position, 1+ Posterior capsular opacification PC IOL in good position, 1+ non-central Posterior capsular opacification   Vitreous Posterior vitreous detachment Posterior vitreous detachment, Vitreous syneresis         Fundus Exam       Right Left   Disc Temporal Peripapillary atrophy and pigmenation, Sharp rim, 3+Pallor 360 Peripapillary atrophy, 2-3+Pallor, Sharp rim   C/D Ratio 0.2 0.4   Macula Flat, Blunted foveal reflex, RPE mottling and clumping, Drusen, RPE atrophy nasal to fovea, No heme or edema +CNVM with stable resolution of central edema, +punctate hemes - stably improved, Blunted foveal reflex, Drusen, RPE atrophy, mottling and clumping, early sub-retinal fibrosis, no heme   Vessels attenuated, mild tortuousity Vascular attenuation   Periphery Attached, mild peripheral drusen, Reticular degeneration, No heme  Attached, mild peripheral drusen, mild reticular degeneration, No heme             IMAGING AND PROCEDURES  Imaging and Procedures for 08/17/17  OCT, Retina - OU - Both Eyes       Right Eye Quality was good. Central Foveal Thickness: 180. Progression has been stable. Findings include normal foveal contour, no IRF, no SRF, epiretinal membrane, outer retinal atrophy, retinal drusen (Thin choroid; focal ORA nasal macula).   Left Eye Quality was good. Central Foveal Thickness: 202. Progression has improved. Findings include retinal drusen , subretinal hyper-reflective material, pigment epithelial detachment, outer retinal atrophy, epiretinal membrane, normal foveal contour, no IRF, no SRF (stable improvement / resolution of IRF/SRF/SRHM).   Notes *Images captured and stored on drive  Diagnosis / Impression:  No DME OU Non-Exudative ARMD OD Exudative ARMD OS -- stable improvement / resolution of IRF/SRF/IRHM  Clinical management:  See below  Abbreviations:  NFP - Normal foveal profile. CME - cystoid macular edema. PED - pigment epithelial detachment. IRF - intraretinal fluid. SRF - subretinal fluid. EZ - ellipsoid zone. ERM - epiretinal membrane. ORA - outer retinal atrophy. ORT - outer retinal tubulation. SRHM - subretinal hyper-reflective material             ASSESSMENT/PLAN:    ICD-10-CM   1. Exudative age-related macular degeneration of left eye with active choroidal neovascularization ([place])  H35.3221     2. Retinal edema  H35.81 OCT, Retina - OU - Both Eyes    3. Intermediate stage nonexudative age-related macular degeneration of right eye  H35.3112     4. Mild nonproliferative diabetic retinopathy of both eyes without macular edema associated with type 2 diabetes mellitus ([place])  [id number]     5. Posterior vitreous detachment of both eyes  H43.813     6. Pseudophakia of both eyes  Z96.1       1,2. Exudative age related macular degeneration, OS    - S/P IVA OS #1 (03.05.19), #2 (04.03.19), #3 (05.01.19), #4 (01.06.21) -- switched to Eylea due to poor response  - Good Days approved in 2020  - S/P IVE OS #1 (05.29.19), #2 (06.26.19), #3 (07.31.19), #4 (09.11.19), #5 (11.14.19), #6 (01.24.20), #7 (05.06.20), #8 (09.16.20), #9 (04.12.21), #10 (08.13.21), #11 (03.04.22), #12 (04.04.22), #13 (06.22.22)  - BCVA improved to 20/50+2 OS   - OCT with stable improvement / resolution of IRF/SRF/SRHM  - recommend holding OS injection today  - pt in agreement   - Eylea informed consent form signed and scanned on 03.04.22  - Eylea4U benefits investigation started 01.06.21 -- approved for 2022  - f/u 3 months -- DFE/OCT/possible injxn   3. Age related macular degeneration, non-exudative,  OD  - The incidence, anatomy, and pathology of dry AMD, risk of progression, and the AREDS and AREDS 2 study including smoking risks discussed with patient.  - stable  - continue amsler grid monitoring  4. Mild nonproliferative diabetic retinopathy w/o DME,  both eyes  - exam with rare [facility]   - patient unable to cooperate with fluorescein angiogram  5. PVD / vitreous syneresis OU  - Discussed findings and prognosis  - No RT or RD on 360 peripheral exam  - Reviewed s/s of RT/RD  - strict return precautions for any such RT/RD signs/symptoms  6. Pseudophakia OU  - s/p CE/IOL OU  - beautiful surgeries, doing well  - monitor   Ophthalmic Meds Ordered this visit:  No orders of the defined types were placed in this encounter.       Return in about 3 months (around 04/01/2021) for f/u exu ARMD OS, DFE, OCT.  There are no Patient Instructions on file for this visit.  This document serves as a record of services personally performed by Gardiner Sleeper, MD, PhD. It was created on their behalf by Orvan Falconer, an ophthalmic technician. The creation of this record is the provider's dictation and/or activities during the visit.    Electronically signed by: Orvan Falconer, OA, 01/04/21  1:31 AM   This document serves as a record of services personally performed by Gardiner Sleeper, MD, PhD. It was created on their behalf by San Jetty. Owens Shark, OA an ophthalmic technician. The creation of this record is the provider's dictation and/or activities during the visit.    Electronically signed by: San Jetty. Owens Shark, [place] 09.14.2022 1:31 AM  Gardiner Sleeper, M.D., Ph.D. Diseases & Surgery of the Retina and Vitreous [facility]  I have reviewed the above documentation for accuracy and completeness, and I agree with the above. Gardiner Sleeper, M.D., Ph.D. 01/04/21 1:31 AM   Abbreviations: M myopia (nearsighted); A astigmatism; H hyperopia (farsighted); P presbyopia; Mrx spectacle prescription;  CTL contact lenses; OD right eye; OS left eye; OU both eyes  XT exotropia; ET esotropia; PEK punctate epithelial keratitis; PEE punctate epithelial erosions; DES dry eye syndrome; MGD meibomian gland dysfunction; ATs artificial tears; PFAT's  preservative free artificial tears; [place] nuclear sclerotic cataract; PSC posterior subcapsular cataract; ERM epi-retinal membrane; PVD posterior vitreous detachment; RD retinal detachment; DM diabetes mellitus; DR diabetic retinopathy; NPDR non-proliferative diabetic retinopathy; PDR proliferative diabetic retinopathy; CSME clinically significant macular edema; DME diabetic macular edema; dbh dot blot hemorrhages; CWS cotton wool spot; POAG primary open angle glaucoma; C/D cup-to-disc ratio; HVF humphrey visual field; GVF goldmann visual field; OCT optical coherence tomography; IOP intraocular pressure; BRVO Branch retinal vein occlusion; CRVO central retinal vein occlusion; CRAO central retinal artery occlusion; BRAO branch retinal artery occlusion; RT retinal tear; SB scleral buckle; PPV pars plana vitrectomy; VH Vitreous hemorrhage; PRP panretinal laser photocoagulation; IVK intravitreal kenalog; VMT vitreomacular traction; MH Macular hole;  NVD neovascularization of the disc; NVE neovascularization elsewhere; AREDS age related eye disease study; ARMD age related macular degeneration; POAG primary open angle glaucoma; EBMD epithelial/anterior basement membrane dystrophy; ACIOL anterior chamber intraocular lens; IOL intraocular lens; PCIOL posterior chamber intraocular lens; Phaco/IOL phacoemulsification with intraocular lens placement; [place] photorefractive keratectomy; LASIK laser assisted in situ keratomileusis; HTN hypertension; DM diabetes mellitus; COPD chronic obstructive pulmonary disease

## 2020-12-31 ENCOUNTER — Other Ambulatory Visit: Payer: Self-pay

## 2020-12-31 ENCOUNTER — Ambulatory Visit (INDEPENDENT_AMBULATORY_CARE_PROVIDER_SITE_OTHER): Payer: Medicare Other | Admitting: Ophthalmology

## 2020-12-31 DIAGNOSIS — E113293 Type 2 diabetes mellitus with mild nonproliferative diabetic retinopathy without macular edema, bilateral: Secondary | ICD-10-CM | POA: Diagnosis not present

## 2020-12-31 DIAGNOSIS — H43813 Vitreous degeneration, bilateral: Secondary | ICD-10-CM

## 2020-12-31 DIAGNOSIS — H353112 Nonexudative age-related macular degeneration, right eye, intermediate dry stage: Secondary | ICD-10-CM

## 2020-12-31 DIAGNOSIS — H353221 Exudative age-related macular degeneration, left eye, with active choroidal neovascularization: Secondary | ICD-10-CM | POA: Diagnosis not present

## 2020-12-31 DIAGNOSIS — H3581 Retinal edema: Secondary | ICD-10-CM

## 2020-12-31 DIAGNOSIS — Z961 Presence of intraocular lens: Secondary | ICD-10-CM | POA: Diagnosis not present

## 2020-12-31 DIAGNOSIS — H02003 Unspecified entropion of right eye, unspecified eyelid: Secondary | ICD-10-CM

## 2021-01-04 ENCOUNTER — Encounter (INDEPENDENT_AMBULATORY_CARE_PROVIDER_SITE_OTHER): Payer: Self-pay | Admitting: Ophthalmology

## 2021-03-19 DIAGNOSIS — N1831 Chronic kidney disease, stage 3a: Secondary | ICD-10-CM | POA: Diagnosis not present

## 2021-03-19 DIAGNOSIS — Z79899 Other long term (current) drug therapy: Secondary | ICD-10-CM | POA: Diagnosis not present

## 2021-03-19 DIAGNOSIS — I10 Essential (primary) hypertension: Secondary | ICD-10-CM | POA: Diagnosis not present

## 2021-03-19 DIAGNOSIS — N321 Vesicointestinal fistula: Secondary | ICD-10-CM | POA: Diagnosis not present

## 2021-03-19 DIAGNOSIS — E1129 Type 2 diabetes mellitus with other diabetic kidney complication: Secondary | ICD-10-CM | POA: Diagnosis not present

## 2021-03-26 DIAGNOSIS — I10 Essential (primary) hypertension: Secondary | ICD-10-CM | POA: Diagnosis not present

## 2021-03-26 DIAGNOSIS — R7309 Other abnormal glucose: Secondary | ICD-10-CM | POA: Diagnosis not present

## 2021-03-26 DIAGNOSIS — N390 Urinary tract infection, site not specified: Secondary | ICD-10-CM | POA: Diagnosis not present

## 2021-03-26 DIAGNOSIS — E1122 Type 2 diabetes mellitus with diabetic chronic kidney disease: Secondary | ICD-10-CM | POA: Diagnosis not present

## 2021-03-26 DIAGNOSIS — E559 Vitamin D deficiency, unspecified: Secondary | ICD-10-CM | POA: Diagnosis not present

## 2021-03-26 DIAGNOSIS — N1831 Chronic kidney disease, stage 3a: Secondary | ICD-10-CM | POA: Diagnosis not present

## 2021-04-01 ENCOUNTER — Encounter (INDEPENDENT_AMBULATORY_CARE_PROVIDER_SITE_OTHER): Payer: Medicare Other | Admitting: Ophthalmology

## 2021-06-22 DIAGNOSIS — E1129 Type 2 diabetes mellitus with other diabetic kidney complication: Secondary | ICD-10-CM | POA: Diagnosis not present

## 2021-06-22 DIAGNOSIS — E559 Vitamin D deficiency, unspecified: Secondary | ICD-10-CM | POA: Diagnosis not present

## 2021-06-22 DIAGNOSIS — I10 Essential (primary) hypertension: Secondary | ICD-10-CM | POA: Diagnosis not present

## 2021-06-22 DIAGNOSIS — N1832 Chronic kidney disease, stage 3b: Secondary | ICD-10-CM | POA: Diagnosis not present

## 2021-06-22 DIAGNOSIS — Z79899 Other long term (current) drug therapy: Secondary | ICD-10-CM | POA: Diagnosis not present

## 2021-06-25 DIAGNOSIS — N1831 Chronic kidney disease, stage 3a: Secondary | ICD-10-CM | POA: Diagnosis not present

## 2021-06-25 DIAGNOSIS — R7309 Other abnormal glucose: Secondary | ICD-10-CM | POA: Diagnosis not present

## 2021-06-25 DIAGNOSIS — E785 Hyperlipidemia, unspecified: Secondary | ICD-10-CM | POA: Diagnosis not present

## 2021-06-25 DIAGNOSIS — I10 Essential (primary) hypertension: Secondary | ICD-10-CM | POA: Diagnosis not present

## 2021-06-25 DIAGNOSIS — E1122 Type 2 diabetes mellitus with diabetic chronic kidney disease: Secondary | ICD-10-CM | POA: Diagnosis not present

## 2021-08-26 ENCOUNTER — Other Ambulatory Visit (HOSPITAL_COMMUNITY): Payer: Self-pay | Admitting: Internal Medicine

## 2021-08-26 ENCOUNTER — Other Ambulatory Visit: Payer: Self-pay | Admitting: Internal Medicine

## 2021-08-26 DIAGNOSIS — L03116 Cellulitis of left lower limb: Secondary | ICD-10-CM

## 2021-08-26 DIAGNOSIS — C4372 Malignant melanoma of left lower limb, including hip: Secondary | ICD-10-CM | POA: Diagnosis not present

## 2021-08-26 DIAGNOSIS — I739 Peripheral vascular disease, unspecified: Secondary | ICD-10-CM | POA: Diagnosis not present

## 2021-08-26 DIAGNOSIS — L97401 Non-pressure chronic ulcer of unspecified heel and midfoot limited to breakdown of skin: Secondary | ICD-10-CM | POA: Diagnosis not present

## 2021-08-31 DIAGNOSIS — M79672 Pain in left foot: Secondary | ICD-10-CM | POA: Diagnosis not present

## 2021-08-31 DIAGNOSIS — E1151 Type 2 diabetes mellitus with diabetic peripheral angiopathy without gangrene: Secondary | ICD-10-CM | POA: Diagnosis not present

## 2021-08-31 DIAGNOSIS — E1140 Type 2 diabetes mellitus with diabetic neuropathy, unspecified: Secondary | ICD-10-CM | POA: Diagnosis not present

## 2021-08-31 DIAGNOSIS — L89892 Pressure ulcer of other site, stage 2: Secondary | ICD-10-CM | POA: Diagnosis not present

## 2021-09-03 ENCOUNTER — Ambulatory Visit (HOSPITAL_COMMUNITY)
Admission: RE | Admit: 2021-09-03 | Discharge: 2021-09-03 | Disposition: A | Discharge status: Home / Self Care | Payer: Medicare Other | Source: Ambulatory Visit | Attending: Internal Medicine | Admitting: Internal Medicine

## 2021-09-03 DIAGNOSIS — L03116 Cellulitis of left lower limb: Secondary | ICD-10-CM | POA: Insufficient documentation

## 2021-09-04 DIAGNOSIS — L97429 Non-pressure chronic ulcer of left heel and midfoot with unspecified severity: Secondary | ICD-10-CM | POA: Diagnosis not present

## 2021-09-04 DIAGNOSIS — I739 Peripheral vascular disease, unspecified: Secondary | ICD-10-CM | POA: Diagnosis not present

## 2021-09-07 DIAGNOSIS — L859 Epidermal thickening, unspecified: Secondary | ICD-10-CM | POA: Diagnosis not present

## 2021-09-07 DIAGNOSIS — X32XXXD Exposure to sunlight, subsequent encounter: Secondary | ICD-10-CM | POA: Diagnosis not present

## 2021-09-07 DIAGNOSIS — L570 Actinic keratosis: Secondary | ICD-10-CM | POA: Diagnosis not present

## 2021-09-15 DIAGNOSIS — E1140 Type 2 diabetes mellitus with diabetic neuropathy, unspecified: Secondary | ICD-10-CM | POA: Diagnosis not present

## 2021-09-15 DIAGNOSIS — M79672 Pain in left foot: Secondary | ICD-10-CM | POA: Diagnosis not present

## 2021-09-15 DIAGNOSIS — L89892 Pressure ulcer of other site, stage 2: Secondary | ICD-10-CM | POA: Diagnosis not present

## 2021-09-15 DIAGNOSIS — E1151 Type 2 diabetes mellitus with diabetic peripheral angiopathy without gangrene: Secondary | ICD-10-CM | POA: Diagnosis not present

## 2021-09-28 DIAGNOSIS — L0390 Cellulitis, unspecified: Secondary | ICD-10-CM | POA: Diagnosis not present

## 2021-09-28 DIAGNOSIS — L89620 Pressure ulcer of left heel, unstageable: Secondary | ICD-10-CM | POA: Diagnosis not present

## 2021-09-29 DIAGNOSIS — L89892 Pressure ulcer of other site, stage 2: Secondary | ICD-10-CM | POA: Diagnosis not present

## 2021-09-29 DIAGNOSIS — E1140 Type 2 diabetes mellitus with diabetic neuropathy, unspecified: Secondary | ICD-10-CM | POA: Diagnosis not present

## 2021-09-29 DIAGNOSIS — M79672 Pain in left foot: Secondary | ICD-10-CM | POA: Diagnosis not present

## 2021-09-29 DIAGNOSIS — E1151 Type 2 diabetes mellitus with diabetic peripheral angiopathy without gangrene: Secondary | ICD-10-CM | POA: Diagnosis not present

## 2021-10-12 DIAGNOSIS — N1831 Chronic kidney disease, stage 3a: Secondary | ICD-10-CM | POA: Diagnosis not present

## 2021-10-12 DIAGNOSIS — E785 Hyperlipidemia, unspecified: Secondary | ICD-10-CM | POA: Diagnosis not present

## 2021-10-12 DIAGNOSIS — I10 Essential (primary) hypertension: Secondary | ICD-10-CM | POA: Diagnosis not present

## 2021-10-12 DIAGNOSIS — E1129 Type 2 diabetes mellitus with other diabetic kidney complication: Secondary | ICD-10-CM | POA: Diagnosis not present

## 2021-10-15 DIAGNOSIS — E1140 Type 2 diabetes mellitus with diabetic neuropathy, unspecified: Secondary | ICD-10-CM | POA: Diagnosis not present

## 2021-10-15 DIAGNOSIS — L89892 Pressure ulcer of other site, stage 2: Secondary | ICD-10-CM | POA: Diagnosis not present

## 2021-10-15 DIAGNOSIS — M79672 Pain in left foot: Secondary | ICD-10-CM | POA: Diagnosis not present

## 2021-10-15 DIAGNOSIS — E1151 Type 2 diabetes mellitus with diabetic peripheral angiopathy without gangrene: Secondary | ICD-10-CM | POA: Diagnosis not present

## 2021-10-15 DIAGNOSIS — M79675 Pain in left toe(s): Secondary | ICD-10-CM | POA: Diagnosis not present

## 2021-10-16 DIAGNOSIS — N183 Chronic kidney disease, stage 3 unspecified: Secondary | ICD-10-CM | POA: Diagnosis not present

## 2021-10-16 DIAGNOSIS — R809 Proteinuria, unspecified: Secondary | ICD-10-CM | POA: Diagnosis not present

## 2021-10-16 DIAGNOSIS — E1129 Type 2 diabetes mellitus with other diabetic kidney complication: Secondary | ICD-10-CM | POA: Diagnosis not present

## 2021-10-16 DIAGNOSIS — R739 Hyperglycemia, unspecified: Secondary | ICD-10-CM | POA: Diagnosis not present

## 2021-10-26 DIAGNOSIS — X32XXXD Exposure to sunlight, subsequent encounter: Secondary | ICD-10-CM | POA: Diagnosis not present

## 2021-10-26 DIAGNOSIS — L820 Inflamed seborrheic keratosis: Secondary | ICD-10-CM | POA: Diagnosis not present

## 2021-10-26 DIAGNOSIS — L570 Actinic keratosis: Secondary | ICD-10-CM | POA: Diagnosis not present

## 2021-10-26 DIAGNOSIS — C4441 Basal cell carcinoma of skin of scalp and neck: Secondary | ICD-10-CM | POA: Diagnosis not present

## 2021-11-05 DIAGNOSIS — M79672 Pain in left foot: Secondary | ICD-10-CM | POA: Diagnosis not present

## 2021-11-05 DIAGNOSIS — E1151 Type 2 diabetes mellitus with diabetic peripheral angiopathy without gangrene: Secondary | ICD-10-CM | POA: Diagnosis not present

## 2021-11-05 DIAGNOSIS — M79675 Pain in left toe(s): Secondary | ICD-10-CM | POA: Diagnosis not present

## 2021-11-05 DIAGNOSIS — E1140 Type 2 diabetes mellitus with diabetic neuropathy, unspecified: Secondary | ICD-10-CM | POA: Diagnosis not present

## 2021-11-05 DIAGNOSIS — L89892 Pressure ulcer of other site, stage 2: Secondary | ICD-10-CM | POA: Diagnosis not present

## 2021-11-26 DIAGNOSIS — M79672 Pain in left foot: Secondary | ICD-10-CM | POA: Diagnosis not present

## 2021-11-26 DIAGNOSIS — E1151 Type 2 diabetes mellitus with diabetic peripheral angiopathy without gangrene: Secondary | ICD-10-CM | POA: Diagnosis not present

## 2021-11-26 DIAGNOSIS — E1140 Type 2 diabetes mellitus with diabetic neuropathy, unspecified: Secondary | ICD-10-CM | POA: Diagnosis not present

## 2021-11-26 DIAGNOSIS — M79675 Pain in left toe(s): Secondary | ICD-10-CM | POA: Diagnosis not present

## 2021-11-26 DIAGNOSIS — L89892 Pressure ulcer of other site, stage 2: Secondary | ICD-10-CM | POA: Diagnosis not present

## 2021-12-31 DIAGNOSIS — L110 Acquired keratosis follicularis: Secondary | ICD-10-CM | POA: Diagnosis not present

## 2021-12-31 DIAGNOSIS — M79675 Pain in left toe(s): Secondary | ICD-10-CM | POA: Diagnosis not present

## 2021-12-31 DIAGNOSIS — I739 Peripheral vascular disease, unspecified: Secondary | ICD-10-CM | POA: Diagnosis not present

## 2021-12-31 DIAGNOSIS — M79674 Pain in right toe(s): Secondary | ICD-10-CM | POA: Diagnosis not present

## 2021-12-31 DIAGNOSIS — M79672 Pain in left foot: Secondary | ICD-10-CM | POA: Diagnosis not present

## 2021-12-31 DIAGNOSIS — M79671 Pain in right foot: Secondary | ICD-10-CM | POA: Diagnosis not present

## 2021-12-31 DIAGNOSIS — E1140 Type 2 diabetes mellitus with diabetic neuropathy, unspecified: Secondary | ICD-10-CM | POA: Diagnosis not present

## 2022-01-21 DIAGNOSIS — M79675 Pain in left toe(s): Secondary | ICD-10-CM | POA: Diagnosis not present

## 2022-01-21 DIAGNOSIS — E1140 Type 2 diabetes mellitus with diabetic neuropathy, unspecified: Secondary | ICD-10-CM | POA: Diagnosis not present

## 2022-01-21 DIAGNOSIS — L110 Acquired keratosis follicularis: Secondary | ICD-10-CM | POA: Diagnosis not present

## 2022-01-21 DIAGNOSIS — L89892 Pressure ulcer of other site, stage 2: Secondary | ICD-10-CM | POA: Diagnosis not present

## 2022-01-21 DIAGNOSIS — M79672 Pain in left foot: Secondary | ICD-10-CM | POA: Diagnosis not present

## 2022-02-04 DIAGNOSIS — Z79899 Other long term (current) drug therapy: Secondary | ICD-10-CM | POA: Diagnosis not present

## 2022-02-04 DIAGNOSIS — I10 Essential (primary) hypertension: Secondary | ICD-10-CM | POA: Diagnosis not present

## 2022-02-04 DIAGNOSIS — E1129 Type 2 diabetes mellitus with other diabetic kidney complication: Secondary | ICD-10-CM | POA: Diagnosis not present

## 2022-02-04 DIAGNOSIS — N1831 Chronic kidney disease, stage 3a: Secondary | ICD-10-CM | POA: Diagnosis not present

## 2022-02-11 DIAGNOSIS — R7309 Other abnormal glucose: Secondary | ICD-10-CM | POA: Diagnosis not present

## 2022-02-11 DIAGNOSIS — N1831 Chronic kidney disease, stage 3a: Secondary | ICD-10-CM | POA: Diagnosis not present

## 2022-02-11 DIAGNOSIS — E1122 Type 2 diabetes mellitus with diabetic chronic kidney disease: Secondary | ICD-10-CM | POA: Diagnosis not present

## 2022-04-01 DIAGNOSIS — L110 Acquired keratosis follicularis: Secondary | ICD-10-CM | POA: Diagnosis not present

## 2022-04-01 DIAGNOSIS — E1140 Type 2 diabetes mellitus with diabetic neuropathy, unspecified: Secondary | ICD-10-CM | POA: Diagnosis not present

## 2022-04-01 DIAGNOSIS — M79675 Pain in left toe(s): Secondary | ICD-10-CM | POA: Diagnosis not present

## 2022-04-01 DIAGNOSIS — M79671 Pain in right foot: Secondary | ICD-10-CM | POA: Diagnosis not present

## 2022-04-01 DIAGNOSIS — I739 Peripheral vascular disease, unspecified: Secondary | ICD-10-CM | POA: Diagnosis not present

## 2022-04-01 DIAGNOSIS — M79672 Pain in left foot: Secondary | ICD-10-CM | POA: Diagnosis not present

## 2022-04-01 DIAGNOSIS — M79674 Pain in right toe(s): Secondary | ICD-10-CM | POA: Diagnosis not present

## 2022-04-02 DIAGNOSIS — L0390 Cellulitis, unspecified: Secondary | ICD-10-CM | POA: Diagnosis not present

## 2022-04-02 DIAGNOSIS — Z23 Encounter for immunization: Secondary | ICD-10-CM | POA: Diagnosis not present

## 2022-04-29 ENCOUNTER — Encounter (HOSPITAL_BASED_OUTPATIENT_CLINIC_OR_DEPARTMENT_OTHER): Payer: Medicare Other | Attending: General Surgery | Admitting: General Surgery

## 2022-04-29 DIAGNOSIS — L89322 Pressure ulcer of left buttock, stage 2: Secondary | ICD-10-CM | POA: Insufficient documentation

## 2022-04-29 DIAGNOSIS — N189 Chronic kidney disease, unspecified: Secondary | ICD-10-CM | POA: Diagnosis not present

## 2022-04-29 DIAGNOSIS — I129 Hypertensive chronic kidney disease with stage 1 through stage 4 chronic kidney disease, or unspecified chronic kidney disease: Secondary | ICD-10-CM | POA: Diagnosis not present

## 2022-04-29 DIAGNOSIS — L97812 Non-pressure chronic 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: Secondary | ICD-10-CM | POA: Diagnosis not present

## 2022-04-29 DIAGNOSIS — E11622 Type 2 diabetes mellitus with other skin ulcer: Secondary | ICD-10-CM | POA: Diagnosis not present

## 2022-04-29 DIAGNOSIS — I890 Lymphedema, not elsewhere classified: Secondary | ICD-10-CM | POA: Insufficient documentation

## 2022-04-29 DIAGNOSIS — E1151 Type 2 diabetes mellitus with diabetic peripheral angiopathy without gangrene: Secondary | ICD-10-CM | POA: Insufficient documentation

## 2022-04-29 DIAGNOSIS — L97212 Non-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: Secondary | ICD-10-CM | POA: Diagnosis not present

## 2022-04-29 DIAGNOSIS — E1140 Type 2 diabetes mellitus with diabetic neuropathy, unspecified: Secondary | ICD-10-CM | POA: Diagnosis not present

## 2022-04-29 DIAGNOSIS — E1122 Type 2 diabetes mellitus with diabetic chronic kidney disease: Secondary | ICD-10-CM | POA: Diagnosis not present

## 2022-04-30 NOTE — Progress Notes (Signed)
[name], [name] ([id number]) [id number] Page 1 of 4 Visit Report for 04/29/2022 Abuse Risk Screen Details Patient Name: Date of Service: [name], [name]. 04/29/2022 1:15 PM Medical Record Number: [id number] Patient Account Number: [id number] Date of Birth/Sex: Treating RN: 1932-12-21 (89 y.o. [name] Primary Care Gabryel Talamo: [name] Other Clinician: Referring Etsuko Dierolf: Treating Taylor Levick/Extender: [name] in Treatment: 0 Abuse Risk Screen Items Answer ABUSE RISK SCREEN: Has anyone close to you tried to hurt or harm you recentlyo No Do you feel uncomfortable with anyone in your familyo No Has anyone forced you do things that you didnt want to doo No Electronic Signature(s) Signed: 04/29/2022 3:59:24 PM By: Adline Peals Entered By: Adline Peals on 04/29/2022 13:36:30 -------------------------------------------------------------------------------- Activities of Daily Living Details Patient Name: Date of Service: [place] [name], [name]. 04/29/2022 1:15 PM Medical Record Number: [id number] Patient Account Number: [id number] Date of Birth/Sex: Treating RN: July 03, 1932 (89 y.o. [name] Primary Care [name]: [name] Other Clinician: Referring [name]: Treating [name]/Extender: Toy Baker Weeks in Treatment: 0 Activities of Daily Living Items Answer Activities of Daily Living (Please select one for each item) Drive Automobile Not Able T Medications ake Need Assistance Use T elephone Need Assistance Care for Appearance Need Assistance Use T oilet Completely Able Bath / Shower Need Assistance Dress Self Completely Able Feed Self Completely Able Walk Completely Able Get In / Out Bed Completely Able Housework Not Able Prepare Meals Not Able Handle Money Need Assistance Shop for Self Not Able Electronic Signature(s) Signed: 04/29/2022 3:59:24 PM By:  Adline Peals Entered By: Adline Peals on 04/29/2022 13:37:15 Goldsmith, Beyonka [name] ([id number]) [id number] Page 2 of 4 -------------------------------------------------------------------------------- Education Screening Details Patient Name: Date of Service: [name], [name] [name] 04/29/2022 1:15 PM Medical Record Number: [id number] Patient Account Number: [id number] Date of Birth/Sex: Treating RN: Apr 16, 1933 (89 y.o. [name] Primary Care [name]: [name] Other Clinician: Referring [name]: Treating Ethon [name]/Extender: [name] in Treatment: 0 Primary Learner Assessed: Patient Learning Preferences/Education Level/Primary Language Learning Preference: Explanation, Demonstration, Video, Printed Material Highest Education Level: High School Preferred Language: English Cognitive Barrier Language Barrier: No Translator Needed: No Memory Deficit: No Emotional Barrier: No Cultural/Religious Beliefs Affecting Medical Care: No Physical Barrier Impaired Vision: Yes Impaired Hearing: Yes Decreased Hand dexterity: No Knowledge/Comprehension Knowledge Level: Medium Comprehension Level: Medium Ability to understand written instructions: Medium Ability to understand verbal instructions: Medium Motivation Anxiety Level: Calm Cooperation: Cooperative Education Importance: Acknowledges Need Interest in Health Problems: Asks Questions Perception: Coherent Willingness to Engage in Self-Management Medium Activities: Readiness to Engage in Self-Management Medium Activities: Electronic Signature(s) Signed: 04/29/2022 3:59:24 PM By: Adline Peals Entered By: Adline Peals on 04/29/2022 13:38:11 -------------------------------------------------------------------------------- Fall Risk Assessment Details Patient Name: Date of Service: [name], [place] TTIE [name]. 04/29/2022 1:15 PM Medical Record Number:  [id number] Patient Account Number: [id number] Date of Birth/Sex: Treating RN: [birth year] (89 y.o. [name] Primary Care [name]: [name] Other Clinician: Referring [name]: Treating [name]/Extender: [name] in Treatment: 0 Fall Risk Assessment Items Have you had 2 or more falls in the last [address] [name] ([id number]) [id number] Page 3 of 4 Have you had any fall that resulted in injury in the last 12 monthso 0 No FALLS RISK SCREEN History of falling - immediate or within 3 months 0 No Secondary diagnosis (Do you have 2 or more  medical diagnoseso) 15 Yes Ambulatory aid None/bed rest/wheelchair/nurse 0 Yes Crutches/cane/walker 0 No Furniture 0 No Intravenous therapy Access/Saline/Heparin Lock 0 No Gait/Transferring Normal/ bed rest/ wheelchair 0 Yes Weak (short steps with or without shuffle, stooped but able to lift head while walking, may seek 10 Yes support from furniture) Impaired (short steps with shuffle, may have difficulty arising from chair, head down, impaired 20 Yes balance) Mental Status Oriented to own ability 0 Yes Electronic Signature(s) Signed: 04/29/2022 3:59:24 PM By: Adline Peals Entered By: Adline Peals on 04/29/2022 13:38:34 -------------------------------------------------------------------------------- Foot Assessment Details Patient Name: Date of Service: [name] TTIE [name]. 04/29/2022 1:15 PM Medical Record Number: [id number] Patient Account Number: [id number] Date of Birth/Sex: Treating RN: December 09, 1932 (89 y.o. [name] Primary Care [name]: [name] Other Clinician: Referring [name]: Treating [name]/Extender: [name] in Treatment: 0 Foot Assessment Items Site Locations + = Sensation present, - = Sensation absent, C = Callus, U = Ulcer R = Redness, W = Warmth, M = Maceration, PU =  Pre-ulcerative lesion F = Fissure, S = Swelling, D = Dryness Assessment Right: Left: Other Deformity: No No Prior Foot Ulcer: No No Prior Amputation: No No Charcot Joint: No No Ambulatory Status: Ambulatory Without Help GaitMYRIKAL, MESSMER ([id number]) [phone].pdf Page 4 of 4 Electronic Signature(s) Signed: 04/29/2022 3:59:24 PM By: Adline Peals Entered By: Adline Peals on 04/29/2022 13:44:28 -------------------------------------------------------------------------------- Nutrition Risk Screening Details Patient Name: Date of Service: [name], [place] TTIE [name]. 04/29/2022 1:15 PM Medical Record Number: [id number] Patient Account Number: [id number] Date of Birth/Sex: Treating RN: 02/03/1933 (89 y.o. [name] Primary Care Noeh Sparacino: [name] Other Clinician: Referring Traveion Ruddock: Treating Guthrie Lemme/Extender: Toy Baker Weeks in Treatment: 0 Height (in): Weight (lbs): Body Mass Index (BMI): Nutrition Risk Screening Items Score Screening NUTRITION RISK SCREEN: I have an illness or condition that made me change the kind and/or amount of food I eat 0 No I eat fewer than two meals per day 0 No I eat few fruits and vegetables, or milk products 0 No I have three or more drinks of beer, liquor or wine almost every day 0 No I have tooth or mouth problems that make it hard for me to eat 0 No I don't always have enough money to buy the food I need 0 No I eat alone most of the time 1 Yes I take three or more different prescribed or over-the-counter drugs a day 1 Yes Without wanting to, I have lost or gained 10 pounds in the last six months 0 No I am not always physically able to shop, cook and/or feed myself 2 Yes Nutrition Protocols Good Risk Protocol Moderate Risk Protocol 0 Provide education on nutrition High Risk Proctocol Risk Level: Moderate Risk Score: 4 Electronic Signature(s) Signed: 04/29/2022  3:59:24 PM By: Adline Peals Entered By: Adline Peals on 04/29/2022 13:39:26

## 2022-04-30 NOTE — Progress Notes (Signed)
[name] ([id number]) [id number] Page 1 of 13 Visit Report for 04/29/2022 Chief Complaint Document Details Patient Name: Date of Service: [place] [name], [name] 04/29/2022 1:15 PM Medical Record Number: [id number] Patient Account Number: [id number] Date of Birth/Sex: Treating RN: Dec 10, 1932 (89 y.o. F) Primary Care Provider: Dellis [name] Other Clinician: Referring Provider: Treating Provider/Extender: [name] in Treatment: 0 Information Obtained from: Patient Chief Complaint Patient presents for treatment of multiple open ulcers due to suspected venous insufficiency and a gluteal pressure ulcer Electronic Signature(s) Signed: 04/29/2022 2:43:52 PM By: Kimberly Maudlin MD FACS Entered By: [name] on 04/29/2022 14:43:51 -------------------------------------------------------------------------------- Debridement Details Patient Name: Date of Service: [name], [name]. 04/29/2022 1:15 PM Medical Record Number: [id number] Patient Account Number: [id number] Date of Birth/Sex: Treating RN: 1933-04-14 (89 y.o. [name] Primary Care Provider: Dellis [name] Other Clinician: Referring Provider: Treating Provider/Extender: [name] in Treatment: 0 Debridement Performed for Assessment: Wound #3 Right,Anterior Lower Leg Performed By: Physician Kimberly Maudlin, MD Debridement Type: Debridement Level of Consciousness (Pre-procedure): Awake and Alert Pre-procedure Verification/Time Out Yes - 14:12 Taken: Start Time: 14:12 Pain Control: Lidocaine 4% T opical Solution T Area Debrided (L x W): otal 0.3 (cm) x 0.5 (cm) = 0.15 (cm) Tissue and other material debrided: Non-Viable, Eschar, Slough, Slough Level: Non-Viable Tissue Debridement Description: Selective/Open Wound Instrument: Curette Bleeding: Minimum Hemostasis Achieved: Pressure Response to Treatment: Procedure was tolerated  well Level of Consciousness (Post- Awake and Alert procedure): Post Debridement Measurements of Total Wound Length: (cm) 0.3 Width: (cm) 0.5 Depth: (cm) 0.1 Volume: (cm) 0.012 Character of Wound/Ulcer Post Debridement: Improved Post Procedure Diagnosis Same as [name], [name] ([id number]) [phone].pdf Page 2 of 13 Notes scribed for Dr. Celine [name] by Kimberly Peals, RN Electronic Signature(s) Signed: 04/29/2022 2:57:10 PM By: Kimberly Maudlin MD FACS Signed: 04/29/2022 3:59:24 PM By: [name] Entered By: [name] on 04/29/2022 14:13:19 -------------------------------------------------------------------------------- Debridement Details Patient Name: Date of Service: [name], [name]. 04/29/2022 1:15 PM Medical Record Number: [id number] Patient Account Number: [id number] Date of Birth/Sex: Treating RN: [birth year] (89 y.o. [name] Primary Care Provider: Dellis [name] Other Clinician: Referring Provider: Treating Provider/Extender: [name] in Treatment: 0 Debridement Performed for Assessment: Wound #1 Right,Posterior Lower Leg Performed By: Physician Kimberly Maudlin, MD Debridement Type: Debridement Level of Consciousness (Pre-procedure): Awake and Alert Pre-procedure Verification/Time Out Yes - 14:12 Taken: Start Time: 14:12 Pain Control: Lidocaine 4% T opical Solution T Area Debrided (L x W): otal 5 (cm) x 4 (cm) = 20 (cm) Tissue and other material debrided: Non-Viable, Slough, Subcutaneous, Slough Level: Skin/Subcutaneous Tissue Debridement Description: Excisional Instrument: Curette Bleeding: Minimum Hemostasis Achieved: Pressure Response to Treatment: Procedure was tolerated well Level of Consciousness (Post- Awake and Alert procedure): Post Debridement Measurements of Total Wound Length: (cm) 8 Width: (cm) 7 Depth: (cm) 0.1 Volume: (cm) 4.398 Character of  Wound/Ulcer Post Debridement: Improved Post Procedure Diagnosis Same as Pre-procedure Notes scribed for Dr. Celine [name] by Kimberly Peals, RN Electronic Signature(s) Signed: 04/29/2022 2:57:10 PM By: Kimberly Maudlin MD FACS Signed: 04/29/2022 3:59:24 PM By: [name] Entered By: [name] on 04/29/2022 14:15:15 -------------------------------------------------------------------------------- Debridement Details Patient Name: Date of Service: [name], [name]. 04/29/2022 1:15 PM Medical Record Number: [id number] Patient Account Number: [id number] Date of Birth/Sex: Treating RN: 03/01/1933 (89 y.o. [name] Primary Care Provider: Dellis [name] Other Clinician: [name], [name] ([id number]) [id number] Page 3  of 13 Referring Provider: Treating Provider/Extender: [name] in Treatment: 0 Debridement Performed for Assessment: Wound #2 Left Gluteal fold Performed By: Physician Kimberly Maudlin, MD Debridement Type: Debridement Level of Consciousness (Pre-procedure): Awake and Alert Pre-procedure Verification/Time Out Yes - 14:12 Taken: Start Time: 14:12 Pain Control: Lidocaine 4% T opical Solution T Area Debrided (L x W): otal 1 (cm) x 0.8 (cm) = 0.8 (cm) Tissue and other material debrided: Non-Viable, Slough, Subcutaneous, Slough Level: Skin/Subcutaneous Tissue Debridement Description: Excisional Instrument: Curette Bleeding: Minimum Hemostasis Achieved: Pressure Response to Treatment: Procedure was tolerated well Level of Consciousness (Post- Awake and Alert procedure): Post Debridement Measurements of Total Wound Length: (cm) 1 Width: (cm) 0.8 Depth: (cm) 0.1 Volume: (cm) 0.063 Character of Wound/Ulcer Post Debridement: Improved Post Procedure Diagnosis Same as Pre-procedure Notes scribed for Dr. Celine [name] by Kimberly Peals, RN Electronic Signature(s) Signed: 04/29/2022 2:57:10 PM By:  Kimberly Maudlin MD FACS Signed: 04/29/2022 3:59:24 PM By: [name] Entered By: [name] on 04/29/2022 14:21:56 -------------------------------------------------------------------------------- HPI Details Patient Name: Date of Service: [name], [name]. 04/29/2022 1:15 PM Medical Record Number: [id number] Patient Account Number: [id number] Date of Birth/Sex: Treating RN: 08/09/1932 (89 y.o. F) Primary Care Provider: Dellis [name] Other Clinician: Referring Provider: Treating Provider/Extender: [name] in Treatment: 0 History of Present Illness HPI Description: ADMISSION 04/29/2022 This is an 89 year old type II diabetic (no hemoglobin A1c available for review) who also has hypertension, neuropathy, peripheral angiopathy, chronic kidney disease, among other medical comorbidities. Her history is somewhat difficult to put together as she is extremely hard of hearing and according to her son, who accompanies her today, she does not reliably see physicians. There are apparently some concerns for self-neglect, but the family dynamics are complicated. She has 2+ pitting edema bilaterally with bilateral erythema suggestive of stasis dermatitis. What little outside information was sent ahead indicates that she was thought to potentially have cellulitis and is she is currently taking Keflex for this. She also has open wounds on her right lower extremity on both the anterior, posterior, and medial aspects. They vary in depth but all have slough and nonviable subcutaneous tissue present. She has a small pressure ulcer on her left gluteus, just adjacent to the natal cleft. Electronic Signature(s) Signed: 04/29/2022 2:49:30 PM By: Kimberly Maudlin MD FACS Entered By: [name] on 04/29/2022 14:49:30 Jeneen Rinks, Dajanay [name] ([id number]) [phone].pdf Page 4 of  13 -------------------------------------------------------------------------------- Physical Exam Details Patient Name: Date of Service: [name], [name]. 04/29/2022 1:15 PM Medical Record Number: [id number] Patient Account Number: [id number] Date of Birth/Sex: Treating RN: Sep 10, 1932 (89 y.o. F) Primary Care Provider: Dellis [name] Other Clinician: Referring Provider: Treating Provider/Extender: Toy Baker Weeks in Treatment: 0 Constitutional Hypertensive, asymptomatic. . . . No acute distress. Respiratory Normal work of breathing on room air. Cardiovascular 2+ pitting edema bilaterally. Changes consistent with stasis dermatitis.. Notes 04/29/2022: She has 2+ pitting edema bilaterally with bilateral erythema suggestive of stasis dermatitis. She also has open wounds on her right lower extremity on both the anterior, posterior, and medial aspects. They vary in depth but all have slough and nonviable subcutaneous tissue present. She has a small pressure ulcer on her left gluteus, just adjacent to the natal cleft. There is slough and eschar accumulation here, as well. Electronic Signature(s) Signed: 04/29/2022 2:50:50 PM By: Kimberly Maudlin MD FACS Entered By: [name] on 04/29/2022 14:50:49 -------------------------------------------------------------------------------- Physician Orders Details Patient Name: Date of Service: [name]  [name], [name]. 04/29/2022 1:15 PM Medical Record Number: [id number] Patient Account Number: [id number] Date of Birth/Sex: Treating RN: 07-23-1932 (89 y.o. [name] Primary Care Provider: Dellis [name] Other Clinician: Referring Provider: Treating Provider/Extender: [name] in Treatment: 0 Verbal / Phone Orders: No Diagnosis Coding ICD-10 Coding Code Description [phone] Non-pressure chronic 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 L97.212 Non-pressure chronic ulcer of  right calf with fat layer exposed L89.322 Pressure ulcer of left buttock, stage 2 E11.622 Type 2 diabetes mellitus with other skin ulcer E11.40 Type 2 diabetes mellitus with diabetic neuropathy, unspecified I73.9 Peripheral vascular disease, unspecified H91.93 Unspecified hearing loss, bilateral R60.0 Localized edema Follow-up Appointments Return Appointment in 1 week. Anesthetic (In clinic) Topical Lidocaine 4% applied to wound bed Bathing/ Shower/ Hygiene May shower with protection but do not get wound dressing(s) wet. Protect dressing(s) with water repellant cover (for example, large plastic ANNELIE, BOAK ([id number]) [id number] Page 5 of 13 bag) or a cast cover and may then take shower. - legs May shower and wash wound with soap and water. - bottom Edema Control - Lymphedema / SCD / Other Elevate legs to the level of the heart or above for 30 minutes daily and/or when sitting for 3-4 times a day throughout the day. A void standing for long periods of time. Moisturize legs daily. If compression wraps slide down please call wound center and speak with a nurse. Non Wound Condition Left Lower Extremity Other Non Wound Condition Orders/Instructions: - 3 layer wrap Home Health Admit to Home Health for skilled nursing wound care. May utilize formulary equivalent dressing for wound treatment orders unless otherwise specified. Dressing changes to be completed by [place] on Monday / Wednesday / Friday except when patient has scheduled visit at [facility]. Wound Treatment Wound #1 - Lower Leg Wound Laterality: Right, Posterior Cleanser: Soap and Water 1 x Per Week/30 Days Discharge Instructions: May shower and wash wound with dial antibacterial soap and water prior to dressing change. Cleanser: Wound Cleanser 1 x Per Week/30 Days Discharge Instructions: Cleanse the wound with wound cleanser prior to applying a clean dressing using gauze sponges, not  tissue or cotton balls. Peri-Wound Care: Sween Lotion (Moisturizing lotion) 1 x Per Week/30 Days Discharge Instructions: Apply moisturizing lotion as directed Prim Dressing: Sorbalgon AG Dressing, 4x4 (in/in) 1 x Per Week/30 Days ary Discharge Instructions: Apply to wound bed as instructed Secondary Dressing: ABD Pad, 5x9 1 x Per Week/30 Days Discharge Instructions: Apply over primary dressing as directed. Secondary Dressing: Woven Gauze Sponge, Non-Sterile 4x4 in 1 x Per Week/30 Days Discharge Instructions: Apply over primary dressing as directed. Compression Wrap: ThreePress (3 layer compression wrap) 1 x Per Week/30 Days Discharge Instructions: Apply three layer compression as directed. Wound #2 - Gluteal fold Wound Laterality: Left Cleanser: Soap and Water 1 x Per Day/30 Days Discharge Instructions: May shower and wash wound with dial antibacterial soap and water prior to dressing change. Cleanser: Wound Cleanser 1 x Per Day/30 Days Discharge Instructions: Cleanse the wound with wound cleanser prior to applying a clean dressing using gauze sponges, not tissue or cotton balls. Prim Dressing: Sorbalgon AG Dressing 2x2 (in/in) 1 x Per Day/30 Days ary Discharge Instructions: Apply to wound bed as instructed Secondary Dressing: Zetuvit Plus Silicone Border Dressing 4x4 (in/in) 1 x Per Day/30 Days Discharge Instructions: Apply silicone border over primary dressing as directed. Wound #3 - Lower Leg Wound Laterality: Right,  Anterior Cleanser: Soap and Water 1 x Per Week/30 Days Discharge Instructions: May shower and wash wound with dial antibacterial soap and water prior to dressing change. Cleanser: Wound Cleanser 1 x Per Week/30 Days Discharge Instructions: Cleanse the wound with wound cleanser prior to applying a clean dressing using gauze sponges, not tissue or cotton balls. Peri-Wound Care: Sween Lotion (Moisturizing lotion) 1 x Per Week/30 Days Discharge Instructions: Apply moisturizing  lotion as directed Prim Dressing: Sorbalgon AG Dressing, 4x4 (in/in) 1 x Per Week/30 Days ary Discharge Instructions: Apply to wound bed as instructed Secondary Dressing: ABD Pad, 5x9 1 x Per Week/30 Days Discharge Instructions: Apply over primary dressing as directed. Secondary Dressing: Woven Gauze Sponge, Non-Sterile 4x4 in 1 x Per Week/30 Days Discharge Instructions: Apply over primary dressing as directed. Compression Wrap: ThreePress (3 layer compression wrap) 1 x Per Week/30 Days Discharge Instructions: Apply three layer compression as directed. [name], [name] ([id number]) [id number] Page 6 of 13 Patient Medications llergies: No Known Allergies A Notifications Medication Indication Start End 04/29/2022 lidocaine DOSE topical 4 % cream - cream topical Electronic Signature(s) Signed: 04/29/2022 3:25:43 PM By: Kimberly Maudlin MD FACS Signed: 04/29/2022 3:59:24 PM By: [name] Previous Signature: 04/29/2022 2:57:10 PM Version By: Kimberly Maudlin MD FACS Entered By: [name] on 04/29/2022 15:19:09 -------------------------------------------------------------------------------- Problem List Details Patient Name: Date of Service: [name], [name]. 04/29/2022 1:15 PM Medical Record Number: [id number] Patient Account Number: [id number] Date of Birth/Sex: Treating RN: 04-22-1932 (89 y.o. F) Primary Care Provider: Dellis [name] Other Clinician: Referring Provider: Treating Provider/Extender: Toy Baker Weeks in Treatment: 0 Active Problems ICD-10 Encounter Code Description Active Date MDM Diagnosis [phone] Non-pressure chronic ulcer of other part of right lower leg with fat layer 04/29/2022 No Yes exposed L97.212 Non-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 04/29/2022 No Yes L89.322 Pressure ulcer of left buttock, stage 2 04/29/2022 No Yes E11.622 Type 2 diabetes mellitus with other skin ulcer  04/29/2022 No Yes E11.40 Type 2 diabetes mellitus with diabetic neuropathy, unspecified 04/29/2022 No Yes I73.9 Peripheral vascular disease, unspecified 04/29/2022 No Yes H91.93 Unspecified hearing loss, bilateral 04/29/2022 No Yes R60.0 Localized edema 04/29/2022 No Yes Inactive Problems Resolved Problems MARCIEL, OFFENBERGER ([id number]) [phone].pdf Page 7 of 13 Electronic Signature(s) Signed: 04/29/2022 2:41:53 PM By: Kimberly Maudlin MD FACS Previous Signature: 04/29/2022 2:01:01 PM Version By: Kimberly Maudlin MD FACS Entered By: [name] on 04/29/2022 14:41:53 -------------------------------------------------------------------------------- Progress Note Details Patient Name: Date of Service: [name], [name]. 04/29/2022 1:15 PM Medical Record Number: [id number] Patient Account Number: [id number] Date of Birth/Sex: Treating RN: 1932/05/14 (89 y.o. F) Primary Care Provider: Dellis [name] Other Clinician: Referring Provider: Treating Provider/Extender: [name] in Treatment: 0 Subjective Chief Complaint Information obtained from Patient Patient presents for treatment of multiple open ulcers due to suspected venous insufficiency and a gluteal pressure ulcer History of Present Illness (HPI) ADMISSION 04/29/2022 This is an 89 year old type II diabetic (no hemoglobin A1c available for review) who also has hypertension, neuropathy, peripheral angiopathy, chronic kidney disease, among other medical comorbidities. Her history is somewhat difficult to put together as she is extremely hard of hearing and according to her son, who accompanies her today, she does not reliably see physicians. There are apparently some concerns for self-neglect, but the family dynamics are complicated. She has 2+ pitting edema bilaterally with bilateral erythema suggestive of stasis dermatitis. What little outside information was sent ahead indicates that she  was thought to potentially have cellulitis and is she is currently taking Keflex for this. She also has open wounds on her right lower extremity on both the anterior, posterior, and medial aspects. They vary in depth but all have slough and nonviable subcutaneous tissue present. She has a small pressure ulcer on her left gluteus, just adjacent to the natal cleft. Patient History Information obtained from Patient. Allergies No Known Allergies Social History Never smoker, Marital Status - Widowed, Alcohol Use - Never, Drug Use - No History, Caffeine Use - Never. Medical History Eyes Patient has history of Cataracts Cardiovascular Patient has history of Hypertension Endocrine Patient has history of Type II Diabetes Musculoskeletal Patient has history of Osteoarthritis Patient is treated with Insulin. Blood sugar is tested. Hospitalization/Surgery History - cataract extraction. - rectocele repair. - abdominal hysterectomy. - breast surgery. - c-eye surgery procedure. - eye surgery. Medical A Surgical History Notes nd Eyes diabetic retinopathy, macular degeneration Review of Systems (ROS) Constitutional Symptoms (General Health) Denies complaints or symptoms of Fatigue, Fever, Chills, Marked Weight Change. Ear/Nose/Mouth/Throat Denies complaints or symptoms of Chronic sinus problems or rhinitis. Respiratory Denies complaints or symptoms of Chronic or frequent coughs, Shortness of Breath. Gastrointestinal Denies complaints or symptoms of Frequent diarrhea, Nausea, Vomiting. Genitourinary Denies complaints or symptoms of Frequent urination. Integumentary (Skin) Complains or has symptoms of Wounds. Musculoskeletal Denies complaints or symptoms of Muscle Pain, Muscle Weakness. Neurologic Denies complaints or symptoms of Numbness/parasthesias. [name], [name] ([id number]) [id number] Page 8 of 13 Psychiatric Denies complaints or symptoms of  Claustrophobia. Objective Constitutional Hypertensive, asymptomatic. No acute distress. Vitals Time Taken: 1:30 PM, Temperature: 98.2 F, Pulse: 88 bpm, Respiratory Rate: 20 breaths/min, Blood Pressure: 183/70 mmHg. Respiratory Normal work of breathing on room air. Cardiovascular 2+ pitting edema bilaterally. Changes consistent with stasis dermatitis.. General Notes: 04/29/2022: She has 2+ pitting edema bilaterally with bilateral erythema suggestive of stasis dermatitis. She also has open wounds on her right lower extremity on both the anterior, posterior, and medial aspects. They vary in depth but all have slough and nonviable subcutaneous tissue present. She has a small pressure ulcer on her left gluteus, just adjacent to the natal cleft. There is slough and eschar accumulation here, as well. Integumentary (Hair, Skin) Wound #1 status is Open. Original cause of wound was Blister. The date acquired was: 01/05/2022. The wound is located on the Right,Posterior Lower Leg. The wound measures 8cm length x 7cm width x 0.1cm depth; 43.982cm^2 area and 4.398cm^3 volume. There is Fat Layer (Subcutaneous Tissue) exposed. There is no tunneling or undermining noted. There is a medium amount of serosanguineous drainage noted. The wound margin is distinct with the outline attached to the wound base. There is small (1-33%) red, pink granulation within the wound bed. There is a large (67-100%) amount of necrotic tissue within the wound bed including Eschar and Adherent Slough. The periwound skin appearance had no abnormalities noted for texture. The periwound skin appearance exhibited: Dry/Scaly, Hemosiderin Staining. Periwound temperature was noted as No Abnormality. Wound #2 status is Open. Original cause of wound was Gradually Appeared. The date acquired was: 02/03/2022. The wound is located on the Left Gluteal fold. The wound measures 1cm length x 0.8cm width x 0.1cm depth; 0.628cm^2 area and 0.063cm^3  volume. There is Fat Layer (Subcutaneous Tissue) exposed. There is no tunneling or undermining noted. There is a medium amount of serosanguineous drainage noted. The wound margin is distinct with the outline attached to the wound base. There is small (1-33%) red, pink  granulation within the wound bed. There is a large (67-100%) amount of necrotic tissue within the wound bed including Eschar and Adherent Slough. The periwound skin appearance had no abnormalities noted for texture. The periwound skin appearance had no abnormalities noted for moisture. The periwound skin appearance had no abnormalities noted for color. Periwound temperature was noted as No Abnormality. Wound #3 status is Open. Original cause of wound was Blister. The date acquired was: 01/07/2022. The wound is located on the Right,Anterior Lower Leg. The wound measures 0.3cm length x 0.5cm width x 0.1cm depth; 0.118cm^2 area and 0.012cm^3 volume. There is Fat Layer (Subcutaneous Tissue) exposed. There is no tunneling or undermining noted. There is a medium amount of serosanguineous drainage noted. The wound margin is distinct with the outline attached to the wound base. There is small (1-33%) red, pink granulation within the wound bed. There is a large (67-100%) amount of necrotic tissue within the wound bed including Eschar and Adherent Slough. The periwound skin appearance had no abnormalities noted for texture. The periwound skin appearance exhibited: Dry/Scaly, Hemosiderin Staining. Periwound temperature was noted as No Abnormality. 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 There was a Excisional Skin/Subcutaneous Tissue Debridement with a total area of 20 sq cm performed by Kimberly Maudlin, MD. With the following instrument(s): Curette to remove Non-Viable tissue/material. Material removed includes Subcutaneous Tissue and Slough and after achieving pain control using Lidocaine 4% T opical Solution. No specimens were taken. A time out was conducted at 14:12, prior to the start of the procedure. A Minimum amount of bleeding was controlled with Pressure. The procedure was tolerated well. Post Debridement Measurements: 8cm length x 7cm width x 0.1cm depth; 4.398cm^3 volume. Character of Wound/Ulcer Post Debridement is improved. Post procedure Diagnosis Wound #1: Same as Pre-Procedure General Notes: scribed for Dr. Celine [name] by Kimberly Peals, RN. Pre-procedure diagnosis of Wound #1 is a Lymphedema located on the Right,Posterior Lower Leg . There was a Three Layer Compression Therapy Procedure by Kimberly Peals, RN. [place], Jaryn [name] ([id number]) [id number] Page 9 of 13 Post procedure Diagnosis Wound #1: Same as Pre-Procedure Wound #2 Pre-procedure diagnosis of Wound #2 is an Abrasion located on the Left Gluteal fold . There was a Excisional Skin/Subcutaneous Tissue Debridement with a total area of 0.8 sq cm performed by Kimberly Maudlin, MD. With the following instrument(s): Curette to remove Non-Viable tissue/material. Material removed includes Subcutaneous Tissue and Slough and after achieving pain control using Lidocaine 4% T opical Solution. No specimens were taken. A time out was conducted at 14:12, prior to the start of the procedure. A Minimum amount of bleeding was controlled with Pressure. The procedure was tolerated well. Post Debridement Measurements: 1cm length x 0.8cm width x 0.1cm  depth; 0.063cm^3 volume. Character of Wound/Ulcer Post Debridement is improved. Post procedure Diagnosis Wound #2: Same as Pre-Procedure General Notes: scribed for Dr. Celine [name] by Kimberly Peals, RN. Wound #3 Pre-procedure diagnosis of Wound #3 is a Lymphedema located on the Right,Anterior Lower Leg . There was a Selective/Open Wound Non-Viable Tissue Debridement with a total area of 0.15 sq cm  performed by Kimberly Maudlin, MD. With the following instrument(s): Curette to remove Non-Viable tissue/material. Material removed includes Eschar and Slough and after achieving pain control using Lidocaine 4% Topical Solution. No specimens were taken. A time out was conducted at 14:12, prior to the start of the procedure. A Minimum amount of bleeding was controlled with Pressure. The procedure was tolerated well. Post Debridement Measurements: 0.3cm length x 0.5cm width x 0.1cm depth; 0.012cm^3 volume. Character of Wound/Ulcer Post Debridement is improved. Post procedure Diagnosis Wound #3: Same as Pre-Procedure General Notes: scribed for Dr. Celine [name] by Kimberly Peals, RN. There was a Three Layer Compression Therapy Procedure by Kimberly Peals, RN. Post procedure Diagnosis Wound #: Same as Pre-Procedure Plan Follow-up Appointments: Return Appointment in 1 week. Anesthetic: (In clinic) Topical Lidocaine 4% applied to wound bed Bathing/ Shower/ Hygiene: May shower with protection but do not get wound dressing(s) wet. Protect dressing(s) with water repellant cover (for example, large plastic bag) or a cast cover and may then take shower. - legs May shower and wash wound with soap and water. - bottom Edema Control - Lymphedema / SCD / Other: Elevate legs to the level of the heart or above for 30 minutes daily and/or when sitting for 3-4 times a day throughout the day. Avoid standing for long periods of time. Moisturize legs daily. If compression wraps slide down please call wound center and  speak with a nurse. Non Wound Condition: Other Non Wound Condition Orders/Instructions: - 3 layer wrap The following medication(s) was prescribed: lidocaine topical 4 % cream cream topical was prescribed at facility WOUND #1: - Lower Leg Wound Laterality: Right, Posterior Cleanser: Soap and Water 1 x Per Week/30 Days Discharge Instructions: May shower and wash wound with dial antibacterial soap and water prior to dressing change. Cleanser: Wound Cleanser 1 x Per Week/30 Days Discharge Instructions: Cleanse the wound with wound cleanser prior to applying a clean dressing using gauze sponges, not tissue or cotton balls. Peri-Wound Care: Sween Lotion (Moisturizing lotion) 1 x Per Week/30 Days Discharge Instructions: Apply moisturizing lotion as directed Prim Dressing: Sorbalgon AG Dressing, 4x4 (in/in) 1 x Per Week/30 Days ary Discharge Instructions: Apply to wound bed as instructed Secondary Dressing: ABD Pad, 5x9 1 x Per Week/30 Days Discharge Instructions: Apply over primary dressing as directed. Secondary Dressing: Woven Gauze Sponge, Non-Sterile 4x4 in 1 x Per Week/30 Days Discharge Instructions: Apply over primary dressing as directed. Com pression Wrap: ThreePress (3 layer compression wrap) 1 x Per Week/30 Days Discharge Instructions: Apply three layer compression as directed. WOUND #2: - Gluteal fold Wound Laterality: Left Cleanser: Soap and Water 1 x Per Day/30 Days Discharge Instructions: May shower and wash wound with dial antibacterial soap and water prior to dressing change. Cleanser: Wound Cleanser 1 x Per Day/30 Days Discharge Instructions: Cleanse the wound with wound cleanser prior to applying a clean dressing using gauze sponges, not tissue or cotton balls. Prim Dressing: Sorbalgon AG Dressing 2x2 (in/in) 1 x Per Day/30 Days ary Discharge Instructions: Apply to wound bed as instructed Secondary Dressing: Zetuvit Plus Silicone Border Dressing 4x4 (in/in) 1 x Per Day/30  Days Discharge Instructions: Apply silicone border over primary dressing as directed. WOUND #3: - Lower Leg Wound Laterality: Right, Anterior Cleanser: Soap and Water 1 x Per Week/30 Days Discharge Instructions: May shower and wash wound with dial antibacterial soap and water prior to dressing change. Cleanser: Wound Cleanser 1 x Per Week/30 Days Discharge Instructions: Cleanse the wound with wound cleanser prior to applying a  clean dressing using gauze sponges, not tissue or cotton balls. Peri-Wound Care: Sween Lotion (Moisturizing lotion) 1 x Per Week/30 Days Discharge Instructions: Apply moisturizing lotion as directed Prim Dressing: Sorbalgon AG Dressing, 4x4 (in/in) 1 x Per Week/30 Days ary Discharge Instructions: Apply to wound bed as instructed Secondary Dressing: ABD Pad, 5x9 1 x Per Week/30 Days Discharge Instructions: Apply over primary dressing as directed. Secondary Dressing: Woven Gauze Sponge, Non-Sterile 4x4 in 1 x Per Week/30 Days Discharge Instructions: Apply over primary dressing as directed. Com pression Wrap: ThreePress (3 layer compression wrap) 1 x Per Week/30 Days Discharge Instructions: Apply three layer compression as directed. [name], [name] ([id number]) [id number] Page 10 of 13 04/29/2022: This is an 89 year old diabetic who likely has other undiagnosed chronic health conditions. She has 2+ pitting edema bilaterally with bilateral erythema suggestive of stasis dermatitis. She also has open wounds on her right lower extremity on both the anterior, posterior, and medial aspects. They vary in depth but all have slough and nonviable subcutaneous tissue present. She has a small pressure ulcer on her left gluteus, just adjacent to the natal cleft. There is slough and eschar accumulation here, as well. I used a curette to debride slough, eschar, and nonviable subcutaneous tissue from her wounds. We are going to apply silver alginate to all of her  wounds and use a foam border dressing on her gluteal wound and 3 layer compression on her leg. She needs compression on her left leg as well so we will wrap it in 3 layer compression. She was advised of the need to elevate her legs throughout the day. We will work on getting home health to help her with the gluteal wound. Follow-up in 1 week. Electronic Signature(s) Signed: 04/29/2022 2:53:32 PM By: Kimberly Maudlin MD FACS Entered By: [name] on 04/29/2022 14:53:32 -------------------------------------------------------------------------------- HxROS Details Patient Name: Date of Service: [name], [name]. 04/29/2022 1:15 PM Medical Record Number: [id number] Patient Account Number: [id number] Date of Birth/Sex: Treating RN: 1932-07-25 (89 y.o. [name] Primary Care Provider: Dellis [name] Other Clinician: Referring Provider: Treating Provider/Extender: [name] in Treatment: 0 Information Obtained From Patient Constitutional Symptoms (General Health) Complaints and Symptoms: Negative for: Fatigue; Fever; Chills; Marked Weight Change Ear/Nose/Mouth/Throat Complaints and Symptoms: Negative for: Chronic sinus problems or rhinitis Respiratory Complaints and Symptoms: Negative for: Chronic or frequent coughs; Shortness of Breath Gastrointestinal Complaints and Symptoms: Negative for: Frequent diarrhea; Nausea; Vomiting Genitourinary Complaints and Symptoms: Negative for: Frequent urination Integumentary (Skin) Complaints and Symptoms: Positive for: Wounds Musculoskeletal Complaints and Symptoms: Negative for: Muscle Pain; Muscle Weakness Medical History: Positive for: Osteoarthritis Neurologic Complaints and Symptoms: [name], [name] ([id number]) [phone].pdf Page 11 of 13 Negative for: Numbness/parasthesias Psychiatric Complaints and Symptoms: Negative for: Claustrophobia Eyes Medical  History: Positive for: Cataracts Past Medical History Notes: diabetic retinopathy, macular degeneration Hematologic/Lymphatic Cardiovascular Medical History: Positive for: Hypertension Endocrine Medical History: Positive for: Type II Diabetes Time with diabetes: 56 yrs Treated with: Insulin Blood sugar tested every day: Yes Tested : Immunological Oncologic HBO Extended History Items Eyes: Cataracts Immunizations Pneumococcal Vaccine: Received Pneumococcal Vaccination: No Implantable Devices None Hospitalization / Surgery History Type of Hospitalization/Surgery cataract extraction rectocele repair abdominal hysterectomy breast surgery c-eye surgery procedure eye surgery Family and Social History Never smoker; Marital Status - Widowed; Alcohol Use: Never; Drug Use: No History; Caffeine Use: Never; Financial Concerns: No; Food, Clothing or Shelter Needs: No; Support System Lacking: No; Transportation Concerns: No Electronic Signature(s) Signed: 04/29/2022  2:57:10 PM By: Kimberly Maudlin MD FACS Signed: 04/29/2022 3:59:24 PM By: [name] Entered By: [name] on 04/29/2022 13:36:24 SuperBill Details -------------------------------------------------------------------------------- [name] ([id number]) [id number] Page 12 of 13 Patient Name: Date of Service: [facility], [name] 04/29/2022 Medical Record Number: [id number] Patient Account Number: [id number] Date of Birth/Sex: Treating RN: [birth year] (89 y.o. 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 L89.322 Pressure ulcer of left buttock, stage  2 E11.622 Type 2 diabetes mellitus with other skin ulcer E11.40 Type 2 diabetes mellitus with diabetic neuropathy, unspecified I73.9 Peripheral vascular disease, unspecified H91.93 Unspecified hearing loss, bilateral R60.0 Localized edema Facility Procedures CPT4 Code Description Modifier Quantity [id number] - WOUND CARE VISIT-LEV 3 EST PT 25 1 [id number] 11042 - DEB SUBQ TISSUE 20 SQ CM/< 1 ICD-10 Diagnosis Description L97.212 Non-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 L97.812 Non-pressure chronic 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 [id number] 11045 - DEB SUBQ TISS EA ADDL 20CM 1 ICD-10 Diagnosis Description L97.812 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 [id number] - DEBRIDE WOUND 1ST 20 SQ CM OR < 1 ICD-10 Diagnosis Description L89.322 Pressure ulcer of left buttock, stage 2 Physician Procedures Quantity CPT4 Code Description Modifier [id number] - WC PHYS LEVEL 4 - NEW PT 25 1 ICD-10 Diagnosis Description L97.812 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 L89.322 Pressure ulcer of left buttock, stage 2 E11.622 Type 2 diabetes mellitus with other skin ulcer [id number] 11042 - WC PHYS SUBQ TISS 20 SQ CM 1 ICD-10 Diagnosis Description L97.212 Non-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 L97.812 Non-pressure chronic 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 [id number] 11045 - WC PHYS SUBQ TISS EA ADDL 20 CM 1 ICD-10 Diagnosis Description L97.812 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

## 2022-04-30 NOTE — Progress Notes (Signed)
MARYKATHLEEN, RUSSI ([id number]) [id number] Page 1 of 12 Visit Report for 04/29/2022 Allergy List Details Patient Name: Date of Service: [place] [name], [name] 04/29/2022 1:15 PM Medical Record Number: [id number] Patient Account Number: [id number] Date of Birth/Sex: Treating RN: [birth year] (89 y.o. [name] Primary Care Angelena Sand: Dellis Filbert Other Clinician: Referring Joaquim Tolen: Treating Akire Rennert/Extender: Toy Baker Weeks in Treatment: 0 Allergies Active Allergies No Known Allergies Allergy Notes Electronic Signature(s) Signed: 04/29/2022 3:59:24 PM By: Adline Peals Entered By: Adline Peals on 04/29/2022 13:31:35 -------------------------------------------------------------------------------- Arrival Information Details Patient Name: Date of Service: [name], [name] 04/29/2022 1:15 PM Medical Record Number: [id number] Patient Account Number: [id number] Date of Birth/Sex: Treating RN: Sep 09, 1932 (89 y.o. [name] Primary Care Dontae Minerva: Dellis Filbert Other Clinician: Referring Modene Andy: Treating Letesha Klecker/Extender: [name] in Treatment: 0 Visit Information Patient Arrived: Ambulatory Arrival Time: 13:29 Accompanied By: son Transfer Assistance: None Patient Identification Verified: Yes Secondary Verification Process Completed: Yes Electronic Signature(s) Signed: 04/29/2022 3:59:24 PM By: Adline Peals Entered By: Adline Peals on 04/29/2022 13:30:11 -------------------------------------------------------------------------------- Clinic Level of Care Assessment Details Patient Name: Date of Service: [facility], [name] TTIE B. 04/29/2022 1:15 PM Medical Record Number: [id number] Patient Account Number: [id number] Date of Birth/Sex: Treating RN: 1932-09-26 (89 y.o. [name] Primary Care Skylee Baird: Dellis Filbert Other Clinician: NIKCOLE, EISCHEID ([id number])  [id number] Page 2 of 12 Referring Victoria Henshaw: Treating Kristina Bertone/Extender: [name] in Treatment: 0 Clinic Level of Care Assessment Items TOOL 1 Quantity Score X- 1 0 Use when EandM and Procedure is performed on INITIAL visit ASSESSMENTS - Nursing Assessment / Reassessment X- 1 20 General Physical Exam (combine w/ comprehensive assessment (listed just below) when performed on new pt. evals) X- 1 25 Comprehensive Assessment (HX, ROS, Risk Assessments, Wounds Hx, etc.) ASSESSMENTS - Wound and Skin Assessment / Reassessment '[]'$  - 0 Dermatologic / Skin Assessment (not related to wound area) ASSESSMENTS - Ostomy and/or Continence Assessment and Care '[]'$  - 0 Incontinence Assessment and Management '[]'$  - 0 Ostomy Care Assessment and Management (repouching, etc.) PROCESS - Coordination of Care X - Simple Patient / Family Education for ongoing care 1 15 '[]'$  - 0 Complex (extensive) Patient / Family Education for ongoing care X- 1 10 Staff obtains Programmer, systems, Records, T Results / Process Orders est '[]'$  - 0 Staff telephones HHA, Nursing Homes / Clarify orders / etc '[]'$  - 0 Routine Transfer to another Facility (non-emergent condition) '[]'$  - 0 Routine Hospital Admission (non-emergent condition) X- 1 15 New Admissions / Biomedical engineer / Ordering NPWT Apligraf, etc. , '[]'$  - 0 Emergency Hospital Admission (emergent condition) PROCESS - Special Needs '[]'$  - 0 Pediatric / Minor Patient Management '[]'$  - 0 Isolation Patient Management '[]'$  - 0 Hearing / Language / Visual special needs '[]'$  - 0 Assessment of Community assistance (transportation, D/C planning, etc.) '[]'$  - 0 Additional assistance / Altered mentation '[]'$  - 0 Support Surface(s) Assessment (bed, cushion, seat, etc.) INTERVENTIONS - Miscellaneous '[]'$  - 0 External ear exam '[]'$  - 0 Patient Transfer (multiple staff / Civil Service fast streamer / Similar devices) '[]'$  - 0 Simple Staple / Suture removal  (25 or less) '[]'$  - 0 Complex Staple / Suture removal (26 or more) '[]'$  - 0 Hypo/Hyperglycemic Management (do not check if billed separately) X- 1 15 Ankle / Brachial Index (ABI) - do not check if billed separately Has the patient been seen at the hospital within the last three years:  Yes Total Score: 100 Level Of Care: New/Established - Level 3 Electronic Signature(s) Signed: 04/29/2022 3:59:24 PM By: Adline Peals Entered By: Adline Peals on 04/29/2022 15:20:12 Compression Therapy Details -------------------------------------------------------------------------------- [name] ([id number]) [id number].pdf Page 3 of 12 Patient Name: Date of Service: [facility] [name], [name] [name] 04/29/2022 1:15 PM Medical Record Number: [id number] Patient Account Number: [id number] Date of Birth/Sex: Treating RN: [birth year] (89 y.o. [name] Primary Care Maxine Fredman: Dellis Filbert Other Clinician: Referring Marialuisa Basara: Treating Keandrea Tapley/Extender: Toy Baker Weeks in Treatment: 0 Compression Therapy Performed for Wound Assessment: NonWound Condition Lymphedema - Left Leg Performed By: Clinician Adline Peals, RN Compression Type: Three Layer Post Procedure Diagnosis Same as Pre-procedure Electronic Signature(s) Signed: 04/29/2022 3:59:24 PM By: Adline Peals Entered By: Adline Peals on 04/29/2022 14:22:28 -------------------------------------------------------------------------------- Compression Therapy Details Patient Name: Date of Service: [name], [name] 04/29/2022 1:15 PM Medical Record Number: [id number] Patient Account Number: [id number] Date of Birth/Sex: Treating RN: December 31, 1932 (89 y.o. [name] Primary Care Nelani Schmelzle: Dellis Filbert Other Clinician: Referring Herminia Warren: Treating Dasani Thurlow/Extender: [name] in Treatment: 0 Compression Therapy Performed for Wound Assessment: Wound  #1 Right,Posterior Lower Leg Performed By: Clinician Adline Peals, RN Compression Type: Three Layer Post Procedure Diagnosis Same as Pre-procedure Electronic Signature(s) Signed: 04/29/2022 3:59:24 PM By: Adline Peals Entered By: Adline Peals on 04/29/2022 14:22:39 -------------------------------------------------------------------------------- Encounter Discharge Information Details Patient Name: Date of Service: [name], [name] 04/29/2022 1:15 PM Medical Record Number: [id number] Patient Account Number: [id number] Date of Birth/Sex: Treating RN: 1932-09-17 (89 y.o. [name] Primary Care Santosh Petter: Dellis Filbert Other Clinician: Referring Dhrithi Riche: Treating Zayanna Pundt/Extender: [name] in Treatment: 0 Encounter Discharge Information Items Post Procedure Vitals Discharge Condition: Stable Temperature (F): 98.2 Ambulatory Status: Wheelchair Pulse (bpm): 88 Discharge Destination: Home Respiratory Rate (breaths/min): 20 Transportation: Private Auto Blood Pressure (mmHg): 183/70 Accompanied By: son Schedule Follow-up Appointment: Yes Clinical Summary of Care: Patient Declined Electronic Signature(s) Signed: 04/29/2022 3:59:24 PM By: Milagros Evener, Casilda B ([id number]) PM By: Manual Meier.pdf Page 4 of 12 Signed: 04/29/2022 3:59:24 aylor Entered By: Adline Peals on 04/29/2022 15:20:56 -------------------------------------------------------------------------------- Lower Extremity Assessment Details Patient Name: Date of Service: [facility], [name] TTIE B. 04/29/2022 1:15 PM Medical Record Number: [id number] Patient Account Number: [id number] Date of Birth/Sex: Treating RN: [birth year] (89 y.o. [name] Primary Care Camie Hauss: Dellis Filbert Other Clinician: Referring Mehkai Gallo: Treating Nohemy Koop/Extender: [name] in Treatment: 0 Edema  Assessment Assessed: [Left: No] [Right: No] [Left: Edema] [Right: :] Calf Left: Right: Point of Measurement: From Medial Instep 38 cm 37.6 cm Ankle Left: Right: Point of Measurement: From Medial Instep 22.5 cm 23 cm Knee To Floor Left: Right: From Medial Instep 43 cm 43 cm Vascular Assessment Pulses: Dorsalis Pedis Palpable: [Left:Yes] [Right:Yes] Blood Pressure: Brachial: [Right:183] Ankle: [Right:Posterior Tibial: 160 0.87] Electronic Signature(s) Signed: 04/29/2022 3:59:24 PM By: Adline Peals Entered By: Adline Peals on 04/29/2022 14:06:17 -------------------------------------------------------------------------------- Multi Wound Chart Details Patient Name: Date of Service: [name], [name] 04/29/2022 1:15 PM Medical Record Number: [id number] Patient Account Number: [id number] Date of Birth/Sex: Treating RN: 1933-03-31 (89 y.o. F) Primary Care Nikira Kushnir: Dellis Filbert Other Clinician: Referring Trashaun Streight: Treating Jamier Urbas/Extender: [name] in Treatment: 0 Vital Signs Height(in): Pulse(bpm): 88 Weight(lbs): Blood Pressure(mmHg): 183/70 Body Mass Index(BMI): Temperature(F): 98.2 Respiratory Rate(breaths/min): 20 KENEDIE, DIROCCO B ([id number]) [phone].pdf Page 5 of 12 [1:Photos: No  Photos Right, Posterior Lower Leg Wound Location: Blister Wounding Event: Lymphedema Primary Etiology: Cataracts, Hypertension, Type II Comorbid History: Diabetes, Osteoarthritis 01/05/2022 Date Acquired: 0 Weeks of Treatment: Open Wound Status: No  Wound Recurrence: 8x7x0.1 Measurements L x W x D (cm) 43.982 A (cm) : rea 4.398 Volume (cm) : Full Thickness Without Exposed Classification: Support Structures Medium Exudate A mount: Serosanguineous Exudate Type: red, brown Exudate Color: Distinct,  outline attached Wound Margin: Small (1-33%) Granulation A mount: Red, Pink Granulation Quality: Large (67-100%) Necrotic A mount:  Eschar, Adherent Slough Necrotic Tissue: Fat Layer (Subcutaneous Tissue): Yes Fat Layer (Subcutaneous Tissue): Yes Fat  Layer (Subcutaneous Tissue): Yes Exposed Structures: Fascia: No Tendon: No Muscle: No Joint: No Bone: No None Epithelialization: Debridement - Excisional Debridement: Pre-procedure Verification/Time Out 14:12 Taken: Lidocaine 4% Topical Solution Pain  Control: Subcutaneous, Slough Tissue Debrided: Skin/Subcutaneous Tissue Level: 20 Debridement A (sq cm): rea Curette Instrument: Minimum Bleeding: Pressure Hemostasis A chieved: Procedure was tolerated well Debridement Treatment Response: 8x7x0.1 Post  Debridement Measurements L x W x D (cm) 4.398 Post Debridement Volume: (cm) No Abnormalities Noted Periwound Skin Texture: Dry/Scaly: Yes Periwound Skin Moisture: Hemosiderin Staining: Yes Periwound Skin Color: No Abnormality Temperature: Compression  Therapy Procedures Performed: Debridement] [2:No Photos Left Gluteal fold Gradually Appeared Abrasion Cataracts, Hypertension, Type II Diabetes, Osteoarthritis 02/03/2022 0 Open No 1x0.8x0.1 0.628 0.063 Full Thickness Without Exposed Support Structures  Medium Serosanguineous red, brown Distinct, outline attached Small (1-33%) Red, Pink Large (67-100%) Eschar, Adherent Slough Fascia: No Tendon: No Muscle: No Joint: No Bone: No Small (1-33%) - Excisional 14:12 Lidocaine 4% Topical Solution Subcutaneous,  Slough Skin/Subcutaneous Tissue 0.8 Curette Minimum Pressure Procedure was tolerated well 1x0.8x0.1 0.063 No Abnormalities Noted No Abnormalities Noted No Abnormalities Noted No Abnormality] [3:No Photos Right, Anterior Lower Leg Blister Lymphedema  Cataracts, Hypertension, Type II Diabetes, Osteoarthritis 01/07/2022 0 Open No 0.3x0.5x0.1 0.118 0.012 Full Thickness Without Exposed Support Structures Medium Serosanguineous red, brown Distinct, outline attached Small (1-33%) Red, Pink Large (67-100%)  Eschar, Adherent Slough Fascia: No Tendon: No  Muscle: No Joint: No Bone: No None Debridement - Selective/Open Wound 14:12 Lidocaine 4% Topical Solution Necrotic/Eschar, Slough Non-Viable Tissue 0.15 Curette Minimum Pressure Procedure was tolerated well  0.3x0.5x0.1 0.012 No Abnormalities Noted Dry/Scaly: Yes Hemosiderin Staining: Yes No Abnormality Debridement] Treatment Notes Electronic Signature(s) Signed: 04/29/2022 2:42:08 PM By: Fredirick Maudlin MD FACS Entered By: Fredirick Maudlin on 04/29/2022 14:42:07 -------------------------------------------------------------------------------- Multi-Disciplinary Care Plan Details Patient Name: Date of Service: [name], [name] 04/29/2022 1:15 PM Medical Record Number: [id number] Patient Account Number: [id number] Date of Birth/Sex: Treating RN: Jan 29, 1933 (89 y.o. [name] Primary Care Aleia Larocca: Dellis Filbert Other Clinician: Referring Delisia Mcquiston: Treating Jesilyn Easom/Extender: [name] in Treatment: [address] JAIME, DOME ([id number]) [id number] Page 6 of 12 Necrotic Tissue Nursing Diagnoses: Impaired tissue integrity related to necrotic/devitalized tissue Knowledge deficit related to management of necrotic/devitalized tissue Goals: Necrotic/devitalized tissue will be minimized in the wound bed Date Initiated: 04/29/2022 Target Resolution Date: 06/25/2022 Goal Status: Active Patient/caregiver will verbalize understanding of reason and process for debridement of necrotic tissue Date Initiated: 04/29/2022 Target Resolution Date: 06/25/2022 Goal Status: Active Interventions: Assess patient pain level pre-, during and post procedure and prior to discharge Provide education on necrotic tissue and debridement process Treatment Activities: Apply topical anesthetic as ordered : 04/29/2022 Notes: Wound/Skin Impairment Nursing Diagnoses: Impaired tissue integrity Knowledge deficit related to ulceration/compromised skin  integrity Goals: Patient/caregiver will verbalize understanding of skin care regimen  Date Initiated: 04/29/2022 Target Resolution Date: 06/25/2022 Goal Status: Active Interventions: Assess ulceration(s) every visit Treatment Activities: Skin care regimen initiated : 04/29/2022 Topical wound management initiated : 04/29/2022 Notes: Electronic Signature(s) Signed: 04/29/2022 3:59:24 PM By: Adline Peals Entered By: Adline Peals on 04/29/2022 15:19:37 -------------------------------------------------------------------------------- Pain Assessment Details Patient Name: Date of Service: [name], [name] 04/29/2022 1:15 PM Medical Record Number: [id number] Patient Account Number: [id number] Date of Birth/Sex: Treating RN: Jun 19, 1932 (89 y.o. [name] Primary Care Kierria Feigenbaum: Dellis Filbert Other Clinician: Referring Malvika Tung: Treating Spike Desilets/Extender: [name] in Treatment: 0 Active Problems Location of Pain Severity and Description of Pain Patient Has Paino Yes Site Locations Pain Location: SINDIA, KOWALCZYK ([id number]) [phone].pdf Page 7 of 12 Pain Location: Pain in Ulcers Rate the pain. Current Pain Level: 5 Pain Management and Medication Current Pain Management: Electronic Signature(s) Signed: 04/29/2022 3:59:24 PM By: Adline Peals Entered By: Adline Peals on 04/29/2022 13:58:40 -------------------------------------------------------------------------------- Patient/Caregiver Education Details Patient Name: Date of Service: Letitia Neri, [place] [name] 1/11/2024andnbsp1:15 PM Medical Record Number: [id number] Patient Account Number: [id number] Date of Birth/Gender: Treating RN: [birth year] (89 y.o. [name] Primary Care Physician: Dellis Filbert Other Clinician: Referring Physician: Treating Physician/Extender: [name] in Treatment: 0 Education  Assessment Education Provided To: Patient Education Topics Provided Welcome T [facility]New [facility] Packet: o Methods: Explain/Verbal Responses: Reinforcements needed, State content correctly Wound Debridement: Methods: Explain/Verbal Responses: Reinforcements needed, State content correctly Electronic Signature(s) Signed: 04/29/2022 3:59:24 PM By: Adline Peals Entered By: Adline Peals on 04/29/2022 15:19:49 -------------------------------------------------------------------------------- Wound Assessment Details Patient Name: Date of Service: [name], [place] TTIE B. 04/29/2022 1:15 PM KORIN, SETZLER B ([id number]) [id number] Page 8 of 12 Medical Record Number: [id number] Patient Account Number: [id number] Date of Birth/Sex: Treating RN: 29-Apr-1932 (89 y.o. [name] Primary Care Teigen Parslow: Dellis Filbert Other Clinician: Referring Elley Harp: Treating Lashawna Poche/Extender: Toy Baker Weeks in Treatment: 0 Wound Status Wound Number: 1 Primary Etiology: Lymphedema Wound Location: Right, Posterior Lower Leg Wound Status: Open Wounding Event: Blister Comorbid History: Cataracts, Hypertension, Type II Diabetes, Osteoarthritis Date Acquired: 01/05/2022 Weeks Of Treatment: 0 Clustered Wound: No Wound Measurements Length: (cm) 8 Width: (cm) 7 Depth: (cm) 0.1 Area: (cm) 43.982 Volume: (cm) 4.398 % Reduction in Area: % Reduction in Volume: Epithelialization: None Tunneling: No Undermining: No Wound Description Classification: Full Thickness Without Exposed Support Structures Wound Margin: Distinct, outline attached Exudate Amount: Medium Exudate Type: Serosanguineous Exudate Color: red, brown Foul Odor After Cleansing: No Slough/Fibrino Yes Wound Bed Granulation Amount: Small (1-33%) Exposed Structure Granulation Quality: Red, Pink Fascia Exposed: No Necrotic Amount: Large (67-100%) Fat Layer  (Subcutaneous Tissue) Exposed: Yes Necrotic Quality: Eschar, Adherent Slough Tendon Exposed: No Muscle Exposed: No Joint Exposed: No Bone Exposed: No Periwound Skin Texture Texture Color No Abnormalities Noted: Yes No Abnormalities Noted: No Hemosiderin Staining: Yes Moisture No Abnormalities Noted: No Temperature / Pain Dry / Scaly: Yes Temperature: No Abnormality Treatment Notes Wound #1 (Lower Leg) Wound Laterality: Right, Posterior Cleanser Soap and Water Discharge Instruction: May shower and wash wound with dial antibacterial soap and water prior to dressing change. Wound Cleanser Discharge Instruction: Cleanse the wound with wound cleanser prior to applying a clean dressing using gauze sponges, not tissue or cotton balls. Peri-Wound Care Sween Lotion (Moisturizing lotion) Discharge Instruction: Apply moisturizing lotion as directed Topical Primary Dressing Sorbalgon AG Dressing, 4x4 (in/in) Discharge Instruction: Apply to wound bed as instructed Secondary Dressing  ABD Pad, 5x9 Discharge Instruction: Apply over primary dressing as directed. Woven Gauze Sponge, Non-Sterile 4x4 in Discharge Instruction: Apply over primary dressing as directed. Secured With [employer], Sumayah B ([id number]) [id number] Page 9 of 12 ThreePress (3 layer compression wrap) Discharge Instruction: Apply three layer compression as directed. Compression Stockings Add-Ons Electronic Signature(s) Signed: 04/29/2022 3:59:24 PM By: Adline Peals Entered By: Adline Peals on 04/29/2022 13:48:08 -------------------------------------------------------------------------------- Wound Assessment Details Patient Name: Date of Service: [name], [name] 04/29/2022 1:15 PM Medical Record Number: [id number] Patient Account Number: [id number] Date of Birth/Sex: Treating RN: 04-Nov-1932 (89 y.o. [name] Primary Care Domini Vandehei: Dellis Filbert Other  Clinician: Referring Mariadejesus Cade: Treating Geetika Laborde/Extender: Toy Baker Weeks in Treatment: 0 Wound Status Wound Number: 2 Primary Etiology: Abrasion Wound Location: Left Gluteal fold Wound Status: Open Wounding Event: Gradually Appeared Comorbid History: Cataracts, Hypertension, Type II Diabetes, Osteoarthritis Date Acquired: 02/03/2022 Weeks Of Treatment: 0 Clustered Wound: No Wound Measurements Length: (cm) 1 Width: (cm) 0.8 Depth: (cm) 0.1 Area: (cm) 0.628 Volume: (cm) 0.063 % Reduction in Area: % Reduction in Volume: Epithelialization: Small (1-33%) Tunneling: No Undermining: No Wound Description Classification: Full Thickness Without Exposed Support Structures Wound Margin: Distinct, outline attached Exudate Amount: Medium Exudate Type: Serosanguineous Exudate Color: red, brown Foul Odor After Cleansing: No Slough/Fibrino Yes Wound Bed Granulation Amount: Small (1-33%) Exposed Structure Granulation Quality: Red, Pink Fascia Exposed: No Necrotic Amount: Large (67-100%) Fat Layer (Subcutaneous Tissue) Exposed: Yes Necrotic Quality: Eschar, Adherent Slough Tendon Exposed: No Muscle Exposed: No Joint Exposed: No Bone Exposed: No Periwound Skin Texture Texture Color No Abnormalities Noted: Yes No Abnormalities Noted: Yes Moisture Temperature / Pain No Abnormalities Noted: Yes Temperature: No Abnormality Treatment Notes Wound #2 (Gluteal fold) Wound Laterality: Left Cleanser Soap and Water Discharge Instruction: May shower and wash wound with dial antibacterial soap and water prior to dressing change. Wound Cleanser TASHAYA, ANCRUM B ([id number]) [id number] Page 10 of 12 Discharge Instruction: Cleanse the wound with wound cleanser prior to applying a clean dressing using gauze sponges, not tissue or cotton balls. Peri-Wound Care Topical Primary Dressing Sorbalgon AG Dressing 2x2 (in/in) Discharge Instruction:  Apply to wound bed as instructed Secondary Dressing Zetuvit Plus Silicone Border Dressing 4x4 (in/in) Discharge Instruction: Apply silicone border over primary dressing as directed. Secured With Compression Wrap Compression Stockings Add-Ons Electronic Signature(s) Signed: 04/29/2022 3:59:24 PM By: Adline Peals Entered By: Adline Peals on 04/29/2022 13:53:11 -------------------------------------------------------------------------------- Wound Assessment Details Patient Name: Date of Service: [name], [name] 04/29/2022 1:15 PM Medical Record Number: [id number] Patient Account Number: [id number] Date of Birth/Sex: Treating RN: 1932-07-15 (89 y.o. [name] Primary Care Riggin Cuttino: Dellis Filbert Other Clinician: Referring Kelsha Older: Treating Hazaiah Edgecombe/Extender: Toy Baker Weeks in Treatment: 0 Wound Status Wound Number: 3 Primary Etiology: Lymphedema Wound Location: Right, Anterior Lower Leg Wound Status: Open Wounding Event: Blister Comorbid History: Cataracts, Hypertension, Type II Diabetes, Osteoarthritis Date Acquired: 01/07/2022 Weeks Of Treatment: 0 Clustered Wound: No Wound Measurements Length: (cm) 0.3 Width: (cm) 0.5 Depth: (cm) 0.1 Area: (cm) 0.118 Volume: (cm) 0.012 % Reduction in Area: % Reduction in Volume: Epithelialization: None Tunneling: No Undermining: No Wound Description Classification: Full Thickness Without Exposed Support Structures Wound Margin: Distinct, outline attached Exudate Amount: Medium Exudate Type: Serosanguineous Exudate Color: red, brown Foul Odor After Cleansing: No Slough/Fibrino Yes Wound Bed Granulation Amount: Small (1-33%) Exposed Structure Granulation Quality: Red, Pink Fascia Exposed: No Necrotic Amount: Large (67-100%) Fat Layer (Subcutaneous Tissue) Exposed:  Yes Necrotic Quality: Eschar, Adherent Slough Tendon Exposed: No Muscle Exposed: No Joint Exposed: No Bone Exposed:  No [address] MONAYE, BLACKIE B ([id number]) [id number] Page 11 of 12 Texture Color No Abnormalities Noted: Yes No Abnormalities Noted: No Hemosiderin Staining: Yes Moisture No Abnormalities Noted: No Temperature / Pain Dry / Scaly: Yes Temperature: No Abnormality Treatment Notes Wound #3 (Lower Leg) Wound Laterality: Right, Anterior Cleanser Soap and Water Discharge Instruction: May shower and wash wound with dial antibacterial soap and water prior to dressing change. Wound Cleanser Discharge Instruction: Cleanse the wound with wound cleanser prior to applying a clean dressing using gauze sponges, not tissue or cotton balls. Peri-Wound Care Sween Lotion (Moisturizing lotion) Discharge Instruction: Apply moisturizing lotion as directed Topical Primary Dressing Sorbalgon AG Dressing, 4x4 (in/in) Discharge Instruction: Apply to wound bed as instructed Secondary Dressing ABD Pad, 5x9 Discharge Instruction: Apply over primary dressing as directed. Woven Gauze Sponge, Non-Sterile 4x4 in Discharge Instruction: Apply over primary dressing as directed. Secured With Compression Wrap ThreePress (3 layer compression wrap) Discharge Instruction: Apply three layer compression as directed. Compression Stockings Add-Ons Electronic Signature(s) Signed: 04/29/2022 3:59:24 PM By: Adline Peals Entered By: Adline Peals on 04/29/2022 13:54:10 -------------------------------------------------------------------------------- Vitals Details Patient Name: Date of Service: [name], [name] 04/29/2022 1:15 PM Medical Record Number: [id number] Patient Account Number: [id number] Date of Birth/Sex: Treating RN: Jul 12, 1932 (89 y.o. [name] Primary Care Mistee Soliman: Dellis Filbert Other Clinician: Referring Desare Duddy: Treating Reign Bartnick/Extender: Toy Baker Weeks in Treatment: 0 Vital Signs Time Taken: 13:30 Temperature  (F): 98.2 Pulse (bpm): 88 Respiratory Rate (breaths/min): 20 Blood Pressure (mmHg): 183/70 Reference Range: 80 - 120 mg / dl Electronic Signature(s) Signed: 04/29/2022 3:59:24 PM By: Milagros Evener, Chanise B ([id number]) [id number] Page 12 of 12 Entered By: Adline Peals on 04/29/2022 13:31:00

## 2022-05-06 ENCOUNTER — Encounter (HOSPITAL_BASED_OUTPATIENT_CLINIC_OR_DEPARTMENT_OTHER): Payer: Medicare Other | Admitting: General Surgery

## 2022-05-06 DIAGNOSIS — L97212 Non-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: Secondary | ICD-10-CM | POA: Diagnosis not present

## 2022-05-06 DIAGNOSIS — E1140 Type 2 diabetes mellitus with diabetic neuropathy, unspecified: Secondary | ICD-10-CM | POA: Diagnosis not present

## 2022-05-06 DIAGNOSIS — I129 Hypertensive chronic kidney disease with stage 1 through stage 4 chronic kidney disease, or unspecified chronic kidney disease: Secondary | ICD-10-CM | POA: Diagnosis not present

## 2022-05-06 DIAGNOSIS — S31829A Unspecified open wound of left buttock, initial encounter: Secondary | ICD-10-CM | POA: Diagnosis not present

## 2022-05-06 DIAGNOSIS — L97812 Non-pressure chronic 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: Secondary | ICD-10-CM | POA: Diagnosis not present

## 2022-05-06 DIAGNOSIS — E1122 Type 2 diabetes mellitus with diabetic chronic kidney disease: Secondary | ICD-10-CM | POA: Diagnosis not present

## 2022-05-06 DIAGNOSIS — I890 Lymphedema, not elsewhere classified: Secondary | ICD-10-CM | POA: Diagnosis not present

## 2022-05-06 DIAGNOSIS — L89322 Pressure ulcer of left buttock, stage 2: Secondary | ICD-10-CM | POA: Diagnosis not present

## 2022-05-06 DIAGNOSIS — N189 Chronic kidney disease, unspecified: Secondary | ICD-10-CM | POA: Diagnosis not present

## 2022-05-06 DIAGNOSIS — E11622 Type 2 diabetes mellitus with other skin ulcer: Secondary | ICD-10-CM | POA: Diagnosis not present

## 2022-05-06 DIAGNOSIS — E1151 Type 2 diabetes mellitus with diabetic peripheral angiopathy without gangrene: Secondary | ICD-10-CM | POA: Diagnosis not present

## 2022-05-06 NOTE — Progress Notes (Addendum)
[name] ([id number]) [id number] Page 1 of 10 Visit Report for 05/06/2022 Arrival Information Details Patient Name: Date of Service: [name], [name] 05/06/2022 2:45 PM Medical Record Number: [id number] Patient Account Number: [id number] Date of Birth/Sex: Treating RN: [birth year] (89 y.o. F) Primary Care Tyrica Afzal: Dellis Filbert Other Clinician: Referring Chavela Justiniano: Treating Cinthya Bors/Extender: [name] in Treatment: 1 Visit Information History Since Last Visit All ordered tests and consults were completed: No Patient Arrived: Ambulatory Added or deleted any medications: No Arrival Time: 15:27 Any new allergies or adverse reactions: No Accompanied By: daughter Had a fall or experienced change in No Transfer Assistance: Manual activities of daily living that may affect Patient Identification Verified: Yes risk of falls: Secondary Verification Process Completed: Yes Signs or symptoms of abuse/neglect since last visito No Hospitalized since last visit: No Implantable device outside of the clinic excluding No cellular tissue based products placed in the center since last visit: Pain Present Now: No Electronic Signature(s) Signed: 05/06/2022 5:46:07 PM By: Dellie Catholic RN Previous Signature: 05/06/2022 3:52:46 PM Version By: Worthy Rancher Entered By: Dellie Catholic on 05/06/2022 16:04:50 -------------------------------------------------------------------------------- Compression Therapy Details Patient Name: Date of Service: [name], [name]. 05/06/2022 2:45 PM Medical Record Number: [id number] Patient Account Number: [id number] Date of Birth/Sex: Treating RN: 1932/06/23 (89 y.o. [name] Primary Care Windsor Goeken: Dellis Filbert Other Clinician: Referring Paiten Boies: Treating Tiffney Haughton/Extender: [name] in Treatment: 1 Compression Therapy Performed for Wound Assessment: Wound #1  Right,Posterior Lower Leg Performed By: Clinician Dellie Catholic, RN Compression Type: Three Layer Post Procedure Diagnosis Same as Pre-procedure Electronic Signature(s) Signed: 05/06/2022 5:46:07 PM By: Dellie Catholic RN Entered By: Dellie Catholic on 05/06/2022 16:12:05 [name] ([id number]) [id number] Page 2 of 10 -------------------------------------------------------------------------------- Compression Therapy Details Patient Name: Date of Service: [name], [name] 05/06/2022 2:45 PM Medical Record Number: [id number] Patient Account Number: [id number] Date of Birth/Sex: Treating RN: [birth year] (89 y.o. [name] Primary Care Charmaine Placido: Dellis Filbert Other Clinician: Referring Ikran Patman: Treating Colleene Swarthout/Extender: [name] in Treatment: 1 Compression Therapy Performed for Wound Assessment: Non-Wound Location Performed By: Clinician Dellie Catholic, RN Compression Type: Three Layer Location: Lower Extremity, Left Post Procedure Diagnosis Same as Pre-procedure Electronic Signature(s) Signed: 05/06/2022 5:46:07 PM By: Dellie Catholic RN Entered By: Dellie Catholic on 05/06/2022 16:12:31 -------------------------------------------------------------------------------- Encounter Discharge Information Details Patient Name: Date of Service: [name], [name]. 05/06/2022 2:45 PM Medical Record Number: [id number] Patient Account Number: [id number] Date of Birth/Sex: Treating RN: 1932/10/22 (89 y.o. [name] Primary Care Jalyn Rosero: Dellis Filbert Other Clinician: Referring Zayvien Canning: Treating Edan Juday/Extender: [name] in Treatment: 1 Encounter Discharge Information Items Post Procedure Vitals Discharge Condition: Stable Temperature (F): 98 Ambulatory Status: Wheelchair Pulse (bpm): 81 Discharge Destination: Home Respiratory Rate (breaths/min): 20 Transportation: Private  Auto Blood Pressure (mmHg): 184/81 Accompanied By: Granddaughter Schedule Follow-up Appointment: Yes Clinical Summary of Care: Patient Declined Electronic Signature(s) Signed: 05/06/2022 5:46:07 PM By: Dellie Catholic RN Entered By: Dellie Catholic on 05/06/2022 17:41:34 -------------------------------------------------------------------------------- Lower Extremity Assessment Details Patient Name: Date of Service: [name], [name]. 05/06/2022 2:45 PM Medical Record Number: [id number] Patient Account Number: [id number] Date of Birth/Sex: Treating RN: 1932/06/01 (89 y.o. [name] Primary Care Dmarion Perfect: Dellis Filbert Other Clinician: Referring Areyanna Figeroa: Treating Jamison Soward/Extender: Toy Baker Weeks in Treatment: 1 Edema Assessment Assessed: [Left: No] [Right: No] [Left: Edema] [Right: :]  J[Left: AMES, [name] ([id number] [Right: [id number] Page 3 of 10] Calf Left: Right: Point of Measurement: From Medial Instep 38 cm 37.6 cm Ankle Left: Right: Point of Measurement: From Medial Instep 22.5 cm 23 cm Vascular Assessment Pulses: Dorsalis Pedis Palpable: [Left:Yes] [Right:Yes] Electronic Signature(s) Signed: 05/06/2022 5:46:07 PM By: Dellie Catholic RN Entered By: Dellie Catholic on 05/06/2022 15:53:12 -------------------------------------------------------------------------------- Multi Wound Chart Details Patient Name: Date of Service: [name], [name]. 05/06/2022 2:45 PM Medical Record Number: [id number] Patient Account Number: [id number] Date of Birth/Sex: Treating RN: 1932-09-05 (89 y.o. F) Primary Care [name]: Dellis Filbert Other Clinician: Referring [name]: Treating [name]/Extender: [name] in Treatment: 1 Vital Signs Height(in): 60 Capillary Blood Glucose(mg/dl): 150 Weight(lbs): 165 Pulse(bpm): 29 Body Mass Index(BMI): 32.2 Blood Pressure(mmHg): 184/81 Temperature(F):  98.0 Respiratory Rate(breaths/min): 20 [1:Photos:] Right, Posterior Lower Leg Left Gluteal fold Right, Anterior Lower Leg Wound Location: Blister Gradually Appeared Blister Wounding Event: Lymphedema Abrasion Lymphedema Primary Etiology: Cataracts, Hypertension, Type II Cataracts, Hypertension, Type II Cataracts, Hypertension, Type II Comorbid History: Diabetes, Osteoarthritis Diabetes, Osteoarthritis Diabetes, Osteoarthritis 01/05/2022 02/03/2022 01/07/2022 Date Acquired: '1 1 1 '$ Weeks of Treatment: Open Open Healed - Epithelialized Wound Status: No No No Wound Recurrence: 5x4x0.14 0.5x0.4x0.1 0x0x0 Measurements L x W x D (cm) 15.708 0.157 0 A (cm) : rea 2.199 0.016 0 Volume (cm) : 64.30% 75.00% 100.00% % Reduction in Area: 50.00% 74.60% 100.00% % Reduction in Volume: Full Thickness Without Exposed Full Thickness Without Exposed Full Thickness Without Exposed Classification: Support Structures Support Structures Support Structures Medium Medium None Present Exudate Amount: Serosanguineous Serosanguineous N/A Exudate Type: red, [name] red, [name] N/A Exudate Color: Distinct, outline attached Distinct, outline attached Distinct, outline attached Wound Margin: Small (1-33%) Small (1-33%) None Present (0%) Granulation Amount: Red, Pink Red, Pink N/A Granulation QualityBRINLYN, CENA ([id number]) [id number] Page 4 of 10 Large (67-100%) Large (67-100%) None Present (0%) Necrotic Amount: Eschar, Adherent Slough Eschar, Adherent Slough N/A Necrotic Tissue: Fat Layer (Subcutaneous Tissue): Yes Fat Layer (Subcutaneous Tissue): Yes Fascia: No Exposed Structures: Fascia: No Fascia: No Fat Layer (Subcutaneous Tissue): No Tendon: No Tendon: No Tendon: No Muscle: No Muscle: No Muscle: No Joint: No Joint: No Joint: No Bone: No Bone: No Bone: No None Small (1-33%) Large (67-100%) Epithelialization: Debridement - Excisional Debridement -  Selective/Open Wound N/A Debridement: Pre-procedure Verification/Time Out 15:40 15:40 N/A Taken: Lidocaine 5% topical ointment Lidocaine 5% topical ointment N/A Pain Control: Subcutaneous, Slough Necrotic/Eschar, Slough N/A Tissue Debrided: Skin/Subcutaneous Tissue Non-Viable Tissue N/A Level: 20 0.2 N/A Debridement A (sq cm): rea Curette Curette N/A Instrument: Minimum Minimum N/A Bleeding: Pressure Pressure N/A Hemostasis A chieved: 0 0 N/A Procedural Pain: 0 0 N/A Post Procedural Pain: Procedure was tolerated well Procedure was tolerated well N/A Debridement Treatment Response: 5x4x0.1 0.5x0.4x0.1 N/A Post Debridement Measurements L x W x D (cm) 1.571 0.016 N/A Post Debridement Volume: (cm) No Abnormalities Noted No Abnormalities Noted No Abnormalities Noted Periwound Skin Texture: Dry/Scaly: Yes No Abnormalities Noted Dry/Scaly: Yes Periwound Skin Moisture: Hemosiderin Staining: Yes No Abnormalities Noted Hemosiderin Staining: Yes Periwound Skin Color: No Abnormality No Abnormality No Abnormality Temperature: Compression Therapy Debridement N/A Procedures Performed: Debridement Treatment Notes Electronic Signature(s) Signed: 05/06/2022 4:24:01 PM By: Fredirick Maudlin MD FACS Entered By: Fredirick Maudlin on 05/06/2022 16:24:00 -------------------------------------------------------------------------------- Multi-Disciplinary Care Plan Details Patient Name: Date of Service: [name], [name]. 05/06/2022 2:45 PM Medical Record Number: [id number] Patient Account Number: [id number] Date of Birth/Sex: Treating RN: [birth year] (89 y.o. F)  Dellie Catholic Primary Care Jaycion Treml: Dellis Filbert Other Clinician: Referring Jahmiya Guidotti: Treating Saabir Blyth/Extender: [name] in Treatment: 1 Active Inactive Necrotic Tissue Nursing Diagnoses: Impaired tissue integrity related to necrotic/devitalized tissue Knowledge deficit related to management of  necrotic/devitalized tissue Goals: Necrotic/devitalized tissue will be minimized in the wound bed Date Initiated: 04/29/2022 Target Resolution Date: 06/25/2022 Goal Status: Active Patient/caregiver will verbalize understanding of reason and process for debridement of necrotic tissue Date Initiated: 04/29/2022 Target Resolution Date: 06/25/2022 Goal Status: Active Interventions: Assess patient pain level pre-, during and post procedure and prior to discharge Provide education on necrotic tissue and debridement process Treatment Activities: AZHIA, SIEFKEN ([id number]) [id number] Page 5 of 10 Apply topical anesthetic as ordered : 04/29/2022 Notes: Wound/Skin Impairment Nursing Diagnoses: Impaired tissue integrity Knowledge deficit related to ulceration/compromised skin integrity Goals: Patient/caregiver will verbalize understanding of skin care regimen Date Initiated: 04/29/2022 Target Resolution Date: 06/25/2022 Goal Status: Active Interventions: Assess ulceration(s) every visit Treatment Activities: Skin care regimen initiated : 04/29/2022 Topical wound management initiated : 04/29/2022 Notes: Electronic Signature(s) Signed: 05/06/2022 5:46:07 PM By: Dellie Catholic RN Entered By: Dellie Catholic on 05/06/2022 17:40:06 -------------------------------------------------------------------------------- Pain Assessment Details Patient Name: Date of Service: [name], [name]. 05/06/2022 2:45 PM Medical Record Number: [id number] Patient Account Number: [id number] Date of Birth/Sex: Treating RN: 04-25-1932 (89 y.o. F) Primary Care Kensley Valladares: Dellis Filbert Other Clinician: Referring Adonus Uselman: Treating Jalaysia Lobb/Extender: [name] in Treatment: 1 Active Problems Location of Pain Severity and Description of Pain Patient Has Paino No Site Locations Pain Management and Medication Current Pain Management: Electronic Signature(s) Signed:  05/06/2022 3:52:46 PM By: Worthy Rancher Entered By: Worthy Rancher on 05/06/2022 15:29:15 Minami, Martesha [name] ([id number]) [id number] Page 6 of 10 -------------------------------------------------------------------------------- Patient/Caregiver Education Details Patient Name: Date of Service: [facility], [name] 1/18/2024andnbsp2:45 PM Medical Record Number: [id number] Patient Account Number: [id number] Date of Birth/Gender: Treating RN: [birth year] (89 y.o. [name] Primary Care Physician: Dellis Filbert Other Clinician: Referring Physician: Treating Physician/Extender: [name] in Treatment: 1 Education Assessment Education Provided To: Patient Education Topics Provided Wound/Skin Impairment: Methods: Explain/Verbal Responses: Return demonstration correctly Electronic Signature(s) Signed: 05/06/2022 5:46:07 PM By: Dellie Catholic RN Entered By: Dellie Catholic on 05/06/2022 17:40:18 -------------------------------------------------------------------------------- Wound Assessment Details Patient Name: Date of Service: [name], [name]. 05/06/2022 2:45 PM Medical Record Number: [id number] Patient Account Number: [id number] Date of Birth/Sex: Treating RN: June 02, 1932 (89 y.o. F) Primary Care Deissy Guilbert: Dellis Filbert Other Clinician: Referring Tria Noguera: Treating Robecca Fulgham/Extender: Toy Baker Weeks in Treatment: 1 Wound Status Wound Number: 1 Primary Etiology: Lymphedema Wound Location: Right, Posterior Lower Leg Wound Status: Open Wounding Event: Blister Comorbid History: Cataracts, Hypertension, Type II Diabetes, Osteoarthritis Date Acquired: 01/05/2022 Weeks Of Treatment: 1 Clustered Wound: No Photos Wound Measurements CINTYA, DAUGHETY [name] ([id number]) Length: (cm) 5 Width: (cm) 4 Depth: (cm) 0.14 Area: (cm) 15.708 Volume: (cm) 2.199 [id number] Page 7 of 10 % Reduction in Area:  64.3% % Reduction in Volume: 50% Epithelialization: None Tunneling: No Undermining: No Wound Description Classification: Full Thickness Without Exposed Support Structures Wound Margin: Distinct, outline attached Exudate Amount: Medium Exudate Type: Serosanguineous Exudate Color: red, [name] Foul Odor After Cleansing: No Slough/Fibrino Yes Wound Bed Granulation Amount: Small (1-33%) Exposed Structure Granulation Quality: Red, Pink Fascia Exposed: No Necrotic Amount: Large (67-100%) Fat Layer (Subcutaneous Tissue) Exposed: Yes Necrotic Quality: Eschar, Adherent Slough Tendon Exposed: No Muscle Exposed: No Joint Exposed: No  Bone Exposed: No Periwound Skin Texture Texture Color No Abnormalities Noted: Yes No Abnormalities Noted: No Hemosiderin Staining: Yes Moisture No Abnormalities Noted: No Temperature / Pain Dry / Scaly: Yes Temperature: No Abnormality Treatment Notes Wound #1 (Lower Leg) Wound Laterality: Right, Posterior Cleanser Soap and Water Discharge Instruction: May shower and wash wound with dial antibacterial soap and water prior to dressing change. Wound Cleanser Discharge Instruction: Cleanse the wound with wound cleanser prior to applying a clean dressing using gauze sponges, not tissue or cotton balls. Peri-Wound Care Sween Lotion (Moisturizing lotion) Discharge Instruction: Apply moisturizing lotion as directed Topical Primary Dressing IODOFLEX 0.9% Cadexomer Iodine Pad 4x6 cm Discharge Instruction: Apply to wound bed as instructed Secondary Dressing ABD Pad, 5x9 Discharge Instruction: Apply over primary dressing as directed. Woven Gauze Sponge, Non-Sterile 4x4 in Discharge Instruction: Apply over primary dressing as directed. Secured With Compression Wrap ThreePress (3 layer compression wrap) Discharge Instruction: Apply three layer compression as directed. Compression Stockings Add-Ons Electronic Signature(s) Signed: 05/06/2022 5:46:07 PM By:  Dellie Catholic RN Previous Signature: 05/06/2022 3:52:46 PM Version By: Worthy Rancher Entered By: Dellie Catholic on 05/06/2022 15:54:36 [name], [name] ([id number]) [id number] Page 8 of 10 -------------------------------------------------------------------------------- Wound Assessment Details Patient Name: Date of Service: [name], [name] 05/06/2022 2:45 PM Medical Record Number: [id number] Patient Account Number: [id number] Date of Birth/Sex: Treating RN: [birth year] (89 y.o. F) Primary Care Jamarco Zaldivar: Dellis Filbert Other Clinician: Referring Jonnie Truxillo: Treating Keysi Oelkers/Extender: [name] in Treatment: 1 Wound Status Wound Number: 2 Primary Etiology: Abrasion Wound Location: Left Gluteal fold Wound Status: Open Wounding Event: Gradually Appeared Comorbid History: Cataracts, Hypertension, Type II Diabetes, Osteoarthritis Date Acquired: 02/03/2022 Weeks Of Treatment: 1 Clustered Wound: No Photos Wound Measurements Length: (cm) 0.5 Width: (cm) 0.4 Depth: (cm) 0.1 Area: (cm) 0.157 Volume: (cm) 0.016 % Reduction in Area: 75% % Reduction in Volume: 74.6% Epithelialization: Small (1-33%) Tunneling: No Undermining: No Wound Description Classification: Full Thickness Without Exposed Support Structures Wound Margin: Distinct, outline attached Exudate Amount: Medium Exudate Type: Serosanguineous Exudate Color: red, [name] Foul Odor After Cleansing: No Slough/Fibrino Yes Wound Bed Granulation Amount: Small (1-33%) Exposed Structure Granulation Quality: Red, Pink Fascia Exposed: No Necrotic Amount: Large (67-100%) Fat Layer (Subcutaneous Tissue) Exposed: Yes Necrotic Quality: Eschar, Adherent Slough Tendon Exposed: No Muscle Exposed: No Joint Exposed: No Bone Exposed: No Periwound Skin Texture Texture Color No Abnormalities Noted: Yes No Abnormalities Noted: Yes Moisture Temperature / Pain No Abnormalities Noted:  Yes Temperature: No Abnormality Treatment Notes Wound #2 (Gluteal fold) Wound Laterality: Left Cleanser Soap and Water Discharge Instruction: May shower and wash wound with dial antibacterial soap and water prior to dressing change. [name], [name] ([id number]) [id number] Page 9 of 10 Wound Cleanser Discharge Instruction: Cleanse the wound with wound cleanser prior to applying a clean dressing using gauze sponges, not tissue or cotton balls. Peri-Wound Care Topical Primary Dressing Sorbalgon AG Dressing 2x2 (in/in) Discharge Instruction: Apply to wound bed with Terrasil Secondary Dressing Zetuvit Plus Silicone Border Dressing 4x4 (in/in) Discharge Instruction: Apply silicone border over primary dressing as directed. Secured With Compression Wrap Compression Stockings Environmental education officer) Signed: 05/06/2022 5:46:07 PM By: Dellie Catholic RN Previous Signature: 05/06/2022 3:52:46 PM Version By: Worthy Rancher Entered By: Dellie Catholic on 05/06/2022 15:54:58 -------------------------------------------------------------------------------- Wound Assessment Details Patient Name: Date of Service: [name], [name]. 05/06/2022 2:45 PM Medical Record Number: [id number] Patient Account Number: [id number] Date of Birth/Sex: Treating RN: 1932/07/22 (89 y.o. F) Primary Care Lowell Mcgurk: Willey Blade,  Paula Compton Other Clinician: Referring Jaslin Novitski: Treating Johan Creveling/Extender: Toy Baker Weeks in Treatment: 1 Wound Status Wound Number: 3 Primary Etiology: Lymphedema Wound Location: Right, Anterior Lower Leg Wound Status: Healed - Epithelialized Wounding Event: Blister Comorbid History: Cataracts, Hypertension, Type II Diabetes, Osteoarthritis Date Acquired: 01/07/2022 Weeks Of Treatment: 1 Clustered Wound: No Photos Wound Measurements Length: (cm) Width: (cm) Depth: (cm) Area: (cm) Volume: (cm) 0 % Reduction in Area: 100% 0 % Reduction in  Volume: 100% 0 Epithelialization: Large (67-100%) 0 Tunneling: No 0 Undermining: No Wound Description [name], [name] ([id number]) Classification: Full Thickness Without Exposed Support Structures Wound Margin: Distinct, outline attached Exudate Amount: None Present [id number] Page 10 of 10 Foul Odor After Cleansing: No Slough/Fibrino No Wound Bed Granulation Amount: None Present (0%) Exposed Structure Necrotic Amount: None Present (0%) Fascia Exposed: No Fat Layer (Subcutaneous Tissue) Exposed: No Tendon Exposed: No Muscle Exposed: No Joint Exposed: No Bone Exposed: No Periwound Skin Texture Texture Color No Abnormalities Noted: Yes No Abnormalities Noted: Yes Moisture Temperature / Pain No Abnormalities Noted: Yes Temperature: No Abnormality Electronic Signature(s) Signed: 05/06/2022 5:46:07 PM By: Dellie Catholic RN Previous Signature: 05/06/2022 3:52:46 PM Version By: Worthy Rancher Entered By: Dellie Catholic on 05/06/2022 16:09:44 -------------------------------------------------------------------------------- Vitals Details Patient Name: Date of Service: [name], [name]. 05/06/2022 2:45 PM Medical Record Number: [id number] Patient Account Number: [id number] Date of Birth/Sex: Treating RN: 1932-08-03 (89 y.o. F) Primary Care Aylinn Rydberg: Dellis Filbert Other Clinician: Referring Trashawn Oquendo: Treating Ashmi Blas/Extender: [name] in Treatment: 1 Vital Signs Time Taken: 03:28 Temperature (F): 98.0 Height (in): 60 Pulse (bpm): 81 Weight (lbs): 165 Respiratory Rate (breaths/min): 20 Body Mass Index (BMI): 32.2 Blood Pressure (mmHg): 184/81 Capillary Blood Glucose (mg/dl): 150 Reference Range: 80 - 120 mg / dl Electronic Signature(s) Signed: 05/06/2022 3:52:46 PM By: Worthy Rancher Entered By: Worthy Rancher on 05/06/2022 15:29:07

## 2022-05-07 NOTE — Progress Notes (Signed)
Kimberly, [name] ([id number]) [id number] Page 1 of 10 Visit Report for 05/06/2022 Chief Complaint Document Details Patient Name: Date of Service: [place] [name], [name] 05/06/2022 2:45 PM Medical Record Number: [id number] Patient Account Number: [id number] Date of Birth/Sex: Treating RN: [birth year] (89 y.o. F) Primary Care Provider: Dellis Filbert Other Clinician: Referring Provider: Treating Provider/Extender: [name] in Treatment: 1 Information Obtained from: Patient Chief Complaint Patient presents for treatment of multiple open ulcers due to suspected venous insufficiency and a gluteal pressure ulcer Electronic Signature(s) Signed: 05/06/2022 4:26:11 PM By: Fredirick Maudlin MD FACS Entered By: Fredirick Maudlin on 05/06/2022 16:26:11 -------------------------------------------------------------------------------- Debridement Details Patient Name: Date of Service: [name] [name], [name] 05/06/2022 2:45 PM Medical Record Number: [id number] Patient Account Number: [id number] Date of Birth/Sex: Treating RN: [birth year] (89 y.o. America Brown Primary Care Provider: Dellis Filbert Other Clinician: Referring Provider: Treating Provider/Extender: [name] in Treatment: 1 Debridement Performed for Assessment: Wound #2 Left Gluteal fold Performed By: Physician Fredirick Maudlin, MD Debridement Type: Debridement Level of Consciousness (Pre-procedure): Awake and Alert Pre-procedure Verification/Time Out Yes - 15:40 Taken: Start Time: 15:40 Pain Control: Lidocaine 5% topical ointment T Area Debrided (L x W): otal 0.5 (cm) x 0.4 (cm) = 0.2 (cm) Tissue and other material debrided: Non-Viable, Eschar, Slough, Slough Level: Non-Viable Tissue Debridement Description: Selective/Open Wound Instrument: Curette Bleeding: Minimum Hemostasis Achieved: Pressure End Time: 15:42 Procedural Pain: 0 Post Procedural Pain:  0 Response to Treatment: Procedure was tolerated well Level of Consciousness (Post- Awake and Alert procedure): Post Debridement Measurements of Total Wound Length: (cm) 0.5 Width: (cm) 0.4 Depth: (cm) 0.1 Volume: (cm) 0.016 Character of Wound/Ulcer Post Debridement: Improved Post Procedure Diagnosis [name], [name] ([id number]) [id number] Page 2 of 10 Same as Pre-procedure Notes Scribed for Dr. Celine Ahr by J.Scotton Electronic Signature(s) Signed: 05/06/2022 5:46:07 PM By: Dellie Catholic RN Signed: 05/07/2022 7:36:34 AM By: Fredirick Maudlin MD FACS Entered By: Dellie Catholic on 05/06/2022 23:53:61 -------------------------------------------------------------------------------- Debridement Details Patient Name: Date of Service: [name] [name], [name] 05/06/2022 2:45 PM Medical Record Number: [id number] Patient Account Number: [id number] Date of Birth/Sex: Treating RN: 04/15/1933 (89 y.o. America Brown Primary Care Provider: Dellis Filbert Other Clinician: Referring Provider: Treating Provider/Extender: [name] in Treatment: 1 Debridement Performed for Assessment: Wound #1 Right,Posterior Lower Leg Performed By: Physician Fredirick Maudlin, MD Debridement Type: Debridement Level of Consciousness (Pre-procedure): Awake and Alert Pre-procedure Verification/Time Out Yes - 15:40 Taken: Start Time: 15:40 Pain Control: Lidocaine 5% topical ointment T Area Debrided (L x W): otal 5 (cm) x 4 (cm) = 20 (cm) Tissue and other material debrided: Non-Viable, Slough, Subcutaneous, Slough Level: Skin/Subcutaneous Tissue Debridement Description: Excisional Instrument: Curette Bleeding: Minimum Hemostasis Achieved: Pressure End Time: 15:42 Procedural Pain: 0 Post Procedural Pain: 0 Response to Treatment: Procedure was tolerated well Level of Consciousness (Post- Awake and Alert procedure): Post Debridement Measurements of Total  Wound Length: (cm) 5 Width: (cm) 4 Depth: (cm) 0.1 Volume: (cm) 1.571 Character of Wound/Ulcer Post Debridement: Improved Post Procedure Diagnosis Same as Pre-procedure Notes Scribed for Dr. Celine Ahr by J.Scotton Electronic Signature(s) Signed: 05/06/2022 5:46:07 PM By: Dellie Catholic RN Signed: 05/07/2022 7:36:34 AM By: Fredirick Maudlin MD FACS Entered By: Dellie Catholic on 05/06/2022 16:11:33 [name] ([id number]) [id number] Page 3 of 10 -------------------------------------------------------------------------------- HPI Details Patient Name: Date of Service: [name], [name] 05/06/2022 2:45 PM Medical Record Number: [id number] Patient Account Number: [id number] Date  of Birth/Sex: Treating RN: [birth year] (89 y.o. F) Primary Care Provider: Dellis Filbert Other Clinician: Referring Provider: Treating Provider/Extender: [name] in Treatment: 1 History of Present Illness HPI Description: ADMISSION 04/29/2022 This is an 89 year old type II diabetic (no hemoglobin A1c available for review) who also has hypertension, neuropathy, peripheral angiopathy, chronic kidney disease, among other medical comorbidities. Her history is somewhat difficult to put together as she is extremely hard of hearing and according to her son, who accompanies her today, she does not reliably see physicians. There are apparently some concerns for self-neglect, but the family dynamics are complicated. She has 2+ pitting edema bilaterally with bilateral erythema suggestive of stasis dermatitis. What little outside information was sent ahead indicates that she was thought to potentially have cellulitis and is she is currently taking Keflex for this. She also has open wounds on her right lower extremity on both the anterior, posterior, and medial aspects. They vary in depth but all have slough and nonviable subcutaneous tissue present. She has a  small pressure ulcer on her left gluteus, just adjacent to the natal cleft. 05/06/2022: The anterior lower leg wound is healed. The posterior and medial lower leg wounds have accumulated a substantial amount of slough. Edema control bilaterally is markedly improved from last week. The pressure ulcer on her left buttock is smaller and quite clean with just a small amount of slough and eschar present. Electronic Signature(s) Signed: 05/06/2022 4:27:14 PM By: Fredirick Maudlin MD FACS Entered By: Fredirick Maudlin on 05/06/2022 16:27:14 -------------------------------------------------------------------------------- Physical Exam Details Patient Name: Date of Service: Kimberly [name], [name] 05/06/2022 2:45 PM Medical Record Number: [id number] Patient Account Number: [id number] Date of Birth/Sex: Treating RN: 10-12-1932 (89 y.o. F) Primary Care Provider: Dellis Filbert Other Clinician: Referring Provider: Treating Provider/Extender: Toy Baker Weeks in Treatment: 1 Constitutional Hypertensive, asymptomatic. . . . no acute distress. Respiratory Normal work of breathing on room air. Notes 05/06/2022: The anterior lower leg wound is healed. The posterior and medial lower leg wounds have accumulated a substantial amount of slough. Edema control bilaterally is markedly improved from last week. The pressure ulcer on her left buttock is smaller and quite clean with just a small amount of slough and eschar present. Electronic Signature(s) Signed: 05/06/2022 4:27:50 PM By: Fredirick Maudlin MD FACS Entered By: Fredirick Maudlin on 05/06/2022 16:27:49 -------------------------------------------------------------------------------- Physician Orders Details Patient Name: Date of Service: [name] [name], [name] 05/06/2022 2:45 PM Medical Record Number: [id number] Patient Account Number: [id number] Date of Birth/Sex: Treating RN: [birth year] (89 y.o. [address], [place], [place] B ([id number])  [phone].pdf Page 4 of 10 Primary Care Provider: Dellis Filbert Other Clinician: Referring Provider: Treating Provider/Extender: [name] in Treatment: 1 Verbal / Phone Orders: No Diagnosis Coding ICD-10 Coding Code Description [id number] 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 L89.322 Pressure ulcer of left buttock, stage 2 E11.622 Type 2 diabetes mellitus with other skin ulcer E11.40 Type 2 diabetes mellitus with diabetic neuropathy, unspecified I73.9 Peripheral vascular disease, unspecified H91.93 Unspecified hearing loss, bilateral R60.0 Localized edema Follow-up Appointments ppointment in 1 week. - Dr. Celine Ahr Room 2 Return A Anesthetic (In clinic) Topical Lidocaine 4% applied to wound bed Bathing/ Shower/ Hygiene May shower with protection but do not get wound dressing(s) wet. Protect dressing(s) with water repellant cover (for example, large plastic bag) or  a cast cover and may then take shower. - legs May shower and wash wound with soap and water. - bottom Edema Control - Lymphedema / SCD / Other Elevate legs to the level of the heart or above for 30 minutes daily and/or when sitting for 3-4 times a day throughout the day. A void standing for long periods of time. Moisturize legs daily. If compression wraps slide down please call wound center and speak with a nurse. Non Wound Condition Left Lower Extremity Other Non Wound Condition Orders/Instructions: - 3 layer wrap Home Health Admit to Home Health for skilled nursing wound care. May utilize formulary equivalent dressing for wound treatment orders unless otherwise specified. Dressing changes to be completed by [place] on Monday / Wednesday / Friday except when patient has scheduled visit at [facility]. Wound Treatment Wound #1 - Lower Leg Wound  Laterality: Right, Posterior Cleanser: Soap and Water 1 x Per Week/30 Days Discharge Instructions: May shower and wash wound with dial antibacterial soap and water prior to dressing change. Cleanser: Wound Cleanser 1 x Per Week/30 Days Discharge Instructions: Cleanse the wound with wound cleanser prior to applying a clean dressing using gauze sponges, not tissue or cotton balls. Peri-Wound Care: Sween Lotion (Moisturizing lotion) 1 x Per Week/30 Days Discharge Instructions: Apply moisturizing lotion as directed Prim Dressing: IODOFLEX 0.9% Cadexomer Iodine Pad 4x6 cm 1 x Per Week/30 Days ary Discharge Instructions: Apply to wound bed as instructed Secondary Dressing: ABD Pad, 5x9 1 x Per Week/30 Days Discharge Instructions: Apply over primary dressing as directed. Secondary Dressing: Woven Gauze Sponge, Non-Sterile 4x4 in 1 x Per Week/30 Days Discharge Instructions: Apply over primary dressing as directed. Compression Wrap: ThreePress (3 layer compression wrap) 1 x Per Week/30 Days Discharge Instructions: Apply three layer compression as directed. Wound #2 - Gluteal fold Wound Laterality: Left Cleanser: Soap and Water 1 x Per Day/30 Days Discharge Instructions: May shower and wash wound with dial antibacterial soap and water prior to dressing change. Cleanser: Wound Cleanser 1 x Per Day/30 Days Discharge Instructions: Cleanse the wound with wound cleanser prior to applying a clean dressing using gauze sponges, not tissue or cotton balls. ROANN, MERK ([id number]) [id number] Page 5 of 10 Prim Dressing: Sorbalgon AG Dressing 2x2 (in/in) 1 x Per Day/30 Days ary Discharge Instructions: Apply to wound bed with Terrasil Secondary Dressing: Zetuvit Plus Silicone Border Dressing 4x4 (in/in) 1 x Per Day/30 Days Discharge Instructions: Apply silicone border over primary dressing as directed. Electronic Signature(s) Signed: 05/07/2022 7:36:34 AM By: Fredirick Maudlin MD  FACS Entered By: Fredirick Maudlin on 05/06/2022 16:30:49 -------------------------------------------------------------------------------- Problem List Details Patient Name: Date of Service: [name] [name], [name] 05/06/2022 2:45 PM Medical Record Number: [id number] Patient Account Number: [id number] Date of Birth/Sex: Treating RN: 1932-05-23 (89 y.o. F) Primary Care Provider: Dellis Filbert Other Clinician: Referring Provider: Treating Provider/Extender: [name] in Treatment: 1 Active Problems ICD-10 Encounter Code Description Active Date MDM Diagnosis [phone] Non-pressure chronic ulcer of other part of right lower leg with fat layer 04/29/2022 No Yes exposed L97.212 Non-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 04/29/2022 No Yes L89.322 Pressure ulcer of left buttock, stage 2 04/29/2022 No Yes E11.622 Type 2 diabetes mellitus with other skin ulcer 04/29/2022 No Yes E11.40 Type 2 diabetes mellitus with diabetic neuropathy, unspecified 04/29/2022 No Yes I73.9 Peripheral vascular disease, unspecified 04/29/2022 No Yes H91.93 Unspecified hearing loss, bilateral 04/29/2022 No Yes R60.0 Localized edema 04/29/2022 No Yes  Inactive Problems Resolved Problems Electronic Signature(s) Signed: 05/06/2022 4:23:44 PM By: Fredirick Maudlin MD FACS [address] [place] B ([id number]) PM By: Fredirick Maudlin MD FACS [phone].pdf Page 6 of 10 Signed: 05/06/2022 4:23:44 Entered By: Fredirick Maudlin on 05/06/2022 16:23:44 -------------------------------------------------------------------------------- Progress Note Details Patient Name: Date of Service: [facility], [name] TTIE B. 05/06/2022 2:45 PM Medical Record Number: [id number] Patient Account Number: [id number] Date of Birth/Sex: Treating RN: 03-01-1933 (89 y.o. F) Primary Care Provider: Dellis Filbert Other Clinician: Referring Provider: Treating Provider/Extender: [name]  in Treatment: 1 Subjective Chief Complaint Information obtained from Patient Patient presents for treatment of multiple open ulcers due to suspected venous insufficiency and a gluteal pressure ulcer History of Present Illness (HPI) ADMISSION 04/29/2022 This is an 89 year old type II diabetic (no hemoglobin A1c available for review) who also has hypertension, neuropathy, peripheral angiopathy, chronic kidney disease, among other medical comorbidities. Her history is somewhat difficult to put together as she is extremely hard of hearing and according to her son, who accompanies her today, she does not reliably see physicians. There are apparently some concerns for self-neglect, but the family dynamics are complicated. She has 2+ pitting edema bilaterally with bilateral erythema suggestive of stasis dermatitis. What little outside information was sent ahead indicates that she was thought to potentially have cellulitis and is she is currently taking Keflex for this. She also has open wounds on her right lower extremity on both the anterior, posterior, and medial aspects. They vary in depth but all have slough and nonviable subcutaneous tissue present. She has a small pressure ulcer on her left gluteus, just adjacent to the natal cleft. 05/06/2022: The anterior lower leg wound is healed. The posterior and medial lower leg wounds have accumulated a substantial amount of slough. Edema control bilaterally is markedly improved from last week. The pressure ulcer on her left buttock is smaller and quite clean with just a small amount of slough and eschar present. Patient History Information obtained from Patient. Social History Never smoker, Marital Status - Widowed, Alcohol Use - Never, Drug Use - No History, Caffeine Use - Never. Medical History Eyes Patient has history of Cataracts Cardiovascular Patient has history of Hypertension Endocrine Patient has history of Type II  Diabetes Musculoskeletal Patient has history of Osteoarthritis Hospitalization/Surgery History - cataract extraction. - rectocele repair. - abdominal hysterectomy. - breast surgery. - c-eye surgery procedure. - eye surgery. Medical A Surgical History Notes nd Eyes diabetic retinopathy, macular degeneration Objective Constitutional Hypertensive, asymptomatic. no acute distress. Vitals Time Taken: 3:28 AM, Height: 60 in, Weight: 165 lbs, BMI: 32.2, Temperature: 98.0 F, Pulse: 81 bpm, Respiratory Rate: 20 breaths/min, Blood Pressure: 184/81 mmHg, Capillary Blood Glucose: 150 mg/dl. Respiratory Normal work of breathing on room air. ALEXSIS, KATHMAN ([id number]) [id number] Page 7 of 10 General Notes: 05/06/2022: The anterior lower leg wound is healed. The posterior and medial lower leg wounds have accumulated a substantial amount of slough. Edema control bilaterally is markedly improved from last week. The pressure ulcer on her left buttock is smaller and quite clean with just a small amount of slough and eschar present. Integumentary (Hair, Skin) Wound #1 status is Open. Original cause of wound was Blister. The date acquired was: 01/05/2022. The wound has been in treatment 1 weeks. The wound is located on the Right,Posterior Lower Leg. The wound measures 5cm length x 4cm width x 0.14cm depth; 15.708cm^2 area and 2.199cm^3 volume. There is Fat Layer (Subcutaneous Tissue) exposed. There is no tunneling or undermining  noted. There is a medium amount of serosanguineous drainage noted. The wound margin is distinct with the outline attached to the wound base. There is small (1-33%) red, pink granulation within the wound bed. There is a large (67-100%) amount of necrotic tissue within the wound bed including Eschar and Adherent Slough. The periwound skin appearance had no abnormalities noted for texture. The periwound skin appearance exhibited: Dry/Scaly, Hemosiderin  Staining. Periwound temperature was noted as No Abnormality. Wound #2 status is Open. Original cause of wound was Gradually Appeared. The date acquired was: 02/03/2022. The wound has been in treatment 1 weeks. The wound is located on the Left Gluteal fold. The wound measures 0.5cm length x 0.4cm width x 0.1cm depth; 0.157cm^2 area and 0.016cm^3 volume. There is Fat Layer (Subcutaneous Tissue) exposed. There is no tunneling or undermining noted. There is a medium amount of serosanguineous drainage noted. The wound margin is distinct with the outline attached to the wound base. There is small (1-33%) red, pink granulation within the wound bed. There is a large (67- 100%) amount of necrotic tissue within the wound bed including Eschar and Adherent Slough. The periwound skin appearance had no abnormalities noted for texture. The periwound skin appearance had no abnormalities noted for moisture. The periwound skin appearance had no abnormalities noted for color. Periwound temperature was noted as No Abnormality. Wound #3 status is Healed - Epithelialized. Original cause of wound was Blister. The date acquired was: 01/07/2022. The wound has been in treatment 1 weeks. The wound is located on the Right,Anterior Lower Leg. The wound measures 0cm length x 0cm width x 0cm depth; 0cm^2 area and 0cm^3 volume. There is no tunneling or undermining noted. There is a none present amount of drainage noted. The wound margin is distinct with the outline attached to the wound base. There is no granulation within the wound bed. There is no necrotic tissue within the wound bed. The periwound skin appearance had no abnormalities noted for texture. The periwound skin appearance had no abnormalities noted for moisture. The periwound skin appearance had no abnormalities noted for color. Periwound temperature was noted as No Abnormality. Assessment Active Problems ICD-10 Non-pressure chronic ulcer of other part of right lower  leg with fat layer exposed Non-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 Pressure ulcer of left buttock, stage 2 Type 2 diabetes mellitus with other skin ulcer Type 2 diabetes mellitus with diabetic neuropathy, unspecified Peripheral vascular disease, unspecified Unspecified hearing loss, bilateral Localized edema Procedures Wound #1 Pre-procedure diagnosis of Wound #1 is a Lymphedema located on the Right,Posterior Lower Leg . There was a Excisional Skin/Subcutaneous Tissue Debridement with a total area of 20 sq cm performed by Fredirick Maudlin, MD. With the following instrument(s): Curette to remove Non-Viable tissue/material. Material removed includes Subcutaneous Tissue and Slough and after achieving pain control using Lidocaine 5% topical ointment. No specimens were taken. A time out was conducted at 15:40, prior to the start of the procedure. A Minimum amount of bleeding was controlled with Pressure. The procedure was tolerated well with a pain level of 0 throughout and a pain level of 0 following the procedure. Post Debridement Measurements: 5cm length x 4cm width x 0.1cm depth; 1.571cm^3 volume. Character of Wound/Ulcer Post Debridement is improved. Post procedure Diagnosis Wound #1: Same as Pre-Procedure General Notes: Scribed for Dr. Celine Ahr by J.Scotton. Pre-procedure diagnosis of Wound #1 is a Lymphedema located on the Right,Posterior Lower Leg . There was a Three Layer Compression Therapy Procedure by [employer],  Mechele Claude, RN. Post procedure Diagnosis Wound #1: Same as Pre-Procedure Wound #2 Pre-procedure diagnosis of Wound #2 is an Abrasion located on the Left Gluteal fold . There was a Selective/Open Wound Non-Viable Tissue Debridement with a total area of 0.2 sq cm performed by Fredirick Maudlin, MD. With the following instrument(s): Curette to remove Non-Viable tissue/material. Material removed includes Eschar and Slough and after achieving pain control using  Lidocaine 5% topical ointment. No specimens were taken. A time out was conducted at 15:40, prior to the start of the procedure. A Minimum amount of bleeding was controlled with Pressure. The procedure was tolerated well with a pain level of 0 throughout and a pain level of 0 following the procedure. Post Debridement Measurements: 0.5cm length x 0.4cm width x 0.1cm depth; 0.016cm^3 volume. Character of Wound/Ulcer Post Debridement is improved. Post procedure Diagnosis Wound #2: Same as Pre-Procedure General Notes: Scribed for Dr. Celine Ahr by J.Scotton. There was a Three Layer Compression Therapy Procedure by Dellie Catholic, RN. Post procedure Diagnosis Wound #: Same as Pre-Procedure Plan CAYLYNN, MINCHEW ([id number]) [id number] Page 8 of 10 Follow-up Appointments: Return Appointment in 1 week. - Dr. Celine Ahr Room 2 Anesthetic: (In clinic) Topical Lidocaine 4% applied to wound bed Bathing/ Shower/ Hygiene: May shower with protection but do not get wound dressing(s) wet. Protect dressing(s) with water repellant cover (for example, large plastic bag) or a cast cover and may then take shower. - legs May shower and wash wound with soap and water. - bottom Edema Control - Lymphedema / SCD / Other: Elevate legs to the level of the heart or above for 30 minutes daily and/or when sitting for 3-4 times a day throughout the day. Avoid standing for long periods of time. Moisturize legs daily. If compression wraps slide down please call wound center and speak with a nurse. Non Wound Condition: Other Non Wound Condition Orders/Instructions: - 3 layer wrap Home Health: Admit to Home Health for skilled nursing wound care. May utilize formulary equivalent dressing for wound treatment orders unless otherwise specified. Dressing changes to be completed by [place] on Monday / Wednesday / Friday except when patient has scheduled visit at [facility]. WOUND #1: - Lower Leg  Wound Laterality: Right, Posterior Cleanser: Soap and Water 1 x Per Week/30 Days Discharge Instructions: May shower and wash wound with dial antibacterial soap and water prior to dressing change. Cleanser: Wound Cleanser 1 x Per Week/30 Days Discharge Instructions: Cleanse the wound with wound cleanser prior to applying a clean dressing using gauze sponges, not tissue or cotton balls. Peri-Wound Care: Sween Lotion (Moisturizing lotion) 1 x Per Week/30 Days Discharge Instructions: Apply moisturizing lotion as directed Prim Dressing: IODOFLEX 0.9% Cadexomer Iodine Pad 4x6 cm 1 x Per Week/30 Days ary Discharge Instructions: Apply to wound bed as instructed Secondary Dressing: ABD Pad, 5x9 1 x Per Week/30 Days Discharge Instructions: Apply over primary dressing as directed. Secondary Dressing: Woven Gauze Sponge, Non-Sterile 4x4 in 1 x Per Week/30 Days Discharge Instructions: Apply over primary dressing as directed. Com pression Wrap: ThreePress (3 layer compression wrap) 1 x Per Week/30 Days Discharge Instructions: Apply three layer compression as directed. WOUND #2: - Gluteal fold Wound Laterality: Left Cleanser: Soap and Water 1 x Per Day/30 Days Discharge Instructions: May shower and wash wound with dial antibacterial soap and water prior to dressing change. Cleanser: Wound Cleanser 1 x Per Day/30 Days Discharge Instructions: Cleanse the wound with wound cleanser prior to applying a clean dressing using gauze sponges, not  tissue or cotton balls. Prim Dressing: Sorbalgon AG Dressing 2x2 (in/in) 1 x Per Day/30 Days ary Discharge Instructions: Apply to wound bed with T errasil Secondary Dressing: Zetuvit Plus Silicone Border Dressing 4x4 (in/in) 1 x Per Day/30 Days Discharge Instructions: Apply silicone border over primary dressing as directed. 05/06/2022: The anterior lower leg wound is healed. The posterior and medial lower leg wounds have accumulated a substantial amount of slough.  Edema control bilaterally is markedly improved from last week. The pressure ulcer on her left buttock is smaller and quite clean with just a small amount of slough and eschar present. I used a curette to debride slough and eschar from the gluteal ulcer and slough and nonviable subcutaneous tissue from the posterior and medial lower leg wounds. The leg wounds wounds would benefit from further chemical debridement so we will apply Iodoflex under 3 layer compression. We are wrapping the patient's left leg as well, due to her edema and a component of self-neglect, to try and avoid new wounds from forming on this leg. We will continue silver alginate to the gluteal ulcer. Follow-up in 1 week. Electronic Signature(s) Signed: 05/06/2022 4:40:09 PM By: Fredirick Maudlin MD FACS Entered By: Fredirick Maudlin on 05/06/2022 16:40:09 -------------------------------------------------------------------------------- HxROS Details Patient Name: Date of Service: Kimberly [name], [name] 05/06/2022 2:45 PM Medical Record Number: [id number] Patient Account Number: [id number] Date of Birth/Sex: Treating RN: 10-13-1932 (89 y.o. F) Primary Care Provider: Dellis Filbert Other Clinician: Referring Provider: Treating Provider/Extender: [name] in Treatment: 1 Information Obtained From Patient Eyes Medical History: FRADEL, BALDONADO ([id number]) [id number] Page 9 of 10 Positive for: Cataracts Past Medical History Notes: diabetic retinopathy, macular degeneration Cardiovascular Medical History: Positive for: Hypertension Endocrine Medical History: Positive for: Type II Diabetes Time with diabetes: 36 yrs Treated with: Insulin Blood sugar tested every day: Yes Tested : Musculoskeletal Medical History: Positive for: Osteoarthritis HBO Extended History Items Eyes: Cataracts Immunizations Pneumococcal Vaccine: Received Pneumococcal Vaccination: No Implantable  Devices None Hospitalization / Surgery History Type of Hospitalization/Surgery cataract extraction rectocele repair abdominal hysterectomy breast surgery c-eye surgery procedure eye surgery Family and Social History Never smoker; Marital Status - Widowed; Alcohol Use: Never; Drug Use: No History; Caffeine Use: Never; Financial Concerns: No; Food, Clothing or Shelter Needs: No; Support System Lacking: No; Transportation Concerns: No Electronic Signature(s) Signed: 05/07/2022 7:36:34 AM By: Fredirick Maudlin MD FACS Entered By: Fredirick Maudlin on 05/06/2022 16:27:22 -------------------------------------------------------------------------------- SuperBill Details Patient Name: Date of Service: [name] [name], [place]. 05/06/2022 Medical Record Number: [id number] Patient Account Number: [id number] Date of Birth/Sex: Treating RN: August 08, 1932 (89 y.o. F) Primary Care Provider: Dellis Filbert Other Clinician: Referring Provider: Treating Provider/Extender: [name] in Treatment: 1 Diagnosis Coding ICD-10 Codes Code Description [id number] Non-pressure chronic 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 LUCCIA, REINHEIMER B ([id number]) [id number] Page 10 of 10 [phone] Non-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 L89.322 Pressure ulcer of left buttock, stage 2 E11.622 Type 2 diabetes mellitus with other skin ulcer E11.40 Type 2 diabetes mellitus with diabetic neuropathy, unspecified I73.9 Peripheral vascular disease, unspecified H91.93 Unspecified hearing loss, bilateral R60.0 Localized edema Facility Procedures : CPT4 Code: [id number] Description: 09323 - DEB SUBQ TISSUE 20 SQ CM/< ICD-10 Diagnosis Description F57.322 Non-pressure chronic ulcer of other part of right lower leg with fat layer expos L97.212 Non-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 Modifier: ed Quantity: 1 : CPT4  Code:  [id number] Description: 02111 - DEBRIDE WOUND 1ST 20 SQ CM OR < ICD-10 Diagnosis Description L89.322 Pressure ulcer of left buttock, stage 2 Modifier: Quantity: 1 Physician Procedures : CPT4 Code Description Modifier [id number] - WC PHYS LEVEL 3 - EST PT 25 ICD-10 Diagnosis Description Q24.497 Non-pressure chronic 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 L97.212 Non-pressure chronic ulcer of right calf with fat  layer exposed L89.322 Pressure ulcer of left buttock, stage 2 R60.0 Localized edema Quantity: 1 : [id number] 02111 - WC PHYS SUBQ TISS 20 SQ CM ICD-10 Diagnosis Description L97.812 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 Quantity: 1 : [id number] 41030 - WC PHYS DEBR WO ANESTH 20 SQ CM ICD-10 Diagnosis Description L89.322 Pressure ulcer of left buttock, stage 2 Quantity: 1 Electronic Signature(s) Signed: 05/06/2022 4:40:58 PM By: Fredirick Maudlin MD FACS Entered By: Fredirick Maudlin on 05/06/2022 16:40:58

## 2022-05-14 ENCOUNTER — Encounter (HOSPITAL_BASED_OUTPATIENT_CLINIC_OR_DEPARTMENT_OTHER): Payer: Medicare Other | Admitting: General Surgery

## 2022-05-14 DIAGNOSIS — I129 Hypertensive chronic kidney disease with stage 1 through stage 4 chronic kidney disease, or unspecified chronic kidney disease: Secondary | ICD-10-CM | POA: Diagnosis not present

## 2022-05-14 DIAGNOSIS — E11622 Type 2 diabetes mellitus with other skin ulcer: Secondary | ICD-10-CM | POA: Diagnosis not present

## 2022-05-14 DIAGNOSIS — L89322 Pressure ulcer of left buttock, stage 2: Secondary | ICD-10-CM | POA: Diagnosis not present

## 2022-05-14 DIAGNOSIS — N189 Chronic kidney disease, unspecified: Secondary | ICD-10-CM | POA: Diagnosis not present

## 2022-05-14 DIAGNOSIS — E1122 Type 2 diabetes mellitus with diabetic chronic kidney disease: Secondary | ICD-10-CM | POA: Diagnosis not present

## 2022-05-14 DIAGNOSIS — L97212 Non-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: Secondary | ICD-10-CM | POA: Diagnosis not present

## 2022-05-14 DIAGNOSIS — I890 Lymphedema, not elsewhere classified: Secondary | ICD-10-CM | POA: Diagnosis not present

## 2022-05-14 DIAGNOSIS — L97812 Non-pressure chronic 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: Secondary | ICD-10-CM | POA: Diagnosis not present

## 2022-05-14 DIAGNOSIS — E1140 Type 2 diabetes mellitus with diabetic neuropathy, unspecified: Secondary | ICD-10-CM | POA: Diagnosis not present

## 2022-05-14 DIAGNOSIS — E1151 Type 2 diabetes mellitus with diabetic peripheral angiopathy without gangrene: Secondary | ICD-10-CM | POA: Diagnosis not present

## 2022-05-14 NOTE — Progress Notes (Signed)
Kimberly, [name] ([id number]) [id number] Page 1 of 9 Visit Report for 05/14/2022 Chief Complaint Document Details Patient Name: Date of Service: [facility] [name], [name]. 05/14/2022 1:30 PM Medical Record Number: [id number] Patient Account Number: [id number] Date of Birth/Sex: Treating RN: 08/05/1932 (89 y.o. F) Primary Care Provider: Dellis Filbert Other Clinician: Referring Provider: Treating Provider/Extender: [name] in Treatment: 2 Information Obtained from: Patient Chief Complaint Patient presents for treatment of multiple open ulcers due to suspected venous insufficiency and a gluteal pressure ulcer Electronic Signature(s) Signed: 05/14/2022 2:33:06 PM By: Fredirick Maudlin MD FACS Entered By: Fredirick Maudlin on 05/14/2022 14:33:06 -------------------------------------------------------------------------------- Debridement Details Patient Name: Date of Service: [name], [name]. 05/14/2022 1:30 PM Medical Record Number: [id number] Patient Account Number: [id number] Date of Birth/Sex: Treating RN: 08-08-1932 (89 y.o. America Brown Primary Care Provider: Dellis Filbert Other Clinician: Referring Provider: Treating Provider/Extender: [name] in Treatment: 2 Debridement Performed for Assessment: Wound #1 Right,Posterior Lower Leg Performed By: Physician Fredirick Maudlin, MD Debridement Type: Debridement Level of Consciousness (Pre-procedure): Awake and Alert Pre-procedure Verification/Time Out Yes - 14:00 Taken: Start Time: 14:00 Pain Control: Lidocaine 5% topical ointment T Area Debrided (L x W): otal 7.5 (cm) x 4.2 (cm) = 31.5 (cm) Tissue and other material debrided: Non-Viable, Slough, Slough Level: Non-Viable Tissue Debridement Description: Selective/Open Wound Instrument: Curette Bleeding: Minimum Hemostasis Achieved: Pressure End Time: 14:02 Procedural Pain: 0 Post Procedural Pain:  0 Response to Treatment: Procedure was tolerated well Level of Consciousness (Post- Awake and Alert procedure): Post Debridement Measurements of Total Wound Length: (cm) 7.5 Width: (cm) 4.2 Depth: (cm) 0.1 Volume: (cm) 2.474 Character of Wound/Ulcer Post Debridement: Improved Post Procedure Diagnosis [name], [name] ([id number]) [id number] Page 2 of 9 Same as Pre-procedure Notes Scribed for Dr. Celine Ahr by J.Scotton Electronic Signature(s) Signed: 05/14/2022 3:31:20 PM By: Fredirick Maudlin MD FACS Signed: 05/14/2022 4:47:02 PM By: Dellie Catholic RN Entered By: Dellie Catholic on 05/14/2022 14:07:44 -------------------------------------------------------------------------------- HPI Details Patient Name: Date of Service: [name], [name]. 05/14/2022 1:30 PM Medical Record Number: [id number] Patient Account Number: [id number] Date of Birth/Sex: Treating RN: 1933/02/24 (89 y.o. F) Primary Care Provider: Dellis Filbert Other Clinician: Referring Provider: Treating Provider/Extender: [name] in Treatment: 2 History of Present Illness HPI Description: ADMISSION 04/29/2022 This is an 89 year old type II diabetic (no hemoglobin A1c available for review) who also has hypertension, neuropathy, peripheral angiopathy, chronic kidney disease, among other medical comorbidities. Her history is somewhat difficult to put together as she is extremely hard of hearing and according to her son, who accompanies her today, she does not reliably see physicians. There are apparently some concerns for self-neglect, but the family dynamics are complicated. She has 2+ pitting edema bilaterally with bilateral erythema suggestive of stasis dermatitis. What little outside information was sent ahead indicates that she was thought to potentially have cellulitis and is she is currently taking Keflex for this. She also has open wounds on her right lower  extremity on both the anterior, posterior, and medial aspects. They vary in depth but all have slough and nonviable subcutaneous tissue present. She has a small pressure ulcer on her left gluteus, just adjacent to the natal cleft. 05/06/2022: The anterior lower leg wound is healed. The posterior and medial lower leg wounds have accumulated a substantial amount of slough. Edema control bilaterally is markedly improved from last week. The pressure ulcer on her left buttock is smaller  and quite clean with just a small amount of slough and eschar present. 05/14/2022: The gluteal ulcer is healed. The ulcers on her posterior and medial lower leg are cleaner but still with a fair amount of slough buildup. They are less tender than on prior visits. Edema control bilaterally is good. Electronic Signature(s) Signed: 05/14/2022 2:33:51 PM By: Fredirick Maudlin MD FACS Entered By: Fredirick Maudlin on 05/14/2022 14:33:51 -------------------------------------------------------------------------------- Physical Exam Details Patient Name: Date of Service: [name], [name]. 05/14/2022 1:30 PM Medical Record Number: [id number] Patient Account Number: [id number] Date of Birth/Sex: Treating RN: Oct 31, 1932 (89 y.o. F) Primary Care Provider: Dellis Filbert Other Clinician: Referring Provider: Treating Provider/Extender: Toy Baker Weeks in Treatment: 2 Constitutional Slightly hypertensive. . . . no acute distress. Respiratory Normal work of breathing on room air. Notes [name], [name] ([id number]) [id number] Page 3 of 9 05/14/2022: The gluteal ulcer is healed. The ulcers on her posterior and medial lower leg are cleaner but still with a fair amount of slough buildup. They are less tender than on prior visits. Edema control bilaterally is good. Electronic Signature(s) Signed: 05/14/2022 2:34:43 PM By: Fredirick Maudlin MD FACS Entered By: Fredirick Maudlin on 05/14/2022  14:34:43 -------------------------------------------------------------------------------- Physician Orders Details Patient Name: Date of Service: [name], [name]. 05/14/2022 1:30 PM Medical Record Number: [id number] Patient Account Number: [id number] Date of Birth/Sex: Treating RN: 28-Feb-1933 (89 y.o. 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 - Dr. Celine Ahr Room 2 Return A Anesthetic (In clinic) Topical Lidocaine 4% applied to wound bed Bathing/ Shower/ Hygiene May shower with protection but do not get wound dressing(s) wet. Protect dressing(s) with water repellant cover (for example, large plastic bag) or a cast cover and may then take shower. - legs May shower and wash wound with soap and water. - bottom Edema Control - Lymphedema / SCD / Other Elevate legs to the level of the heart or above for 30 minutes daily and/or when sitting for 3-4 times a day throughout the day. A void standing for long periods of time. Moisturize legs daily. If compression wraps slide down please call wound center and speak with a nurse. Non Wound Condition Left Lower Extremity Other Non Wound Condition Orders/Instructions: - 3 layer wrap (Left Leg) Home Health Admit to Home Health for skilled nursing wound care.  May utilize formulary equivalent dressing for wound treatment orders unless otherwise specified. Dressing changes to be completed by [facility] on Monday / Wednesday / Friday except when patient has scheduled visit at [facility] Wound Treatment Wound #1 - Lower Leg Wound Laterality: Right, Posterior Cleanser: Soap and Water 1 x Per Week/30 Days Discharge Instructions: May shower and wash wound with dial antibacterial soap and water prior to dressing change. Cleanser: Wound Cleanser 1 x Per Week/30 Days Discharge Instructions: Cleanse the wound with wound cleanser prior to applying a clean dressing using gauze sponges, not tissue  or cotton balls. Peri-Wound Care: Sween Lotion (Moisturizing lotion) 1 x Per Week/30 Days Discharge Instructions: Apply moisturizing lotion as directed [name], [name] ([id number]) [phone].pdf Page 4 of 9 Prim Dressing: IODOFLEX 0.9% Cadexomer Iodine Pad 4x6 cm 1 x Per Week/30 Days ary Discharge Instructions: Apply to wound bed as instructed Secondary Dressing: ABD Pad, 5x9 1 x Per Week/30 Days Discharge Instructions: Apply over primary dressing as directed. Secondary Dressing: Woven Gauze Sponge, Non-Sterile 4x4 in 1 x Per Week/30 Days Discharge Instructions: Apply over primary dressing as directed. Compression Wrap: ThreePress (3 layer compression wrap) 1 x Per Week/30 Days Discharge Instructions: Apply three layer compression as directed. Electronic Signature(s) Signed: 05/14/2022 3:31:20 PM By: Fredirick Maudlin MD FACS Entered By: Fredirick Maudlin on 05/14/2022 14:34:56 -------------------------------------------------------------------------------- Problem List Details Patient Name: Date of Service: [name], [name]. 05/14/2022 1:30 PM Medical Record Number: [id number] Patient Account Number: [id number] Date of Birth/Sex: Treating RN: [birth year] (89 y.o. F) Primary Care Provider: Dellis Filbert Other Clinician: Referring  Provider: Treating Provider/Extender: [name] in Treatment: 2 Active Problems ICD-10 Encounter Code Description Active Date MDM Diagnosis [phone] Non-pressure chronic ulcer of other part of right lower leg with fat layer 04/29/2022 No Yes exposed L97.212 Non-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 04/29/2022 No Yes L89.322 Pressure ulcer of left buttock, stage 2 04/29/2022 No Yes E11.622 Type 2 diabetes mellitus with other skin ulcer 04/29/2022 No Yes E11.40 Type 2 diabetes mellitus with diabetic neuropathy, unspecified 04/29/2022 No Yes I73.9 Peripheral vascular disease, unspecified 04/29/2022 No Yes H91.93 Unspecified hearing loss, bilateral 04/29/2022 No Yes R60.0 Localized edema 04/29/2022 No Yes Inactive Problems [name], [name] ([id number]) [phone].pdf Page 5 of 9 Resolved Problems Electronic Signature(s) Signed: 05/14/2022 2:28:55 PM By: Fredirick Maudlin MD FACS Entered By: Fredirick Maudlin on 05/14/2022 14:28:55 -------------------------------------------------------------------------------- Progress Note Details Patient Name: Date of Service: [name], [name]. 05/14/2022 1:30 PM Medical Record Number: [id number] Patient Account Number: [id number] Date of Birth/Sex: Treating RN: [birth year] (89 y.o. F) Primary Care Provider: Dellis Filbert Other Clinician: Referring Provider: Treating Provider/Extender: [name] in Treatment: 2 Subjective Chief Complaint Information obtained from Patient Patient presents for treatment of multiple open ulcers due to suspected venous insufficiency and a gluteal pressure ulcer History of Present Illness (HPI) ADMISSION 04/29/2022 This is an 89 year old type II diabetic (no hemoglobin A1c available for review) who also has hypertension, neuropathy, peripheral angiopathy, chronic kidney disease, among other medical comorbidities. Her history is  somewhat difficult to put together as she is extremely hard of hearing and according to her son, who accompanies her today, she does not reliably see physicians. There are apparently some concerns for self-neglect, but the family dynamics are complicated. She has 2+ pitting edema bilaterally with bilateral erythema suggestive of stasis dermatitis. What little outside information was sent ahead indicates that she was thought to potentially have cellulitis and is she is currently taking Keflex for this. She also has open wounds on her right lower extremity on both the anterior, posterior, and medial aspects. They vary in depth but all have slough and nonviable subcutaneous tissue present. She has a small pressure ulcer on her left gluteus, just adjacent to the natal cleft. 05/06/2022: The anterior lower leg wound is healed. The posterior and medial lower leg wounds have accumulated a substantial amount of slough. Edema control bilaterally is markedly improved from last week. The pressure ulcer on her left buttock is smaller and  quite clean with just a small amount of slough and eschar present. 05/14/2022: The gluteal ulcer is healed. The ulcers on her posterior and medial lower leg are cleaner but still with a fair amount of slough buildup. They are less tender than on prior visits. Edema control bilaterally is good. Patient History Information obtained from Patient. Social History Never smoker, Marital Status - Widowed, Alcohol Use - Never, Drug Use - No History, Caffeine Use - Never. Medical History Eyes Patient has history of Cataracts Cardiovascular Patient has history of Hypertension Endocrine Patient has history of Type II Diabetes Musculoskeletal Patient has history of Osteoarthritis Hospitalization/Surgery History - cataract extraction. - rectocele repair. - abdominal hysterectomy. - breast surgery. - c-eye surgery procedure. - eye surgery. Medical A Surgical History  Notes nd Eyes diabetic retinopathy, macular degeneration Objective [name], [name] ([id number]) [id number] Page 6 of 9 Constitutional Slightly hypertensive. no acute distress. Vitals Time Taken: 2:45 PM, Height: 60 in, Weight: 165 lbs, BMI: 32.2, Temperature: 98.4 F, Pulse: 80 bpm, Respiratory Rate: 18 breaths/min, Blood Pressure: 149/74 mmHg. Respiratory Normal work of breathing on room air. General Notes: 05/14/2022: The gluteal ulcer is healed. The ulcers on her posterior and medial lower leg are cleaner but still with a fair amount of slough buildup. They are less tender than on prior visits. Edema control bilaterally is good. Integumentary (Hair, Skin) Wound #1 status is Open. Original cause of wound was Blister. The date acquired was: 01/05/2022. The wound has been in treatment 2 weeks. The wound is located on the Right,Posterior Lower Leg. The wound measures 7.5cm length x 4.2cm width x 0.1cm depth; 24.74cm^2 area and 2.474cm^3 volume. There is Fat Layer (Subcutaneous Tissue) exposed. There is no tunneling or undermining noted. There is a medium amount of serosanguineous drainage noted. The wound margin is distinct with the outline attached to the wound base. There is medium (34-66%) red, pink granulation within the wound bed. There is a medium (34-66%) amount of necrotic tissue within the wound bed including Eschar and Adherent Slough. The periwound skin appearance had no abnormalities noted for texture. The periwound skin appearance exhibited: Dry/Scaly, Hemosiderin Staining. Periwound temperature was noted as No Abnormality. Wound #2 status is Healed - Epithelialized. Original cause of wound was Gradually Appeared. The date acquired was: 02/03/2022. The wound has been in treatment 2 weeks. The wound is located on the Left Gluteal fold. The wound measures 0cm length x 0cm width x 0cm depth; 0cm^2 area and 0cm^3 volume. There is no tunneling or undermining  noted. There is a none present amount of drainage noted. The wound margin is distinct with the outline attached to the wound base. There is no granulation within the wound bed. There is no necrotic tissue within the wound bed. The periwound skin appearance had no abnormalities noted for texture. The periwound skin appearance had no abnormalities noted for moisture. The periwound skin appearance had no abnormalities noted for color. Periwound temperature was noted as No Abnormality. 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 There was a Selective/Open Wound Non-Viable Tissue Debridement with a total area  of 31.5 sq cm performed by Fredirick Maudlin, MD. With the following instrument(s): Curette to remove Non-Viable tissue/material. Material removed includes [facility] after achieving pain control using Lidocaine 5% topical ointment. No specimens were taken. A time out was conducted at 14:00, prior to the start of the procedure. A Minimum amount of bleeding was controlled with Pressure. The procedure was tolerated well with a pain level of 0 throughout and a pain level of 0 following the procedure. Post Debridement Measurements: 7.5cm length x 4.2cm width x 0.1cm depth; 2.474cm^3 volume. Character of Wound/Ulcer Post Debridement is improved. Post procedure Diagnosis Wound #1: Same as Pre-Procedure General Notes: Scribed for Dr. Celine Ahr by J.Scotton. Pre-procedure diagnosis of Wound #1 is a Lymphedema located on  the Right,Posterior Lower Leg . There was a Three Layer Compression Therapy Procedure by Dellie Catholic, RN. Post procedure Diagnosis Wound #1: Same as Pre-Procedure There was a Three Layer Compression Therapy Procedure by Dellie Catholic, RN. Post procedure Diagnosis Wound #: Same as Pre-Procedure Plan Follow-up Appointments: Return Appointment in 1 week. - Dr. Celine Ahr Room 2 Anesthetic: (In clinic) Topical Lidocaine 4% applied to wound bed Bathing/ Shower/ Hygiene: May shower with protection but do not get wound dressing(s) wet. Protect dressing(s) with water repellant cover (for example, large plastic bag) or a cast cover and may then take shower. - legs May shower and wash wound with soap and water. - bottom Edema Control - Lymphedema / SCD / OtherTRENAE, [name] ([id number]) [id number] Page 7 of 9 Elevate legs to the level of the heart or above for 30 minutes daily and/or when sitting for 3-4 times a day throughout the day. Avoid standing for long periods of time. Moisturize legs daily. If compression wraps slide down please call wound center and speak with a nurse. Non Wound Condition: Other Non Wound Condition Orders/Instructions: - 3 layer wrap (Left Leg) Home Health: Admit to Home Health for skilled nursing wound care. May utilize formulary equivalent dressing for wound treatment orders unless otherwise specified. Dressing changes to be completed by [place] on Monday / Wednesday / Friday except when patient has scheduled visit at [facility]. WOUND #1: - Lower Leg Wound Laterality: Right, Posterior Cleanser: Soap and Water 1 x Per Week/30 Days Discharge Instructions: May shower and wash wound with dial antibacterial soap and water prior to dressing change. Cleanser: Wound Cleanser 1 x Per Week/30 Days Discharge Instructions: Cleanse the wound with wound cleanser prior to applying a clean dressing using gauze sponges, not tissue or cotton  balls. Peri-Wound Care: Sween Lotion (Moisturizing lotion) 1 x Per Week/30 Days Discharge Instructions: Apply moisturizing lotion as directed Prim Dressing: IODOFLEX 0.9% Cadexomer Iodine Pad 4x6 cm 1 x Per Week/30 Days ary Discharge Instructions: Apply to wound bed as instructed Secondary Dressing: ABD Pad, 5x9 1 x Per Week/30 Days Discharge Instructions: Apply over primary dressing as directed. Secondary Dressing: Woven Gauze Sponge, Non-Sterile 4x4 in 1 x Per Week/30 Days Discharge Instructions: Apply over primary dressing as directed. Com pression Wrap: ThreePress (3 layer compression wrap) 1 x Per Week/30 Days Discharge Instructions: Apply three layer compression as directed. 05/14/2022: The gluteal ulcer is healed. The ulcers on her posterior and medial lower leg are cleaner but still with a fair amount of slough buildup. They are less tender than on prior visits. Edema control bilaterally is good. I used a curette to debride the slough off of her leg wounds. I think these will continue to benefit from further chemical debridement so we  will continue to use Iodoflex. We will continue bilateral 3 layer compression wraps to manage her edema. I have recommended that she continue to have some padding on her buttock area and avoid sitting for prolonged periods. Continue bilateral leg elevation. Follow-up in 1 week. Electronic Signature(s) Signed: 05/14/2022 2:35:49 PM By: Fredirick Maudlin MD FACS Entered By: Fredirick Maudlin on 05/14/2022 14:35:49 -------------------------------------------------------------------------------- HxROS Details Patient Name: Date of Service: [name], [name]. 05/14/2022 1:30 PM Medical Record Number: [id number] Patient Account Number: [id number] Date of Birth/Sex: Treating RN: Jul 28, 1932 (89 y.o. F) Primary Care Provider: Dellis Filbert Other Clinician: Referring Provider: Treating Provider/Extender: [name] in Treatment:  2 Information Obtained From Patient Eyes Medical History: Positive for: Cataracts Past Medical History Notes: diabetic retinopathy, macular degeneration Cardiovascular Medical History: Positive for: Hypertension Endocrine Medical History: Positive for: Type II Diabetes Time with diabetes: 37 yrs Treated with: Insulin Blood sugar tested every day: Yes Tested [name], [name] ([id number]) [id number] Page 8 of 9 Musculoskeletal Medical History: Positive for: Osteoarthritis HBO Extended History Items Eyes: Cataracts Immunizations Pneumococcal Vaccine: Received Pneumococcal Vaccination: No Implantable Devices None Hospitalization / Surgery History Type of Hospitalization/Surgery cataract extraction rectocele repair abdominal hysterectomy breast surgery c-eye surgery procedure eye surgery Family and Social History Never smoker; Marital Status - Widowed; Alcohol Use: Never; Drug Use: No History; Caffeine Use: Never; Financial Concerns: No; Food, Clothing or Shelter Needs: No; Support System Lacking: No; Transportation Concerns: No Electronic Signature(s) Signed: 05/14/2022 3:31:20 PM By: Fredirick Maudlin MD FACS Entered By: Fredirick Maudlin on 05/14/2022 14:34:21 -------------------------------------------------------------------------------- SuperBill Details Patient Name: Date of Service: [name], [name]. 05/14/2022 Medical Record Number: [id number] Patient Account Number: [id number] Date of Birth/Sex: Treating RN: 1932-12-17 (89 y.o. F) Primary Care Provider: Dellis Filbert Other Clinician: Referring Provider: Treating Provider/Extender: [name] in Treatment: 2 Diagnosis Coding ICD-10 Codes Code Description [phone] 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 L89.322 Pressure ulcer of left buttock,  stage 2 E11.622 Type 2 diabetes mellitus with other skin ulcer E11.40 Type 2 diabetes mellitus with diabetic neuropathy, unspecified I73.9 Peripheral vascular disease, unspecified H91.93 Unspecified hearing loss, bilateral R60.0 Localized edema Facility Procedures : [place], [name] CPT4 Code: [id number] [name] ([id number]) [id number] IC L L Description: 97597 - DEBRIDE WOUND 1ST 20 SQ CM OR < ICD-10 Diagnosis Description L97.812 Non-pressure chronic ulcer of other part of right lower leg with fat layer expos L97.212 Non-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  [id number] - DEBRIDE WOUND EA ADDL 20 SQ CM D-10 Diagnosis Description 97.812 Non-pressure chronic 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 97.212 Non-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 Modifier: ed _[id number] 1 Quantity: 1 27.pdf Page 9 of 9 Physician Procedures : CPT4 Code Description Modifier [id number] - WC PHYS LEVEL 3 - EST PT 25 ICD-10 Diagnosis Description 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 R60.0 Localized edema E11.622 Type 2 diabetes mellitus with other skin ulcer Quantity: 1 : [id number] - WC PHYS DEBR WO ANESTH 20 SQ CM ICD-10 Diagnosis Description L97.812 Non-pressure chronic 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 L97.212 Non-pressure chronic ulcer of right  calf with fat layer exposed Quantity: 1 : [id number] - WC PHYS DEBR WO ANESTH EA ADD 20 CM ICD-10 Diagnosis Description Y33.383 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

## 2022-05-14 NOTE — Progress Notes (Signed)
KENIA, TEAGARDEN ([id number]) [id number] Page 1 of 9 Visit Report for 05/14/2022 Arrival Information Details Patient Name: Date of Service: [name], [name] [name] 05/14/2022 1:30 PM Medical Record Number: [id number] Patient Account Number: [id number] Date of Birth/Sex: Treating RN: [birth year] (89 y.o. America Brown Primary Care Amelia Burgard: Dellis Filbert Other Clinician: Referring Jerni Selmer: Treating Whitni Pasquini/Extender: [name] in Treatment: 2 Visit Information History Since Last Visit Added or deleted any medications: No Patient Arrived: Ambulatory Any new allergies or adverse reactions: No Arrival Time: 13:45 Had a fall or experienced change in No Accompanied By: [place] grandson activities of daily living that may affect Transfer Assistance: Manual risk of falls: Patient Identification Verified: Yes Signs or symptoms of abuse/neglect since last visito No Hospitalized since last visit: No Implantable device outside of the clinic excluding No cellular tissue based products placed in the center since last visit: Has Dressing in Place as Prescribed: Yes Pain Present Now: No Electronic Signature(s) Signed: 05/14/2022 4:47:02 PM By: Dellie Catholic RN Entered By: Dellie Catholic on 05/14/2022 13:46:55 -------------------------------------------------------------------------------- Compression Therapy Details Patient Name: Date of Service: [name] 05/14/2022 1:30 PM Medical Record Number: [id number] Patient Account Number: [id number] Date of Birth/Sex: Treating RN: November 13, 1932 (89 y.o. America Brown Primary Care Alexis Mizuno: Dellis Filbert Other Clinician: Referring Brittny Spangle: Treating Dalyah Pla/Extender: [name] in Treatment: 2 Compression Therapy Performed for Wound Assessment: NonWound Condition Lymphedema - Left Leg Performed By: Clinician Dellie Catholic, RN Compression Type: Three Layer Post  Procedure Diagnosis Same as Pre-procedure Electronic Signature(s) Signed: 05/14/2022 4:47:02 PM By: Dellie Catholic RN Entered By: Dellie Catholic on 05/14/2022 14:08:01 [address] [place] B ([id number]) [id number] Page 2 of 9 -------------------------------------------------------------------------------- Compression Therapy Details Patient Name: Date of Service: [name], [name] TTIE B. 05/14/2022 1:30 PM Medical Record Number: [id number] Patient Account Number: [id number] Date of Birth/Sex: Treating RN: 1933-03-24 (89 y.o. America Brown Primary Care Bobbi Yount: Dellis Filbert Other Clinician: Referring Anniston Nellums: Treating Bea Duren/Extender: [name] in Treatment: 2 Compression Therapy Performed for Wound Assessment: Wound #1 Right,Posterior Lower Leg Performed By: Clinician Dellie Catholic, RN Compression Type: Three Layer Post Procedure Diagnosis Same as Pre-procedure Electronic Signature(s) Signed: 05/14/2022 4:47:02 PM By: Dellie Catholic RN Entered By: Dellie Catholic on 05/14/2022 14:08:20 -------------------------------------------------------------------------------- Encounter Discharge Information Details Patient Name: Date of Service: [name] 05/14/2022 1:30 PM Medical Record Number: [id number] Patient Account Number: [id number] Date of Birth/Sex: Treating RN: 1932-11-09 (89 y.o. America Brown Primary Care Sherly Brodbeck: Dellis Filbert Other Clinician: Referring Domnic Vantol: Treating Sakeena Teall/Extender: [name] in Treatment: 2 Encounter Discharge Information Items Post Procedure Vitals Discharge Condition: Stable Temperature (F): 98.4 Ambulatory Status: Wheelchair Pulse (bpm): 80 Discharge Destination: Home Respiratory Rate (breaths/min): 18 Transportation: Private Auto Blood Pressure (mmHg): 149/74 Accompanied By: great grandson Schedule Follow-up Appointment: Yes Clinical Summary of  Care: Patient Declined Electronic Signature(s) Signed: 05/14/2022 4:47:02 PM By: Dellie Catholic RN Entered By: Dellie Catholic on 05/14/2022 16:44:45 -------------------------------------------------------------------------------- Lower Extremity Assessment Details Patient Name: Date of Service: [name] 05/14/2022 1:30 PM Medical Record Number: [id number] Patient Account Number: [id number] Date of Birth/Sex: Treating RN: December 17, 1932 (89 y.o. America Brown Primary Care Walter Min: Dellis Filbert Other Clinician: Referring Duan Scharnhorst: Treating Jahmya Onofrio/Extender: Toy Baker Weeks in Treatment: 2 Edema Assessment Assessed: [Left: No] Patrice Paradise: No] [Left: Edema] Patrice Paradise: :] Bobbe Medico, Livier B ([id number]) [id number] Page 3 of 9 Left:  Right: Point of Measurement: From Medial Instep 38 cm 37.6 cm Ankle Left: Right: Point of Measurement: From Medial Instep 22.5 cm 23 cm Vascular Assessment Pulses: Dorsalis Pedis Palpable: [Left:Yes] [Right:Yes] Electronic Signature(s) Signed: 05/14/2022 4:47:02 PM By: Dellie Catholic RN Entered By: Dellie Catholic on 05/14/2022 13:47:47 -------------------------------------------------------------------------------- Multi Wound Chart Details Patient Name: Date of Service: [name] 05/14/2022 1:30 PM Medical Record Number: [id number] Patient Account Number: [id number] Date of Birth/Sex: Treating RN: 10-Jan-1933 (89 y.o. F) Primary Care Zakhai Meisinger: Dellis Filbert Other Clinician: Referring Brynden Thune: Treating Lacrystal Barbe/Extender: [name] in Treatment: 2 Vital Signs Height(in): 60 Pulse(bpm): 80 Weight(lbs): 165 Blood Pressure(mmHg): 149/74 Body Mass Index(BMI): 32.2 Temperature(F): 98.4 Respiratory Rate(breaths/min): 18 [1:Photos:] [N/A:N/A] Right, Posterior Lower Leg Left Gluteal fold N/A Wound Location: Blister Gradually Appeared N/A Wounding  Event: Lymphedema Abrasion N/A Primary Etiology: Cataracts, Hypertension, Type II Cataracts, Hypertension, Type II N/A Comorbid History: Diabetes, Osteoarthritis Diabetes, Osteoarthritis 01/05/2022 02/03/2022 N/A Date Acquired: 2 2 N/A Weeks of Treatment: Open Healed - Epithelialized N/A Wound Status: No No N/A Wound Recurrence: 7.5x4.2x0.1 0x0x0 N/A Measurements L x W x D (cm) 24.74 0 N/A A (cm) : rea 2.474 0 N/A Volume (cm) : 43.70% 100.00% N/A % Reduction in Area: 43.70% 100.00% N/A % Reduction in Volume: Full Thickness Without Exposed Full Thickness Without Exposed N/A Classification: Support Structures Support Structures Medium None Present N/A Exudate Amount: Serosanguineous N/A N/A Exudate Type: red, brown N/A N/A Exudate Color: Distinct, outline attached Distinct, outline attached N/A Wound Margin: Medium (34-66%) None Present (0%) N/A Granulation Amount: Red, Pink N/A N/A Granulation Quality: Medium (34-66%) None Present (0%) N/A Necrotic Amount: EVIANNA, CHANDRAN ([id number]) [id number] Page 4 of 9 Eschar, Adherent Slough N/A N/A Necrotic Tissue: Fat Layer (Subcutaneous Tissue): Yes Fascia: No N/A Exposed Structures: Fascia: No Fat Layer (Subcutaneous Tissue): No Tendon: No Tendon: No Muscle: No Muscle: No Joint: No Joint: No Bone: No Bone: No None Small (1-33%) N/A Epithelialization: Debridement - Selective/Open Wound N/A N/A Debridement: Pre-procedure Verification/Time Out 14:00 N/A N/A Taken: Lidocaine 5% topical ointment N/A N/A Pain Control: Slough N/A N/A Tissue Debrided: Non-Viable Tissue N/A N/A Level: 31.5 N/A N/A Debridement A (sq cm): rea Curette N/A N/A Instrument: Minimum N/A N/A Bleeding: Pressure N/A N/A Hemostasis A chieved: 0 N/A N/A Procedural Pain: 0 N/A N/A Post Procedural Pain: Procedure was tolerated well N/A N/A Debridement Treatment Response: 7.5x4.2x0.1 N/A N/A Post  Debridement Measurements L x W x D (cm) 2.474 N/A N/A Post Debridement Volume: (cm) No Abnormalities Noted No Abnormalities Noted N/A Periwound Skin Texture: Dry/Scaly: Yes No Abnormalities Noted N/A Periwound Skin Moisture: Hemosiderin Staining: Yes No Abnormalities Noted N/A Periwound Skin Color: No Abnormality No Abnormality N/A Temperature: Compression Therapy N/A N/A Procedures Performed: Debridement Treatment Notes Electronic Signature(s) Signed: 05/14/2022 2:29:12 PM By: Fredirick Maudlin MD FACS Entered By: Fredirick Maudlin on 05/14/2022 14:29:12 -------------------------------------------------------------------------------- Multi-Disciplinary Care Plan Details Patient Name: Date of Service: [name] 05/14/2022 1:30 PM Medical Record Number: [id number] Patient Account Number: [id number] Date of Birth/Sex: Treating RN: 05-Mar-1933 (89 y.o. America Brown Primary Care Dezarae Mcclaran: Dellis Filbert Other Clinician: Referring Joseantonio Dittmar: Treating Jalan Fariss/Extender: [name] in Treatment: 2 Active Inactive Necrotic Tissue Nursing Diagnoses: Impaired tissue integrity related to necrotic/devitalized tissue Knowledge deficit related to management of necrotic/devitalized tissue Goals: Necrotic/devitalized tissue will be minimized in the wound bed Date Initiated: 04/29/2022 Target Resolution Date: 06/25/2022 Goal Status: Active Patient/caregiver will verbalize understanding of  reason and process for debridement of necrotic tissue Date Initiated: 04/29/2022 Target Resolution Date: 06/25/2022 Goal Status: Active Interventions: Assess patient pain level pre-, during and post procedure and prior to discharge Provide education on necrotic tissue and debridement process Treatment Activities: Apply topical anesthetic as ordered : 04/29/2022 ONITA, PFLUGER ([id number]) [phone].pdf Page 5 of 9 Notes: Wound/Skin  Impairment Nursing Diagnoses: Impaired tissue integrity Knowledge deficit related to ulceration/compromised skin integrity Goals: Patient/caregiver will verbalize understanding of skin care regimen Date Initiated: 04/29/2022 Target Resolution Date: 06/25/2022 Goal Status: Active Interventions: Assess ulceration(s) every visit Treatment Activities: Skin care regimen initiated : 04/29/2022 Topical wound management initiated : 04/29/2022 Notes: Electronic Signature(s) Signed: 05/14/2022 4:47:02 PM By: Dellie Catholic RN Entered By: Dellie Catholic on 05/14/2022 16:42:35 -------------------------------------------------------------------------------- Pain Assessment Details Patient Name: Date of Service: [name] 05/14/2022 1:30 PM Medical Record Number: [id number] Patient Account Number: [id number] Date of Birth/Sex: Treating RN: 1932-05-27 (89 y.o. America Brown Primary Care Reis Pienta: Dellis Filbert Other Clinician: Referring Jahmani Staup: Treating Rush Salce/Extender: [name] in Treatment: 2 Active Problems Location of Pain Severity and Description of Pain Patient Has Paino No Site Locations Pain Management and Medication Current Pain Management: Electronic Signature(s) Signed: 05/14/2022 4:47:02 PM By: Dellie Catholic RN Entered By: Dellie Catholic on 05/14/2022 13:47:38 Nimmons, Majesti B ([id number]) [id number] Page 6 of 9 -------------------------------------------------------------------------------- Patient/Caregiver Education Details Patient Name: Date of Service: [facility], [name] [name] 1/26/2024andnbsp1:30 PM Medical Record Number: [id number] Patient Account Number: [id number] Date of Birth/Gender: Treating RN: Aug 27, 1932 (89 y.o. America Brown Primary Care Physician: Dellis Filbert Other Clinician: Referring Physician: Treating Physician/Extender: [name] in Treatment: 2 Education  Assessment Education Provided To: Patient Education Topics Provided Wound/Skin Impairment: Methods: Explain/Verbal Responses: Return demonstration correctly Electronic Signature(s) Signed: 05/14/2022 4:47:02 PM By: Dellie Catholic RN Entered By: Dellie Catholic on 05/14/2022 16:42:50 -------------------------------------------------------------------------------- Wound Assessment Details Patient Name: Date of Service: [name] 05/14/2022 1:30 PM Medical Record Number: [id number] Patient Account Number: [id number] Date of Birth/Sex: Treating RN: 20-Feb-1933 (89 y.o. America Brown Primary Care Kortny Lirette: Dellis Filbert Other Clinician: Referring Shameer Molstad: Treating Jamin Humphries/Extender: Toy Baker Weeks in Treatment: 2 Wound Status Wound Number: 1 Primary Etiology: Lymphedema Wound Location: Right, Posterior Lower Leg Wound Status: Open Wounding Event: Blister Comorbid History: Cataracts, Hypertension, Type II Diabetes, Osteoarthritis Date Acquired: 01/05/2022 Weeks Of Treatment: 2 Clustered Wound: No Photos Wound Measurements Length: (cm) 7. KIM, OKI B ([id number]) Width: (cm) Depth: (cm) Area: (cm) Volume: (cm) 5 % Reduction in Area: 43.7% [id number].pdf Page 7 of 9 4.2 % Reduction in Volume: 43.7% 0.1 Epithelialization: None 24.74 Tunneling: No 2.474 Undermining: No Wound Description Classification: Full Thickness Without Exposed Support Structures Wound Margin: Distinct, outline attached Exudate Amount: Medium Exudate Type: Serosanguineous Exudate Color: red, brown Foul Odor After Cleansing: No Slough/Fibrino Yes Wound Bed Granulation Amount: Medium (34-66%) Exposed Structure Granulation Quality: Red, Pink Fascia Exposed: No Necrotic Amount: Medium (34-66%) Fat Layer (Subcutaneous Tissue) Exposed: Yes Necrotic Quality: Eschar, Adherent Slough Tendon Exposed: No Muscle Exposed: No Joint Exposed:  No Bone Exposed: No Periwound Skin Texture Texture Color No Abnormalities Noted: Yes No Abnormalities Noted: No Hemosiderin Staining: Yes Moisture No Abnormalities Noted: No Temperature / Pain Dry / Scaly: Yes Temperature: No Abnormality Treatment Notes Wound #1 (Lower Leg) Wound Laterality: Right, Posterior Cleanser Soap and Water Discharge Instruction: May shower and wash wound with dial antibacterial soap and water prior to  dressing change. Wound Cleanser Discharge Instruction: Cleanse the wound with wound cleanser prior to applying a clean dressing using gauze sponges, not tissue or cotton balls. Peri-Wound Care Sween Lotion (Moisturizing lotion) Discharge Instruction: Apply moisturizing lotion as directed Topical Primary Dressing IODOFLEX 0.9% Cadexomer Iodine Pad 4x6 cm Discharge Instruction: Apply to wound bed as instructed Secondary Dressing ABD Pad, 5x9 Discharge Instruction: Apply over primary dressing as directed. Woven Gauze Sponge, Non-Sterile 4x4 in Discharge Instruction: Apply over primary dressing as directed. Secured With Compression Wrap ThreePress (3 layer compression wrap) Discharge Instruction: Apply three layer compression as directed. Compression Stockings Add-Ons Electronic Signature(s) Signed: 05/14/2022 4:47:02 PM By: Dellie Catholic RN Entered By: Dellie Catholic on 05/14/2022 13:55:03 [address], [place] B ([id number]) [id number] Page 8 of 9 -------------------------------------------------------------------------------- Wound Assessment Details Patient Name: Date of Service: [place] [name], [name] TTIE B. 05/14/2022 1:30 PM Medical Record Number: [id number] Patient Account Number: [id number] Date of Birth/Sex: Treating RN: 1932-12-24 (89 y.o. America Brown Primary Care Menna Abeln: Dellis Filbert Other Clinician: Referring Keoshia Steinmetz: Treating Vardaan Depascale/Extender: [name] in Treatment: 2 Wound Status Wound  Number: 2 Primary Etiology: Abrasion Wound Location: Left Gluteal fold Wound Status: Healed - Epithelialized Wounding Event: Gradually Appeared Comorbid History: Cataracts, Hypertension, Type II Diabetes, Osteoarthritis Date Acquired: 02/03/2022 Weeks Of Treatment: 2 Clustered Wound: No Photos Wound Measurements Length: (cm) Width: (cm) Depth: (cm) Area: (cm) Volume: (cm) 0 % Reduction in Area: 100% 0 % Reduction in Volume: 100% 0 Epithelialization: Small (1-33%) 0 Tunneling: No 0 Undermining: No Wound Description Classification: Full Thickness Without Exposed Support Wound Margin: Distinct, outline attached Exudate Amount: None Present Structures Foul Odor After Cleansing: No Slough/Fibrino No Wound Bed Granulation Amount: None Present (0%) Exposed Structure Necrotic Amount: None Present (0%) Fascia Exposed: No Fat Layer (Subcutaneous Tissue) Exposed: No Tendon Exposed: No Muscle Exposed: No Joint Exposed: No Bone Exposed: No Periwound Skin Texture Texture Color No Abnormalities Noted: Yes No Abnormalities Noted: Yes Moisture Temperature / Pain No Abnormalities Noted: Yes Temperature: No Abnormality Electronic Signature(s) Signed: 05/14/2022 4:47:02 PM By: Dellie Catholic RN Entered By: Dellie Catholic on 05/14/2022 14:05:05 Emanuelson, Michiko B ([id number]) [id number] Page 9 of 9 -------------------------------------------------------------------------------- Vitals Details Patient Name: Date of Service: [name], [name] TTIE B. 05/14/2022 1:30 PM Medical Record Number: [id number] Patient Account Number: [id number] Date of Birth/Sex: Treating RN: [birth year] (89 y.o. America Brown Primary Care Akshitha Culmer: Dellis Filbert Other Clinician: Referring Tyresse Jayson: Treating Colbert Curenton/Extender: [name] in Treatment: 2 Vital Signs Time Taken: 14:45 Temperature (F): 98.4 Height (in): 60 Pulse (bpm): 80 Weight (lbs):  165 Respiratory Rate (breaths/min): 18 Body Mass Index (BMI): 32.2 Blood Pressure (mmHg): 149/74 Reference Range: 80 - 120 mg / dl Electronic Signature(s) Signed: 05/14/2022 4:47:02 PM By: Dellie Catholic RN Entered By: Dellie Catholic on 05/14/2022 13:47:31

## 2022-05-24 ENCOUNTER — Encounter (HOSPITAL_BASED_OUTPATIENT_CLINIC_OR_DEPARTMENT_OTHER): Payer: Medicare Other | Attending: General Surgery | Admitting: General Surgery

## 2022-05-24 DIAGNOSIS — E11319 Type 2 diabetes mellitus with unspecified diabetic retinopathy without macular edema: Secondary | ICD-10-CM | POA: Diagnosis not present

## 2022-05-24 DIAGNOSIS — L97212 Non-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: Secondary | ICD-10-CM | POA: Diagnosis not present

## 2022-05-24 DIAGNOSIS — I872 Venous insufficiency (chronic) (peripheral): Secondary | ICD-10-CM | POA: Diagnosis not present

## 2022-05-24 DIAGNOSIS — E11622 Type 2 diabetes mellitus with other skin ulcer: Secondary | ICD-10-CM | POA: Insufficient documentation

## 2022-05-24 DIAGNOSIS — L97812 Non-pressure chronic 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: Secondary | ICD-10-CM | POA: Diagnosis not present

## 2022-05-24 DIAGNOSIS — E1151 Type 2 diabetes mellitus with diabetic peripheral angiopathy without gangrene: Secondary | ICD-10-CM | POA: Insufficient documentation

## 2022-05-24 DIAGNOSIS — I890 Lymphedema, not elsewhere classified: Secondary | ICD-10-CM | POA: Diagnosis not present

## 2022-05-24 DIAGNOSIS — I10 Essential (primary) hypertension: Secondary | ICD-10-CM | POA: Insufficient documentation

## 2022-05-24 DIAGNOSIS — E1140 Type 2 diabetes mellitus with diabetic neuropathy, unspecified: Secondary | ICD-10-CM | POA: Insufficient documentation

## 2022-05-25 NOTE — Progress Notes (Signed)
Kimberly, [name] ([id number]) [id number] Page 1 of 9 Visit Report for 05/24/2022 Chief Complaint Document Details Patient Name: Date of Service: [name], [name] [name] 05/24/2022 3:45 PM Medical Record Number: [id number] Patient Account Number: [id number] Date of Birth/Sex: Treating RN: 02/10/1933 (89 y.o. F) Primary Care Provider: Dellis Filbert Other Clinician: Referring Provider: Treating Provider/Extender: [name] in Treatment: 3 Information Obtained from: Patient Chief Complaint Patient presents for treatment of multiple open ulcers due to suspected venous insufficiency and a gluteal pressure ulcer Electronic Signature(s) Signed: 05/24/2022 4:24:20 PM By: Fredirick Maudlin MD FACS Entered By: Fredirick Maudlin on 05/24/2022 16:24:20 -------------------------------------------------------------------------------- Debridement Details Patient Name: Date of Service: Kimberly [name], [name] 05/24/2022 3:45 PM Medical Record Number: [id number] Patient Account Number: [id number] Date of Birth/Sex: Treating RN: 1932-05-19 (89 y.o. America Brown Primary Care Provider: Dellis Filbert Other Clinician: Referring Provider: Treating Provider/Extender: [name] in Treatment: 3 Debridement Performed for Assessment: Wound #1 Right,Posterior Lower Leg Performed By: Physician Fredirick Maudlin, MD Debridement Type: Debridement Level of Consciousness (Pre-procedure): Awake and Alert Pre-procedure Verification/Time Out Yes - 16:05 Taken: Start Time: 16:05 Pain Control: Lidocaine 5% topical ointment T Area Debrided (L x W): otal 7.2 (cm) x 4.5 (cm) = 32.4 (cm) Tissue and other material debrided: Non-Viable, Eschar, Slough, Subcutaneous, Slough Level: Skin/Subcutaneous Tissue Debridement Description: Excisional Instrument: Curette Bleeding: Minimum Hemostasis Achieved: Pressure End Time: 16:07 Procedural Pain: 0 Post  Procedural Pain: 0 Response to Treatment: Procedure was tolerated well Level of Consciousness (Post- Awake and Alert procedure): Post Debridement Measurements of Total Wound Length: (cm) 7.2 Width: (cm) 4.5 Depth: (cm) 0.1 Volume: (cm) 2.545 Character of Wound/Ulcer Post Debridement: Improved Post Procedure Diagnosis [name], [name] ([id number]) [id number] Page 2 of 9 Same as Pre-procedure Notes Scribed for Dr. Celine Ahr by J.Scotton Electronic Signature(s) Signed: 05/24/2022 4:28:07 PM By: Fredirick Maudlin MD FACS Signed: 05/24/2022 5:39:05 PM By: Dellie Catholic RN Entered By: Dellie Catholic on 05/24/2022 16:17:33 -------------------------------------------------------------------------------- HPI Details Patient Name: Date of Service: Kimberly [name], [name] 05/24/2022 3:45 PM Medical Record Number: [id number] Patient Account Number: [id number] Date of Birth/Sex: Treating RN: [birth year] (89 y.o. F) Primary Care Provider: Dellis Filbert Other Clinician: Referring Provider: Treating Provider/Extender: [name] in Treatment: 3 History of Present Illness HPI Description: ADMISSION 04/29/2022 This is an 89 year old type II diabetic (no hemoglobin A1c available for review) who also has hypertension, neuropathy, peripheral angiopathy, chronic kidney disease, among other medical comorbidities. Her history is somewhat difficult to put together as she is extremely hard of hearing and according to her son, who accompanies her today, she does not reliably see physicians. There are apparently some concerns for self-neglect, but the family dynamics are complicated. She has 2+ pitting edema bilaterally with bilateral erythema suggestive of stasis dermatitis. What little outside information was sent ahead indicates that she was thought to potentially have cellulitis and is she is currently taking Keflex for this. She also has open wounds on her right  lower extremity on both the anterior, posterior, and medial aspects. They vary in depth but all have slough and nonviable subcutaneous tissue present. She has a small pressure ulcer on her left gluteus, just adjacent to the natal cleft. 05/06/2022: The anterior lower leg wound is healed. The posterior and medial lower leg wounds have accumulated a substantial amount of slough. Edema control bilaterally is markedly improved from last week. The pressure ulcer on her left buttock is  smaller and quite clean with just a small amount of slough and eschar present. 05/14/2022: The gluteal ulcer is healed. The ulcers on her posterior and medial lower leg are cleaner but still with a fair amount of slough buildup. They are less tender than on prior visits. Edema control bilaterally is good. 05/24/2022: The ulcers on her right lower leg are smaller and cleaner again today. Significantly less slough accumulation. Edema control is good. Electronic Signature(s) Signed: 05/24/2022 4:24:54 PM By: Fredirick Maudlin MD FACS Entered By: Fredirick Maudlin on 05/24/2022 16:24:53 -------------------------------------------------------------------------------- Physical Exam Details Patient Name: Date of Service: Kimberly [name], [name] 05/24/2022 3:45 PM Medical Record Number: [id number] Patient Account Number: [id number] Date of Birth/Sex: Treating RN: [birth year] (89 y.o. F) Primary Care Provider: Dellis Filbert Other Clinician: Referring Provider: Treating Provider/Extender: [name] in Treatment: 3 Constitutional Hypertensive, asymptomatic. . . . no acute distress. Respiratory Normal work of breathing on room air. [name], [name] ([id number]) [id number] Page 3 of 9 Notes 05/24/2022: The ulcers on her right lower leg are smaller and cleaner again today. Significantly less slough accumulation. Edema control is good. Electronic Signature(s) Signed: 05/24/2022 4:25:22 PM By:  Fredirick Maudlin MD FACS Entered By: Fredirick Maudlin on 05/24/2022 16:25:22 -------------------------------------------------------------------------------- Physician Orders Details Patient Name: Date of Service: Kimberly [name], [name] 05/24/2022 3:45 PM Medical Record Number: [id number] Patient Account Number: [id number] Date of Birth/Sex: Treating RN: May 05, 1932 (89 y.o. 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 - Dr. Celine Ahr Room 2 Return A Anesthetic (In clinic) Topical Lidocaine 4% applied to wound bed Bathing/ Shower/ Hygiene May shower with protection but do not get wound dressing(s) wet. Protect dressing(s) with water repellant cover (for example, large plastic bag) or a cast cover and may then take shower. - legs May shower and wash wound with soap and water. - bottom Edema Control - Lymphedema / SCD / Other Elevate legs to the level of the heart or above for 30 minutes daily and/or when sitting for 3-4 times a day throughout the day. A void standing for long periods of time. Moisturize legs daily. If compression wraps slide down please call wound center and speak with a nurse. Non Wound Condition Left Lower Extremity Other Non Wound Condition Orders/Instructions: - 3 layer wrap (Left Leg) Home Health Admit to Home Health for skilled  nursing wound care. May utilize formulary equivalent dressing for wound treatment orders unless otherwise specified. Dressing changes to be completed by [facility] on Monday / Wednesday / Friday except when patient has scheduled visit at [facility]. Wound Treatment Wound #1 - Lower Leg Wound Laterality: Right, Posterior Cleanser: Soap and Water 1 x Per Week/30 Days Discharge Instructions: May shower and wash wound with dial antibacterial soap and water prior to dressing change. Cleanser: Wound Cleanser 1 x Per Week/30 Days Discharge Instructions: Cleanse the wound with wound cleanser prior to applying a clean dressing using gauze sponges, not tissue or cotton  balls. Peri-Wound Care: Sween Lotion (Moisturizing lotion) 1 x Per Week/30 Days Discharge Instructions: Apply moisturizing lotion as directed Prim Dressing: Sorbalgon AG Dressing 6x6 (in/in) ary 1 x Per Week/30 Days KINLEIGH, NAULT ([id number]) [id number] Page 4 of 9 Discharge Instructions: Apply to wound bed as instructed Secondary Dressing: ABD Pad, 5x9 1 x Per Week/30 Days Discharge Instructions: Apply over primary dressing as directed. Secondary Dressing: Woven Gauze Sponge, Non-Sterile 4x4 in 1 x Per Week/30 Days Discharge Instructions: Apply over primary dressing as directed. Compression Wrap: ThreePress (3 layer compression wrap) 1 x Per Week/30 Days Discharge Instructions: Apply three layer compression as directed. Electronic Signature(s) Signed: 05/24/2022 4:28:07 PM By: Fredirick Maudlin MD FACS Entered By: Fredirick Maudlin on 05/24/2022 16:26:10 -------------------------------------------------------------------------------- Problem List Details Patient Name: Date of Service: Letitia Neri, [name] 05/24/2022 3:45 PM Medical Record Number: [id number] Patient Account Number: [id number] Date of Birth/Sex: Treating RN: 22-Oct-1932 (89 y.o. F) Primary Care Provider: Dellis Filbert Other  Clinician: Referring Provider: Treating Provider/Extender: [name] in Treatment: 3 Active Problems ICD-10 Encounter Code Description Active Date MDM Diagnosis [phone] Non-pressure chronic ulcer of other part of right lower leg with fat layer 04/29/2022 No Yes exposed L97.212 Non-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 04/29/2022 No Yes E11.622 Type 2 diabetes mellitus with other skin ulcer 04/29/2022 No Yes E11.40 Type 2 diabetes mellitus with diabetic neuropathy, unspecified 04/29/2022 No Yes I73.9 Peripheral vascular disease, unspecified 04/29/2022 No Yes H91.93 Unspecified hearing loss, bilateral 04/29/2022 No Yes R60.0 Localized edema 04/29/2022 No Yes Inactive Problems ICD-10 Code Description Active Date Inactive Date L89.322 Pressure ulcer of left buttock, stage 2 04/29/2022 04/29/2022 COLISHA, REDLER ([id number]) [id number] Page 5 of 9 Resolved Problems Electronic Signature(s) Signed: 05/24/2022 4:20:23 PM By: Fredirick Maudlin MD FACS Entered By: Fredirick Maudlin on 05/24/2022 16:20:22 -------------------------------------------------------------------------------- Progress Note Details Patient Name: Date of Service: Kimberly [name], [name] 05/24/2022 3:45 PM Medical Record Number: [id number] Patient Account Number: [id number] Date of Birth/Sex: Treating RN: Apr 15, 1933 (89 y.o. F) Primary Care Provider: Dellis Filbert Other Clinician: Referring Provider: Treating Provider/Extender: [name] in Treatment: 3 Subjective Chief Complaint Information obtained from Patient Patient presents for treatment of multiple open ulcers due to suspected venous insufficiency and a gluteal pressure ulcer History of Present Illness (HPI) ADMISSION 04/29/2022 This is an 89 year old type II diabetic (no hemoglobin A1c available for review) who also has hypertension, neuropathy, peripheral angiopathy,  chronic kidney disease, among other medical comorbidities. Her history is somewhat difficult to put together as she is extremely hard of hearing and according to her son, who accompanies her today, she does not reliably see physicians. There are apparently some concerns for self-neglect, but the family dynamics are complicated. She has 2+ pitting edema bilaterally with bilateral erythema suggestive of stasis dermatitis. What little outside information was sent ahead indicates that she was thought to potentially have cellulitis and is she is currently taking Keflex for this. She also has open wounds on her right lower extremity on both the anterior, posterior, and medial aspects. They vary in depth but all have slough and nonviable subcutaneous tissue present. She has a small pressure ulcer on her left gluteus, just adjacent to the natal cleft. 05/06/2022: The anterior lower leg wound is healed. The posterior and medial lower leg wounds have accumulated a substantial amount of slough. Edema control bilaterally is markedly improved from last week. The pressure ulcer on her left buttock is  smaller and quite clean with just a small amount of slough and eschar present. 05/14/2022: The gluteal ulcer is healed. The ulcers on her posterior and medial lower leg are cleaner but still with a fair amount of slough buildup. They are less tender than on prior visits. Edema control bilaterally is good. 05/24/2022: The ulcers on her right lower leg are smaller and cleaner again today. Significantly less slough accumulation. Edema control is good. Patient History Information obtained from Patient. Social History Never smoker, Marital Status - Widowed, Alcohol Use - Never, Drug Use - No History, Caffeine Use - Never. Medical History Eyes Patient has history of Cataracts Cardiovascular Patient has history of Hypertension Endocrine Patient has history of Type II Diabetes Musculoskeletal Patient has history of  Osteoarthritis Hospitalization/Surgery History - cataract extraction. - rectocele repair. - abdominal hysterectomy. - breast surgery. - c-eye surgery procedure. - eye surgery. Medical A Surgical History Notes nd Eyes diabetic retinopathy, macular degeneration SALIMATOU, SIMONE ([id number]) [id number] Page 6 of 9 Objective Constitutional Hypertensive, asymptomatic. no acute distress. Vitals Time Taken: 3:56 PM, Height: 60 in, Weight: 165 lbs, BMI: 32.2, Temperature: 97.9 F, Pulse: 79 bpm, Respiratory Rate: 18 breaths/min, Blood Pressure: 184/76 mmHg. Respiratory Normal work of breathing on room air. General Notes: 05/24/2022: The ulcers on her right lower leg are smaller and cleaner again today. Significantly less slough accumulation. Edema control is good. Integumentary (Hair, Skin) Wound #1 status is Open. Original cause of wound was Blister. The date acquired was: 01/05/2022. The wound has been in treatment 3 weeks. The wound is located on the Right,Posterior Lower Leg. The wound measures 7.2cm length x 4.5cm width x 0.1cm depth; 25.447cm^2 area and 2.545cm^3 volume. There is Fat Layer (Subcutaneous Tissue) exposed. There is no tunneling or undermining noted. There is a medium amount of serosanguineous drainage noted. The wound margin is distinct with the outline attached to the wound base. There is medium (34-66%) red, pink granulation within the wound bed. There is a medium (34-66%) amount of necrotic tissue within the wound bed including Eschar and Adherent Slough. The periwound skin appearance had no abnormalities noted for texture. The periwound skin appearance exhibited: Dry/Scaly, Hemosiderin Staining. Periwound temperature was noted as No Abnormality. 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 There was a Excisional Skin/Subcutaneous Tissue Debridement with a total area of 32.4 sq cm performed by Fredirick Maudlin, MD. With the following instrument(s): Curette to remove Non-Viable tissue/material. Material removed includes Eschar, Subcutaneous Tissue, and Slough after achieving pain control using Lidocaine 5% topical ointment. No specimens were taken. A time out was conducted at 16:05, prior to the start of the procedure. A Minimum amount of bleeding was controlled with Pressure. The procedure was tolerated well with a pain level of 0 throughout and a pain level of 0 following the procedure. Post Debridement Measurements: 7.2cm length x 4.5cm width x 0.1cm depth; 2.545cm^3 volume. Character of Wound/Ulcer Post Debridement is improved. Post procedure Diagnosis Wound #1: Same as Pre-Procedure General Notes: Scribed for Dr. Celine Ahr by J.Scotton. Pre-procedure diagnosis of Wound #1 is a Lymphedema located on the Right,Posterior Lower Leg . There was  a Three Layer Compression Therapy Procedure by Dellie Catholic, RN. Post procedure Diagnosis Wound #1: Same as Pre-Procedure There was a Three Layer Compression Therapy Procedure by Dellie Catholic, RN. Post procedure Diagnosis Wound #: Same as Pre-Procedure Plan Follow-up Appointments: Return Appointment in 1 week. - Dr. Celine Ahr Room 2 Anesthetic: (In clinic) Topical Lidocaine 4% applied to wound bed Bathing/ Shower/ Hygiene: May shower with protection but do not get wound dressing(s) wet. Protect dressing(s) with water repellant cover (for example, large plastic bag) or a cast cover and may then take shower. - legs May shower and wash wound with  soap and water. - bottom Edema Control - Lymphedema / SCD / Other: Elevate legs to the level of the heart or above for 30 minutes daily and/or when sitting for 3-4 times a day throughout the day. Avoid standing for long periods of time. Moisturize legs daily. If compression wraps slide down please call wound center and speak with a nurse. Non Wound Condition: Other Non Wound Condition Orders/Instructions: - 3 layer wrap (Left Leg) MAYLINE, DRAGON B ([id number]) [id number] Page 7 of 9 Home Health: Admit to [facility] for skilled nursing wound care. May utilize formulary equivalent dressing for wound treatment orders unless otherwise specified. Dressing changes to be completed by [facility] on Monday / Wednesday / Friday except when patient has scheduled visit at [facility]. WOUND #1: - Lower Leg Wound Laterality: Right, Posterior Cleanser: Soap and Water 1 x Per Week/30 Days Discharge Instructions: May shower and wash wound with dial antibacterial soap and water prior to dressing change. Cleanser: Wound Cleanser 1 x Per Week/30 Days Discharge Instructions: Cleanse the wound with wound cleanser prior to applying a clean dressing using gauze sponges, not tissue or cotton balls. Peri-Wound Care: Sween Lotion (Moisturizing lotion) 1 x Per Week/30 Days Discharge Instructions: Apply moisturizing lotion as directed Prim Dressing: Sorbalgon AG Dressing 6x6 (in/in) 1 x Per Week/30 Days ary Discharge Instructions: Apply to wound bed as instructed Secondary Dressing: ABD Pad, 5x9 1 x Per Week/30 Days Discharge Instructions: Apply over primary dressing as directed. Secondary Dressing: Woven Gauze Sponge, Non-Sterile 4x4 in 1 x Per Week/30 Days Discharge Instructions: Apply over primary dressing as directed. Com pression Wrap: ThreePress (3 layer compression wrap) 1 x Per Week/30 Days Discharge Instructions: Apply three layer compression as directed. 05/24/2022: The  ulcers on her right lower leg are smaller and cleaner again today. Significantly less slough accumulation. Edema control is good. I used a curette to debride slough, nonviable subcutaneous tissue, and eschar from her wounds. They are clean enough now that I think we can discontinue Iodosorb and just use silver alginate. We will continue bilateral 3 layer compression to avoid her left leg swelling up and developing wounds as well. She will follow-up in 1 week. Electronic Signature(s) Signed: 05/24/2022 4:27:03 PM By: Fredirick Maudlin MD FACS Entered By: Fredirick Maudlin on 05/24/2022 16:27:03 -------------------------------------------------------------------------------- HxROS Details Patient Name: Date of Service: Letitia Neri, [name] 05/24/2022 3:45 PM Medical Record Number: [id number] Patient Account Number: [id number] Date of Birth/Sex: Treating RN: [birth year] (89 y.o. F) Primary Care Provider: Dellis Filbert Other Clinician: Referring Provider: Treating Provider/Extender: [name] in Treatment: 3 Information Obtained From Patient Eyes Medical History: Positive for: Cataracts Past Medical History Notes: diabetic retinopathy, macular degeneration Cardiovascular Medical History: Positive for: Hypertension Endocrine Medical History: Positive for: Type II Diabetes Time with diabetes: 12 yrs Treated with: Insulin Blood sugar tested every day: Yes Tested :  Musculoskeletal Medical History: Positive for: Osteoarthritis HBO Extended History Items ENMA, MAEDA ([id number]) [id number] Page 8 of 9 Eyes: Cataracts Immunizations Pneumococcal Vaccine: Received Pneumococcal Vaccination: No Implantable Devices None Hospitalization / Surgery History Type of Hospitalization/Surgery cataract extraction rectocele repair abdominal hysterectomy breast surgery c-eye surgery procedure eye surgery Family and Social History Never  smoker; Marital Status - Widowed; Alcohol Use: Never; Drug Use: No History; Caffeine Use: Never; Financial Concerns: No; Food, Clothing or Shelter Needs: No; Support System Lacking: No; Transportation Concerns: No Electronic Signature(s) Signed: 05/24/2022 4:28:07 PM By: Fredirick Maudlin MD FACS Entered By: Fredirick Maudlin on 05/24/2022 16:24:59 -------------------------------------------------------------------------------- SuperBill Details Patient Name: Date of Service: Letitia Neri, MA [name] 05/24/2022 Medical Record Number: [id number] Patient Account Number: [id number] Date of Birth/Sex: Treating RN: 1933-02-25 (89 y.o. F) Primary Care Provider: Dellis Filbert Other Clinician: Referring Provider: Treating Provider/Extender: [name] in Treatment: 3 Diagnosis Coding ICD-10 Codes Code Description [phone] 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 E11.622 Type 2 diabetes mellitus with other skin ulcer E11.40 Type 2 diabetes mellitus with diabetic neuropathy, unspecified I73.9 Peripheral vascular disease, unspecified H91.93 Unspecified hearing loss, bilateral R60.0 Localized edema Facility Procedures : CPT4 Code: [id number] Description: 08144 - DEB SUBQ TISSUE 20 SQ CM/< ICD-10 Diagnosis Description L97.812 Non-pressure chronic ulcer of other part of right lower leg with fat layer exp L97.212 Non-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 Modifier: osed Quantity: 1 Physician Procedures : CPT4 Code Description Modifier [id number] - WC PHYS LEVEL 3 - EST PT 25 ICD-10 Diagnosis Description 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 R60.0 Localized edema E11.622 Type 2 diabetes mellitus with other skin ulcer Quantity:  1 : [id number] - WC PHYS SUBQ TISS 20 SQ CM ICD-10 Diagnosis Description L97.812 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 Quantity: 1 : [id number] - WC PHYS SUBQ TISS EA ADDL 20 CM ICD-10 Diagnosis Description V67.209 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 Quantity: 1 Electronic Signature(s) Signed: 05/24/2022 4:27:33 PM By: Fredirick Maudlin MD FACS Entered By: Fredirick Maudlin on 05/24/2022 16:27:33

## 2022-05-28 NOTE — Progress Notes (Signed)
ORIELLE, COCKEY ([id number]) [id number] Page 1 of 8 Visit Report for 05/24/2022 Arrival Information Details Patient Name: Date of Service: [name], [name] 05/24/2022 3:45 PM Medical Record Number: [id number] Patient Account Number: [id number] Date of Birth/Sex: Treating RN: 01/16/1933 (89 y.o. [name] Primary Care [name]: [name] Other Clinician: Referring [name]: Treating [name]/Extender: [name] in Treatment: 3 Visit Information History Since Last Visit Added or deleted any medications: No Patient Arrived: Ambulatory Any new allergies or adverse reactions: No Arrival Time: 15:54 Had a fall or experienced change in No Accompanied By: granddaughter activities of daily living that may affect Transfer Assistance: None risk of falls: Patient Identification Verified: Yes Signs or symptoms of abuse/neglect since last visito No Secondary Verification Process Completed: Yes Hospitalized since last visit: No Implantable device outside of the clinic excluding No cellular tissue based products placed in the center since last visit: Has Dressing in Place as Prescribed: Yes Has Compression in Place as Prescribed: Yes Pain Present Now: Yes Electronic Signature(s) Signed: 05/27/2022 5:19:15 PM By: Kimberly Creamer RN, BSN Entered By: [name] on 05/24/2022 15:56:25 -------------------------------------------------------------------------------- Compression Therapy Details Patient Name: Date of Service: Kimberly [name], [name] [name]. 05/24/2022 3:45 PM Medical Record Number: [id number] Patient Account Number: [id number] Date of Birth/Sex: Treating RN: 11/24/1932 (89 y.o. [name] Primary Care [name]: [name] Other Clinician: Referring [name]: Treating [name]/Extender: [name] in Treatment: 3 Compression Therapy Performed for Wound Assessment: Wound #1 Right,Posterior Lower  Leg Performed By: Clinician Kimberly Catholic, RN Compression Type: Three Layer Post Procedure Diagnosis Same as Pre-procedure Electronic Signature(s) Signed: 05/24/2022 5:39:05 PM By: Kimberly Catholic RN Entered By: [name] on 05/24/2022 16:18:04 [address] [name] ([id number]) [id number] Page 2 of 8 -------------------------------------------------------------------------------- Compression Therapy Details Patient Name: Date of Service: [name], [name] 05/24/2022 3:45 PM Medical Record Number: [id number] Patient Account Number: [id number] Date of Birth/Sex: Treating RN: [birth year] (89 y.o. [name] Primary Care [name]: [name] Other Clinician: Referring [name]: Treating [name]/Extender: [name] in Treatment: 3 Compression Therapy Performed for Wound Assessment: NonWound Condition Lymphedema - Left Leg Performed By: Clinician Kimberly Catholic, RN Compression Type: Three Layer Post Procedure Diagnosis Same as Pre-procedure Electronic Signature(s) Signed: 05/24/2022 5:39:05 PM By: Kimberly Catholic RN Entered By: [name] on 05/24/2022 16:18:26 -------------------------------------------------------------------------------- Encounter Discharge Information Details Patient Name: Date of Service: Kimberly [name], [name] [name]. 05/24/2022 3:45 PM Medical Record Number: [id number] Patient Account Number: [id number] Date of Birth/Sex: Treating RN: 1932-10-21 (89 y.o. [name] Primary Care [name]: [name] Other Clinician: Referring [name]: Treating [name]/Extender: [name] in Treatment: 3 Encounter Discharge Information Items Post Procedure Vitals Discharge Condition: Stable Temperature (F): 97.9 Ambulatory Status: Wheelchair Pulse (bpm): 79 Discharge Destination: Home Respiratory Rate (breaths/min): 18 Transportation: Private Auto Blood Pressure (mmHg):  184/76 Accompanied By: granddaughter Schedule Follow-up Appointment: Yes Clinical Summary of Care: Patient Declined Electronic Signature(s) Signed: 05/24/2022 5:39:05 PM By: Kimberly Catholic RN Entered By: [name] on 05/24/2022 17:37:56 -------------------------------------------------------------------------------- Lower Extremity Assessment Details Patient Name: Date of Service: [name], [name] [name] 05/24/2022 3:45 PM Medical Record Number: [id number] Patient Account Number: [id number] Date of Birth/Sex: Treating RN: 1932-11-25 (89 y.o. [name] Primary Care Jibril Mcminn: [name] Other Clinician: Referring [name]: Treating Mireille Lacombe/Extender: [name] in Treatment: 3 Edema Assessment Assessed: [Left: No] [Right: No] [Left: Edema] [Right: :]  Calf Kimberly, [name] ([id number]) [id number] Page 3 of 8 Left: Right: Point of Measurement: From Medial Instep 38 cm 31 cm Ankle Left: Right: Point of Measurement: From Medial Instep 22.5 cm 21 cm Vascular Assessment Pulses: Dorsalis Pedis Palpable: [Right:Yes] Electronic Signature(s) Signed: 05/27/2022 5:19:15 PM By: Kimberly Creamer RN, BSN Entered By: [name] on 05/24/2022 16:03:48 -------------------------------------------------------------------------------- Multi Wound Chart Details Patient Name: Date of Service: Kimberly Neri, [name] [name]. 05/24/2022 3:45 PM Medical Record Number: [id number] Patient Account Number: [id number] Date of Birth/Sex: Treating RN: 1932/06/24 (89 y.o. F) Primary Care [name]: [name] Other Clinician: Referring [name]: Treating [name]/Extender: [name] in Treatment: 3 Vital Signs Height(in): 60 Pulse(bpm): 79 Weight(lbs): 165 Blood Pressure(mmHg): 184/76 Body Mass Index(BMI): 32.2 Temperature(F): 97.9 Respiratory Rate(breaths/min): 18 [1:Photos:] [N/A:N/A] Right, Posterior Lower Leg N/A  N/A Wound Location: Blister N/A N/A Wounding Event: Lymphedema N/A N/A Primary Etiology: Cataracts, Hypertension, Type II N/A N/A Comorbid History: Diabetes, Osteoarthritis 01/05/2022 N/A N/A Date Acquired: 3 N/A N/A [name] of Treatment: Open N/A N/A Wound Status: No N/A N/A Wound Recurrence: Yes N/A N/A Clustered Wound: 3 N/A N/A Clustered Quantity: 7.2x4.5x0.1 N/A N/A Measurements L x W x D (cm) 25.447 N/A N/A A (cm) : rea 2.545 N/A N/A Volume (cm) : 42.10% N/A N/A % Reduction in Area: 42.10% N/A N/A % Reduction in Volume: Full Thickness Without Exposed N/A N/A Classification: Support Structures Medium N/A N/A Exudate Amount: Serosanguineous N/A N/A Exudate Type: red, [name] N/A N/A Exudate Color: Distinct, outline attached N/A N/A Wound Margin: Medium (34-66%) N/A N/A Granulation AmountKATTIE, [name] ([id number]) [id number] Page 4 of 8 Red, [name] N/A N/A Granulation Quality: Medium (34-66%) N/A N/A Necrotic Amount: Eschar, Adherent Slough N/A N/A Necrotic Tissue: Fat Layer (Subcutaneous Tissue): Yes N/A N/A Exposed Structures: Fascia: No Tendon: No Muscle: No Joint: No Bone: No None N/A N/A Epithelialization: Debridement - Excisional N/A N/A Debridement: Pre-procedure Verification/Time Out 16:05 N/A N/A Taken: Lidocaine 5% topical ointment N/A N/A Pain Control: Necrotic/Eschar, Subcutaneous, N/A N/A Tissue Debrided: Slough Skin/Subcutaneous Tissue N/A N/A Level: 32.4 N/A N/A Debridement A (sq cm): rea Curette N/A N/A Instrument: Minimum N/A N/A Bleeding: Pressure N/A N/A Hemostasis Achieved: 0 N/A N/A Procedural Pain: 0 N/A N/A Post Procedural Pain: Debridement Treatment Response: Procedure was tolerated well N/A N/A Post Debridement Measurements L x 7.2x4.5x0.1 N/A N/A W x D (cm) 2.545 N/A N/A Post Debridement Volume: (cm) No Abnormalities Noted N/A N/A Periwound Skin Texture: Dry/Scaly: Yes  N/A N/A Periwound Skin Moisture: Hemosiderin Staining: Yes N/A N/A Periwound Skin Color: No Abnormality N/A N/A Temperature: Compression Therapy N/A N/A Procedures Performed: Debridement Treatment Notes Electronic Signature(s) Signed: 05/24/2022 4:20:36 PM By: Fredirick Maudlin MD FACS Entered By: Fredirick Maudlin on 05/24/2022 16:20:36 -------------------------------------------------------------------------------- Multi-Disciplinary Care Plan Details Patient Name: Date of Service: Kimberly [name], [name] [name]. 05/24/2022 3:45 PM Medical Record Number: [id number] Patient Account Number: [id number] Date of Birth/Sex: Treating RN: 1932/09/19 (89 y.o. [name] Primary Care Andrey Hoobler: [name] Other Clinician: Referring Kamariyah Timberlake: Treating Atlas Crossland/Extender: [name] in Treatment: 3 Active Inactive Necrotic Tissue Nursing Diagnoses: Impaired tissue integrity related to necrotic/devitalized tissue Knowledge deficit related to management of necrotic/devitalized tissue Goals: Necrotic/devitalized tissue will be minimized in the wound bed Date Initiated: 04/29/2022 Target Resolution Date: 06/25/2022 Goal Status: Active Patient/caregiver will verbalize understanding of reason and process for debridement of necrotic tissue Date Initiated: 04/29/2022 Target Resolution Date: 06/25/2022 Goal Status: Active Interventions: Assess patient pain level  pre-, during and post procedure and prior to discharge Provide education on necrotic tissue and debridement process [name], [name] ([id number]) [id number] Page 5 of 8 Treatment Activities: Apply topical anesthetic as ordered : 04/29/2022 Notes: Wound/Skin Impairment Nursing Diagnoses: Impaired tissue integrity Knowledge deficit related to ulceration/compromised skin integrity Goals: Patient/caregiver will verbalize understanding of skin care regimen Date Initiated: 04/29/2022 Target Resolution  Date: 06/25/2022 Goal Status: Active Interventions: Assess ulceration(s) every visit Treatment Activities: Skin care regimen initiated : 04/29/2022 Topical wound management initiated : 04/29/2022 Notes: Electronic Signature(s) Signed: 05/24/2022 5:39:05 PM By: Kimberly Catholic RN Entered By: [name] on 05/24/2022 17:36:20 -------------------------------------------------------------------------------- Pain Assessment Details Patient Name: Date of Service: Kimberly [name], [name] [name]. 05/24/2022 3:45 PM Medical Record Number: [id number] Patient Account Number: [id number] Date of Birth/Sex: Treating RN: October 31, 1932 (89 y.o. [name] Primary Care Kaan Tosh: [name] Other Clinician: Referring Lizmary Nader: Treating Nikole Swartzentruber/Extender: [name] in Treatment: 3 Active Problems Location of Pain Severity and Description of Pain Patient Has Paino Yes Site Locations Rate the pain. Current Pain Level: 5 Pain Management and Medication Current Pain Management: Electronic Signature(s) [name], [name] ([id number]) [id number] Page 6 of 8 Signed: 05/27/2022 5:19:15 PM By: Kimberly Creamer RN, BSN Entered By: [name] on 05/24/2022 16:02:50 -------------------------------------------------------------------------------- Patient/Caregiver Education Details Patient Name: Date of Service: Kimberly [name], [name] [name] 2/5/2024andnbsp3:45 PM Medical Record Number: [id number] Patient Account Number: [id number] Date of Birth/Gender: Treating RN: Nov 20, 1932 (89 y.o. [name] Primary Care Physician: [name] Other Clinician: Referring Physician: Treating Physician/Extender: [name] in Treatment: 3 Education Assessment Education Provided To: Patient Education Topics Provided Wound/Skin Impairment: Methods: Explain/Verbal Responses: Return demonstration correctly Electronic Signature(s) Signed: 05/24/2022 5:39:05  PM By: Kimberly Catholic RN Entered By: [name] on 05/24/2022 17:36:33 -------------------------------------------------------------------------------- Wound Assessment Details Patient Name: Date of Service: Kimberly [name], [name] [name] 05/24/2022 3:45 PM Medical Record Number: [id number] Patient Account Number: [id number] Date of Birth/Sex: Treating RN: December 12, 1932 (89 y.o. [name] Primary Care Tenika Keeran: [name] Other Clinician: Referring Renji Berwick: Treating Naomia Lenderman/Extender: [name] in Treatment: 3 Wound Status Wound Number: 1 Primary Etiology: Lymphedema Wound Location: Right, Posterior Lower Leg Wound Status: Open Wounding Event: Blister Comorbid History: Cataracts, Hypertension, Type II Diabetes, Osteoarthritis Date Acquired: 01/05/2022 [name] Of Treatment: 3 Clustered Wound: Yes Photos [name], [name] ([id number]) [id number] Page 7 of 8 Wound Measurements Length: (cm) 7.2 Width: (cm) 4.5 Depth: (cm) 0.1 Clustered Quantity: 3 Area: (cm) 25.447 Volume: (cm) 2.545 % Reduction in Area: 42.1% % Reduction in Volume: 42.1% Epithelialization: None Tunneling: No Undermining: No Wound Description Classification: Full Thickness Without Exposed Sup Wound Margin: Distinct, outline attached Exudate Amount: Medium Exudate Type: Serosanguineous Exudate Color: red, [name] port Structures Foul Odor After Cleansing: No Slough/Fibrino Yes Wound Bed Granulation Amount: Medium (34-66%) Exposed Structure Granulation Quality: Red, [name] Fascia Exposed: No Necrotic Amount: Medium (34-66%) Fat Layer (Subcutaneous Tissue) Exposed: Yes Necrotic Quality: Eschar, Adherent Slough Tendon Exposed: No Muscle Exposed: No Joint Exposed: No Bone Exposed: No Periwound Skin Texture Texture Color No Abnormalities Noted: Yes No Abnormalities Noted: No Hemosiderin Staining: Yes Moisture No Abnormalities Noted: No Temperature /  Pain Dry / Scaly: Yes Temperature: No Abnormality Treatment Notes Wound #1 (Lower Leg) Wound Laterality: Right, Posterior Cleanser Soap and Water Discharge Instruction: May shower and wash wound with dial antibacterial soap and water prior to dressing change. Wound Cleanser Discharge Instruction: Cleanse the wound with wound cleanser  prior to applying a clean dressing using gauze sponges, not tissue or cotton balls. Peri-Wound Care Sween Lotion (Moisturizing lotion) Discharge Instruction: Apply moisturizing lotion as directed Topical Primary Dressing Sorbalgon AG Dressing 6x6 (in/in) Discharge Instruction: Apply to wound bed as instructed Secondary Dressing ABD Pad, 5x9 Discharge Instruction: Apply over primary dressing as directed. Woven Gauze Sponge, Non-Sterile 4x4 in Discharge Instruction: Apply over primary dressing as directed. Secured With Compression Wrap ThreePress (3 layer compression wrap) Discharge Instruction: Apply three layer compression as directed. Compression Stockings Add-Ons Electronic Signature(s) Signed: 05/27/2022 5:19:15 PM By: Kimberly Creamer RN, BSN Entered By: [name] on 05/24/2022 16:06:47 [name] ([id number]) [id number] Page 8 of 8 -------------------------------------------------------------------------------- Vitals Details Patient Name: Date of Service: [facility], [name] 05/24/2022 3:45 PM Medical Record Number: [id number] Patient Account Number: [id number] Date of Birth/Sex: Treating RN: 12/13/1932 (89 y.o. [name] Primary Care Magdalynn Davilla: [name] Other Clinician: Referring Divon Krabill: Treating Obaloluwa Delatte/Extender: [name] in Treatment: 3 Vital Signs Time Taken: 15:56 Temperature (F): 97.9 Height (in): 60 Pulse (bpm): 79 Weight (lbs): 165 Respiratory Rate (breaths/min): 18 Body Mass Index (BMI): 32.2 Blood Pressure (mmHg): 184/76 Reference Range: 80 - 120 mg  / dl Electronic Signature(s) Signed: 05/27/2022 5:19:15 PM By: Kimberly Creamer RN, BSN Entered By: [name] on 05/24/2022 15:56:56

## 2022-06-04 ENCOUNTER — Encounter (HOSPITAL_BASED_OUTPATIENT_CLINIC_OR_DEPARTMENT_OTHER): Payer: Medicare Other | Admitting: General Surgery

## 2022-06-04 DIAGNOSIS — E11319 Type 2 diabetes mellitus with unspecified diabetic retinopathy without macular edema: Secondary | ICD-10-CM | POA: Diagnosis not present

## 2022-06-04 DIAGNOSIS — L97812 Non-pressure chronic 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: Secondary | ICD-10-CM | POA: Diagnosis not present

## 2022-06-04 DIAGNOSIS — I890 Lymphedema, not elsewhere classified: Secondary | ICD-10-CM | POA: Diagnosis not present

## 2022-06-04 DIAGNOSIS — I10 Essential (primary) hypertension: Secondary | ICD-10-CM | POA: Diagnosis not present

## 2022-06-04 DIAGNOSIS — E1140 Type 2 diabetes mellitus with diabetic neuropathy, unspecified: Secondary | ICD-10-CM | POA: Diagnosis not present

## 2022-06-04 DIAGNOSIS — E11622 Type 2 diabetes mellitus with other skin ulcer: Secondary | ICD-10-CM | POA: Diagnosis not present

## 2022-06-04 DIAGNOSIS — L97212 Non-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: Secondary | ICD-10-CM | POA: Diagnosis not present

## 2022-06-04 DIAGNOSIS — E1151 Type 2 diabetes mellitus with diabetic peripheral angiopathy without gangrene: Secondary | ICD-10-CM | POA: Diagnosis not present

## 2022-06-05 NOTE — Progress Notes (Addendum)
[name], [name] ([id number]) [id number] Page 1 of 9 Visit Report for 06/04/2022 Chief Complaint Document Details Patient Name: Date of Service: [facility] [name], [name] TTIE B. 06/04/2022 1:30 PM Medical Record Number: [id number] Patient Account Number: [id number] Date of Birth/Sex: Treating RN: January 13, 1933 (89 y.o. F) Primary Care Provider: Dellis Filbert Other Clinician: Referring Provider: Treating Provider/Extender: [name] in Treatment: 5 Information Obtained from: Patient Chief Complaint Patient presents for treatment of multiple open ulcers due to suspected venous insufficiency and a gluteal pressure ulcer Electronic Signature(s) Signed: 06/04/2022 2:26:14 PM By: Fredirick Maudlin MD FACS Entered By: Fredirick Maudlin on 06/04/2022 14:26:13 -------------------------------------------------------------------------------- Debridement Details Patient Name: Date of Service: [name] [name], [name] 06/04/2022 1:30 PM Medical Record Number: [id number] Patient Account Number: [id number] Date of Birth/Sex: Treating RN: Nov 05, 1932 (89 y.o. America Brown Primary Care Provider: Dellis Filbert Other Clinician: Referring Provider: Treating Provider/Extender: [name] in Treatment: 5 Debridement Performed for Assessment: Wound #1 Right,Posterior Lower Leg Performed By: Physician Fredirick Maudlin, MD Debridement Type: Debridement Level of Consciousness (Pre-procedure): Awake and Alert Pre-procedure Verification/Time Out Yes - 14:20 Taken: Start Time: 14:20 Pain Control: Lidocaine 4% T opical Solution T Area Debrided (L x W): otal 0.8 (cm) x 0.9 (cm) = 0.72 (cm) Tissue and other material debrided: Non-Viable, Eschar, Slough, Slough Level: Non-Viable Tissue Debridement Description: Selective/Open Wound Instrument: Curette Bleeding: Minimum Hemostasis Achieved: Pressure End Time: 14:21 Procedural Pain: 0 Post Procedural  Pain: 0 Response to Treatment: Procedure was tolerated well Level of Consciousness (Post- Awake and Alert procedure): Post Debridement Measurements of Total Wound Length: (cm) 0.8 Width: (cm) 0.9 Depth: (cm) 0.1 Volume: (cm) 0.057 Character of Wound/Ulcer Post Debridement: Improved Post Procedure Diagnosis DEADRA, GARZON B ([id number]) [id number].pdf Page 2 of 9 Same as Pre-procedure Notes Scribed for Dr. Celine Ahr by J.Scotton Electronic Signature(s) Signed: 06/04/2022 5:49:48 PM By: Dellie Catholic RN Signed: 06/07/2022 7:35:28 AM By: Fredirick Maudlin MD FACS Entered By: Dellie Catholic on 06/04/2022 17:07:15 -------------------------------------------------------------------------------- HPI Details Patient Name: Date of Service: Kimberly [name], [name] 06/04/2022 1:30 PM Medical Record Number: [id number] Patient Account Number: [id number] Date of Birth/Sex: Treating RN: [birth year] (89 y.o. F) Primary Care Provider: Dellis Filbert Other Clinician: Referring Provider: Treating Provider/Extender: [name] in Treatment: 5 History of Present Illness HPI Description: ADMISSION 04/29/2022 This is an 89 year old type II diabetic (no hemoglobin A1c available for review) who also has hypertension, neuropathy, peripheral angiopathy, chronic kidney disease, among other medical comorbidities. Her history is somewhat difficult to put together as she is extremely hard of hearing and according to her son, who accompanies her today, she does not reliably see physicians. There are apparently some concerns for self-neglect, but the family dynamics are complicated. She has 2+ pitting edema bilaterally with bilateral erythema suggestive of stasis dermatitis. What little outside information was sent ahead indicates that she was thought to potentially have cellulitis and is she is currently taking Keflex for this. She also has open wounds on her right lower  extremity on both the anterior, posterior, and medial aspects. They vary in depth but all have slough and nonviable subcutaneous tissue present. She has a small pressure ulcer on her left gluteus, just adjacent to the natal cleft. 05/06/2022: The anterior lower leg wound is healed. The posterior and medial lower leg wounds have accumulated a substantial amount of slough. Edema control bilaterally is markedly improved from last week. The pressure ulcer on her left buttock  is smaller and quite clean with just a small amount of slough and eschar present. 05/14/2022: The gluteal ulcer is healed. The ulcers on her posterior and medial lower leg are cleaner but still with a fair amount of slough buildup. They are less tender than on prior visits. Edema control bilaterally is good. 05/24/2022: The ulcers on her right lower leg are smaller and cleaner again today. Significantly less slough accumulation. Edema control is good. 06/04/2022: All of her wounds are nearly healed. The remaining open areas are small and superficial with just a little slough and eschar present. Edema control is excellent. Electronic Signature(s) Signed: 06/04/2022 2:26:42 PM By: Fredirick Maudlin MD FACS Entered By: Fredirick Maudlin on 06/04/2022 14:26:42 -------------------------------------------------------------------------------- Physical Exam Details Patient Name: Date of Service: Kimberly [name], [name] 06/04/2022 1:30 PM Medical Record Number: [id number] Patient Account Number: [id number] Date of Birth/Sex: Treating RN: 11/30/1932 (89 y.o. F) Primary Care Provider: Dellis Filbert Other Clinician: Referring Provider: Treating Provider/Extender: [name] in Treatment: 5 Constitutional Hypertensive, asymptomatic. . . . no acute distress. Respiratory [name], [name] ([id number]) [id number] Page 3 of 9 Normal work of breathing on room air. Notes 06/04/2022: All of her wounds  are nearly healed. The remaining open areas are small and superficial with just a little slough and eschar present. Edema control is excellent. Electronic Signature(s) Signed: 06/04/2022 2:27:09 PM By: Fredirick Maudlin MD FACS Entered By: Fredirick Maudlin on 06/04/2022 14:27:09 -------------------------------------------------------------------------------- Physician Orders Details Patient Name: Date of Service: Kimberly [name], [name] 06/04/2022 1:30 PM Medical Record Number: [id number] Patient Account Number: [id number] Date of Birth/Sex: Treating RN: Jun 14, 1932 (89 y.o. America Brown Primary Care Provider: Dellis Filbert Other Clinician: Referring Provider: Treating Provider/Extender: [name] in Treatment: 5 Verbal / Phone Orders: No Diagnosis Coding ICD-10 Coding Code Description [phone] 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 E11.622 Type 2 diabetes mellitus with other skin ulcer E11.40 Type 2 diabetes mellitus with diabetic neuropathy, unspecified I73.9 Peripheral vascular disease, unspecified H91.93 Unspecified hearing loss, bilateral R60.0 Localized edema Follow-up Appointments ppointment in 1 week. - Dr. Celine Ahr Room 2 Return A Anesthetic (In clinic) Topical Lidocaine 4% applied to wound bed Bathing/ Shower/ Hygiene May shower with protection but do not get wound dressing(s) wet. Protect dressing(s) with water repellant cover (for example, large plastic bag) or a cast cover and may then take shower. - legs May shower and wash wound with soap and water. - bottom Edema Control - Lymphedema / SCD / Other Elevate legs to the level of the heart or above for 30 minutes daily and/or when sitting for 3-4 times a day throughout the day. A void standing for long periods of time. Moisturize legs daily. If compression wraps slide down please  call wound center and speak with a nurse. Non Wound Condition Left Lower Extremity Other Non Wound Condition Orders/Instructions: - ++++3 layer wrap (Left Leg)++++ Home Health Admit to Home Health for skilled nursing wound care. May utilize formulary equivalent dressing for wound treatment orders unless otherwise specified. Dressing changes to be completed by [place] on Monday / Wednesday / Friday except when patient has scheduled visit at [facility]. Wound Treatment Wound #1 - Lower Leg Wound Laterality: Right, Posterior Cleanser: Soap and Water 1 x Per Week/30 Days Discharge Instructions: May shower and wash wound with dial antibacterial soap  and water prior to dressing change. Cleanser: Wound Cleanser 1 x Per Week/30 Days Discharge Instructions: Cleanse the wound with wound cleanser prior to applying a clean dressing using gauze sponges, not tissue or cotton balls. MARTAVIA, REISER ([id number]) [id number] Page 4 of 9 Peri-Wound Care: Sween Lotion (Moisturizing lotion) 1 x Per Week/30 Days Discharge Instructions: Apply moisturizing lotion as directed Prim Dressing: Sorbalgon AG Dressing 6x6 (in/in) 1 x Per Week/30 Days ary Discharge Instructions: Apply to wound bed as instructed Secondary Dressing: ABD Pad, 5x9 1 x Per Week/30 Days Discharge Instructions: Apply over primary dressing as directed. Secondary Dressing: Woven Gauze Sponge, Non-Sterile 4x4 in 1 x Per Week/30 Days Discharge Instructions: Apply over primary dressing as directed. Compression Wrap: ThreePress (3 layer compression wrap) 1 x Per Week/30 Days Discharge Instructions: Apply three layer compression as directed. Electronic Signature(s) Signed: 06/04/2022 3:03:37 PM By: Fredirick Maudlin MD FACS Entered By: Fredirick Maudlin on 06/04/2022 14:27:22 -------------------------------------------------------------------------------- Problem List Details Patient Name: Date of Service: Kimberly  [name], [name] 06/04/2022 1:30 PM Medical Record Number: [id number] Patient Account Number: [id number] Date of Birth/Sex: Treating RN: 1932-11-07 (89 y.o. F) Primary Care Provider: Dellis Filbert Other Clinician: Referring Provider: Treating Provider/Extender: [name] in Treatment: 5 Active Problems ICD-10 Encounter Code Description Active Date MDM Diagnosis [phone] Non-pressure chronic ulcer of other part of right lower leg with fat layer 04/29/2022 No Yes exposed L97.212 Non-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 04/29/2022 No Yes E11.622 Type 2 diabetes mellitus with other skin ulcer 04/29/2022 No Yes E11.40 Type 2 diabetes mellitus with diabetic neuropathy, unspecified 04/29/2022 No Yes I73.9 Peripheral vascular disease, unspecified 04/29/2022 No Yes H91.93 Unspecified hearing loss, bilateral 04/29/2022 No Yes R60.0 Localized edema 04/29/2022 No Yes Inactive Problems JARDYN, EALY ([id number]) [id number] Page 5 of 9 ICD-10 Code Description Active Date Inactive Date L89.322 Pressure ulcer of left buttock, stage 2 04/29/2022 04/29/2022 Resolved Problems Electronic Signature(s) Signed: 06/04/2022 2:25:12 PM By: Fredirick Maudlin MD FACS Entered By: Fredirick Maudlin on 06/04/2022 14:25:12 -------------------------------------------------------------------------------- Progress Note Details Patient Name: Date of Service: Kimberly [name], [name] 06/04/2022 1:30 PM Medical Record Number: [id number] Patient Account Number: [id number] Date of Birth/Sex: Treating RN: [birth year] (89 y.o. F) Primary Care Provider: Dellis Filbert Other Clinician: Referring Provider: Treating Provider/Extender: [name] in Treatment: 5 Subjective Chief Complaint Information obtained from Patient Patient presents for treatment of multiple open ulcers due to suspected venous insufficiency and a gluteal pressure  ulcer History of Present Illness (HPI) ADMISSION 04/29/2022 This is an 89 year old type II diabetic (no hemoglobin A1c available for review) who also has hypertension, neuropathy, peripheral angiopathy, chronic kidney disease, among other medical comorbidities. Her history is somewhat difficult to put together as she is extremely hard of hearing and according to her son, who accompanies her today, she does not reliably see physicians. There are apparently some concerns for self-neglect, but the family dynamics are complicated. She has 2+ pitting edema bilaterally with bilateral erythema suggestive of stasis dermatitis. What little outside information was sent ahead indicates that she was thought to potentially have cellulitis and is she is currently taking Keflex for this. She also has open wounds on her right lower extremity on both the anterior, posterior, and medial aspects. They vary in depth but all have slough and nonviable subcutaneous tissue present. She has a small pressure ulcer on her left gluteus, just adjacent to the natal cleft. 05/06/2022: The anterior lower  leg wound is healed. The posterior and medial lower leg wounds have accumulated a substantial amount of slough. Edema control bilaterally is markedly improved from last week. The pressure ulcer on her left buttock is smaller and quite clean with just a small amount of slough and eschar present. 05/14/2022: The gluteal ulcer is healed. The ulcers on her posterior and medial lower leg are cleaner but still with a fair amount of slough buildup. They are less tender than on prior visits. Edema control bilaterally is good. 05/24/2022: The ulcers on her right lower leg are smaller and cleaner again today. Significantly less slough accumulation. Edema control is good. 06/04/2022: All of her wounds are nearly healed. The remaining open areas are small and superficial with just a little slough and eschar present. Edema control  is excellent. Patient History Information obtained from Patient. Social History Never smoker, Marital Status - Widowed, Alcohol Use - Never, Drug Use - No History, Caffeine Use - Never. Medical History Eyes Patient has history of Cataracts Cardiovascular Patient has history of Hypertension Endocrine Patient has history of Type II Diabetes Musculoskeletal Patient has history of Osteoarthritis Hospitalization/Surgery History - cataract extraction. - rectocele repair. - abdominal hysterectomy. - breast surgery. - c-eye surgery procedure. - eye surgery. Medical A Surgical History Notes nd Eyes diabetic retinopathy, macular degeneration CURSTIN, NORTHROP B ([id number]) [id number] Page 6 of 9 Objective Constitutional Hypertensive, asymptomatic. no acute distress. Vitals Time Taken: 2:20 PM, Height: 60 in, Weight: 165 lbs, BMI: 32.2, Temperature: 97.9 F, Pulse: 77 bpm, Respiratory Rate: 16 breaths/min, Blood Pressure: 177/77 mmHg. Respiratory Normal work of breathing on room air. General Notes: 06/04/2022: All of her wounds are nearly healed. The remaining open areas are small and superficial with just a little slough and eschar present. Edema control is excellent. Integumentary (Hair, Skin) Wound #1 status is Open. Original cause of wound was Blister. The date acquired was: 01/05/2022. The wound has been in treatment 5 weeks. The wound is located on the Right,Posterior Lower Leg. The wound measures 0.8cm length x 0.9cm width x 0.1cm depth; 0.565cm^2 area and 0.057cm^3 volume. There is Fat Layer (Subcutaneous Tissue) exposed. There is no tunneling or undermining noted. There is a medium amount of serosanguineous drainage noted. The wound margin is distinct with the outline attached to the wound base. There is medium (34-66%) red, pink granulation within the wound bed. There is a medium (34-66%) amount of necrotic tissue within the wound bed including Eschar and  Adherent Slough. The periwound skin appearance had no abnormalities noted for texture. The periwound skin appearance exhibited: Dry/Scaly, Hemosiderin Staining. Periwound temperature was noted as No Abnormality. 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 There was a Selective/Open Wound Non-Viable Tissue Debridement with a total area of 0.72 sq cm performed by Fredirick Maudlin, MD. With the following instrument(s): Curette to remove Non-Viable tissue/material. Material removed includes Eschar and Slough and after achieving pain control using Lidocaine 4% T opical Solution. No specimens were taken. A time out was conducted at 14:20, prior to the start of the procedure. A Minimum amount of bleeding was controlled with Pressure. The procedure was tolerated  well with a pain level of 0 throughout and a pain level of 0 following the procedure. Post Debridement Measurements: 0.8cm length x 0.9cm width x 0.1cm depth; 0.057cm^3 volume. Character of Wound/Ulcer Post Debridement is improved. Post procedure Diagnosis Wound #1: Same as Pre-Procedure General Notes: Scribed for Dr. Celine Ahr by J.Scotton. Pre-procedure diagnosis of Wound #1 is a Lymphedema located on the Right,Posterior Lower Leg . There was a Three Layer Compression Therapy Procedure by Dellie Catholic, RN. Post procedure Diagnosis Wound #1: Same as Pre-Procedure Plan Follow-up Appointments: Return Appointment in 1 week. - Dr. Celine Ahr Room 2 Anesthetic: (In clinic) Topical Lidocaine 4%  applied to wound bed Bathing/ Shower/ Hygiene: May shower with protection but do not get wound dressing(s) wet. Protect dressing(s) with water repellant cover (for example, large plastic bag) or a cast cover and may then take shower. - legs May shower and wash wound with soap and water. - bottom Edema Control - Lymphedema / SCD / Other: Elevate legs to the level of the heart or above for 30 minutes daily and/or when sitting for 3-4 times a day throughout the day. ALLYE, MERRIWEATHER ([id number]) [id number] Page 7 of 9 Avoid standing for long periods of time. Moisturize legs daily. If compression wraps slide down please call wound center and speak with a nurse. Non Wound Condition: Other Non Wound Condition Orders/Instructions: - ++++3 layer wrap (Left Leg)++++ Home Health: Admit to Home Health for skilled nursing wound care. May utilize formulary equivalent dressing for wound treatment orders unless otherwise specified. Dressing changes to be completed by [place] on Monday / Wednesday / Friday except when patient has scheduled visit at [facility]. WOUND #1: - Lower Leg Wound Laterality: Right, Posterior Cleanser: Soap and Water 1 x Per Week/30 Days Discharge Instructions: May shower and wash wound with dial antibacterial soap and water prior to dressing change. Cleanser: Wound Cleanser 1 x Per Week/30 Days Discharge Instructions: Cleanse the wound with wound cleanser prior to applying a clean dressing using gauze sponges, not tissue or cotton balls. Peri-Wound Care: Sween Lotion (Moisturizing lotion) 1 x Per Week/30 Days Discharge Instructions: Apply moisturizing lotion as directed Prim Dressing: Sorbalgon AG Dressing 6x6 (in/in) 1 x Per Week/30 Days ary Discharge Instructions: Apply to wound bed as instructed Secondary Dressing: ABD Pad, 5x9 1 x Per Week/30 Days Discharge Instructions: Apply over primary dressing as directed. Secondary Dressing: Woven  Gauze Sponge, Non-Sterile 4x4 in 1 x Per Week/30 Days Discharge Instructions: Apply over primary dressing as directed. Com pression Wrap: ThreePress (3 layer compression wrap) 1 x Per Week/30 Days Discharge Instructions: Apply three layer compression as directed. 06/04/2022: All of her wounds are nearly healed. The remaining open areas are small and superficial with just a little slough and eschar present. Edema control is excellent. I used a curette to debride slough and eschar off of her wounds. We will continue silver alginate and 3 layer compression. We will also wrap her nonwounded leg to help maintain edema control. She will follow-up in 1 week. Anticipate she will likely be healed at that time. Electronic Signature(s) Signed: 06/07/2022 8:21:45 AM By: Fredirick Maudlin MD FACS Previous Signature: 06/04/2022 2:28:11 PM Version By: Fredirick Maudlin MD FACS Entered By: Fredirick Maudlin on 06/07/2022 08:21:45 -------------------------------------------------------------------------------- HxROS Details Patient Name: Date of Service: Kimberly [name], [name] 06/04/2022 1:30 PM Medical Record Number: [id number] Patient Account Number: [id number] Date of Birth/Sex: Treating RN: [birth year] (89 y.o. F) Primary Care Provider: Dellis Filbert Other Clinician: Referring Provider:  Treating Provider/Extender: Toy Baker Weeks in Treatment: 5 Information Obtained From Patient Eyes Medical History: Positive for: Cataracts Past Medical History Notes: diabetic retinopathy, macular degeneration Cardiovascular Medical History: Positive for: Hypertension Endocrine Medical History: Positive for: Type II Diabetes Time with diabetes: 36 yrs Treated with: Insulin Blood sugar tested every day: Yes Tested : Musculoskeletal JORDON, FALANA ([id number]) [id number] Page 8 of 9 Medical History: Positive for: Osteoarthritis HBO Extended History  Items Eyes: Cataracts Immunizations Pneumococcal Vaccine: Received Pneumococcal Vaccination: No Implantable Devices None Hospitalization / Surgery History Type of Hospitalization/Surgery cataract extraction rectocele repair abdominal hysterectomy breast surgery c-eye surgery procedure eye surgery Family and Social History Never smoker; Marital Status - Widowed; Alcohol Use: Never; Drug Use: No History; Caffeine Use: Never; Financial Concerns: No; Food, Clothing or Shelter Needs: No; Support System Lacking: No; Transportation Concerns: No Electronic Signature(s) Signed: 06/04/2022 3:03:37 PM By: Fredirick Maudlin MD FACS Entered By: Fredirick Maudlin on 06/04/2022 14:26:48 -------------------------------------------------------------------------------- SuperBill Details Patient Name: Date of Service: Letitia Neri, [name] 06/04/2022 Medical Record Number: [id number] Patient Account Number: [id number] Date of Birth/Sex: Treating RN: [birth year] (89 y.o. F) Primary Care Provider: Dellis Filbert Other Clinician: Referring Provider: Treating Provider/Extender: [name] in Treatment: 5 Diagnosis Coding ICD-10 Codes Code Description [id number] 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 E11.622 Type 2 diabetes mellitus with other skin ulcer E11.40 Type 2 diabetes mellitus with diabetic neuropathy, unspecified I73.9 Peripheral vascular disease, unspecified H91.93 Unspecified hearing loss, bilateral R60.0 Localized edema Facility Procedures : CPT4 Code: [id number] Description: [id number] - DEBRIDE WOUND 1ST 20 SQ CM OR < ICD-10 Diagnosis Description L97.812 Non-pressure chronic ulcer of other part of right lower leg with fat layer expos L97.212 Non-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 Modifier: ed Quantity: 1 Physician Procedures LANIYAH, STOCKSLAGER ([id number]): CPT4 Code Description [id number] - WC PHYS LEVEL 3 - EST PT ICD-10 Diagnosis Description L97.812 Non-pressure chronic ulcer of other part of right lower leg with fa L97.212 Non-pressure chronic ulcer of right calf with fat  layer exposed R60.0 Localized edema E11.622 Type 2 diabetes mellitus with other skin ulcer [id number] Page 9 of 9: Quantity Modifier 25 1 t layer exposed MARILOUISE, DAOUST B ([id number]): [id number] - WC PHYS DEBR WO ANESTH 20 SQ CM ICD-10 Diagnosis Description [id number] Non-pressure chronic ulcer of other part of right lower leg with fa L97.212 Non-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

## 2022-06-05 NOTE — Progress Notes (Signed)
SHAMEL, LAUWERS ([id number]) [id number] Page 1 of 7 Visit Report for 06/04/2022 Arrival Information Details Patient Name: Date of Service: [name], [name] 06/04/2022 1:30 PM Medical Record Number: [id number] Patient Account Number: [id number] Date of Birth/Sex: Treating RN: [birth year] (89 y.o. [name] Primary Care Baraa Tubbs: Dellis Filbert Other Clinician: Referring [name]: Treating [name]/Extender: [name] in [name]: 5 Visit Information History Since Last Visit Added or deleted any medications: No Patient Arrived: Ambulatory Any new allergies or adverse reactions: No Arrival Time: 13:57 Had a fall or experienced change in No Accompanied By: granddaughter activities of daily living that may affect Transfer Assistance: Manual risk of falls: Signs or symptoms of abuse/neglect since last visito No Hospitalized since last visit: No Implantable device outside of the clinic excluding No cellular tissue based products placed in the center since last visit: Has Dressing in Place as Prescribed: Yes Pain Present Now: Yes Electronic Signature(s) Signed: 06/04/2022 5:49:48 PM By: Kimberly Catholic RN Entered By: [name] on 06/04/2022 13:58:22 -------------------------------------------------------------------------------- Compression Therapy Details Patient Name: Date of Service: [name] [name], [name]. 06/04/2022 1:30 PM Medical Record Number: [id number] Patient Account Number: [id number] Date of Birth/Sex: Treating RN: 1932/06/23 (89 y.o. [name] Primary Care [name]: Dellis Filbert Other Clinician: Referring Heavin Sebree: Treating Maxximus Gotay/Extender: [name] in [name]: 5 Compression Therapy Performed for Wound Assessment: Wound #1 Right,Posterior Lower Leg Performed By: Clinician Kimberly Catholic, RN Compression Type: Three Layer Post Procedure Diagnosis Same as  Pre-procedure Electronic Signature(s) Signed: 06/04/2022 5:49:48 PM By: Kimberly Catholic RN Entered By: [name] on 06/04/2022 14:24:46 [address] [name] ([id number]) [phone].pdf Page 2 of 7 -------------------------------------------------------------------------------- Encounter Discharge Information Details Patient Name: Date of Service: [facility] [name], [name]. 06/04/2022 1:30 PM Medical Record Number: [id number] Patient Account Number: [id number] Date of Birth/Sex: Treating RN: 18-Mar-1933 (89 y.o. [name] Primary Care Jaloni Davoli: Dellis Filbert Other Clinician: Referring [name]: Treating [name]/Extender: [name] in [name]: 5 Encounter Discharge Information Items Post Procedure Vitals Discharge Condition: Stable Temperature (F): 97.9 Ambulatory Status: Ambulatory Pulse (bpm): 77 Discharge Destination: Home Respiratory Rate (breaths/min): 16 Transportation: Private Auto Blood Pressure (mmHg): 177/77 Accompanied By: [name] -law Schedule Follow-up Appointment: Yes Clinical Summary of Care: Patient Declined Electronic Signature(s) Signed: 06/04/2022 5:49:48 PM By: Kimberly Catholic RN Entered By: [name] on 06/04/2022 17:09:08 -------------------------------------------------------------------------------- Lower Extremity Assessment Details Patient Name: Date of Service: [name], [name]. 06/04/2022 1:30 PM Medical Record Number: [id number] Patient Account Number: [id number] Date of Birth/Sex: Treating RN: [birth year] (89 y.o. [name] Primary Care [name]: Dellis Filbert Other Clinician: Referring [name]: Treating [name]/Extender: [name] [name]: 5 Edema Assessment Assessed: [Left: No] [Right: No] [Left: Edema] [Right: :] Calf Left: Right: Point of Measurement: From Medial Instep 38 cm 31 cm Ankle Left: Right: Point of Measurement: From Medial  Instep 22.5 cm 21 cm Vascular Assessment Pulses: Dorsalis Pedis Palpable: [Left:Yes] [Right:Yes] Electronic Signature(s) Signed: 06/04/2022 5:49:48 PM By: Kimberly Catholic RN Entered By: [name] on 06/04/2022 14:24:18 Multi Wound Chart Details -------------------------------------------------------------------------------- [name] ([id number].pdf Page 3 of 7 Patient Name: Date of Service: [name], [name]. 06/04/2022 1:30 PM Medical Record Number: [id number] Patient Account Number: [id number] Date of Birth/Sex: Treating RN: 06-24-1932 (89 y.o. F) Primary Care [name]: Dellis Filbert Other Clinician: Referring [name]: Treating [name]/Extender: [name] [name]: 5 Vital Signs  Height(in): 60 Pulse(bpm): 77 Weight(lbs): 165 Blood Pressure(mmHg): 177/77 Body Mass Index(BMI): 32.2 Temperature(F): 97.9 Respiratory Rate(breaths/min): 16 Wound Assessments Wound Number: 1 N/A N/A Photos: N/A N/A Right, Posterior Lower Leg N/A N/A Wound Location: Blister N/A N/A Wounding Event: Lymphedema N/A N/A Primary Etiology: Cataracts, Hypertension, Type II N/A N/A Comorbid History: Diabetes, Osteoarthritis 01/05/2022 N/A N/A Date Acquired: 5 N/A N/A Kimberly of [name]: Open N/A N/A Wound Status: No N/A N/A Wound Recurrence: Yes N/A N/A Clustered Wound: 3 N/A N/A Clustered Quantity: 0.8x0.9x0.1 N/A N/A Measurements L x W x D (cm) 0.565 N/A N/A A (cm) : rea 0.057 N/A N/A Volume (cm) : 98.70% N/A N/A % Reduction in Area: 98.70% N/A N/A % Reduction in Volume: Full Thickness Without Exposed N/A N/A Classification: Support Structures Medium N/A N/A Exudate A mount: Serosanguineous N/A N/A Exudate Type: red, [name] N/A N/A Exudate Color: Distinct, outline attached N/A N/A Wound Margin: Medium (34-66%) N/A N/A Granulation Amount: Red, Pink N/A N/A Granulation Quality: Medium (34-66%)  N/A N/A Necrotic Amount: Eschar, Adherent Slough N/A N/A Necrotic Tissue: Fat Layer (Subcutaneous Tissue): Yes N/A N/A Exposed Structures: Fascia: No Tendon: No Muscle: No Joint: No Bone: No None N/A N/A Epithelialization: No Abnormalities Noted N/A N/A Periwound Skin Texture: Dry/Scaly: Yes N/A N/A Periwound Skin Moisture: Hemosiderin Staining: Yes N/A N/A Periwound Skin Color: No Abnormality N/A N/A Temperature: Compression Therapy N/A N/A Procedures Performed: [name] Notes Electronic Signature(s) Signed: 06/04/2022 2:26:05 PM By: Fredirick Maudlin MD FACS Entered By: Fredirick Maudlin on 06/04/2022 14:26:05 Threat, Amen [name] ([id number] Page 4 of 7 -------------------------------------------------------------------------------- Multi-Disciplinary Care Plan Details Patient Name: Date of Service: [facility], [name]. 06/04/2022 1:30 PM Medical Record Number: [id number] Patient Account Number: [id number] Date of Birth/Sex: Treating RN: 1933-01-09 (89 y.o. [name] Primary Care Erdem Naas: Dellis Filbert Other Clinician: Referring Kalyan Barabas: Treating Giancarlo Askren/Extender: [name] in [name]: 5 Active Inactive Necrotic Tissue Nursing Diagnoses: Impaired tissue integrity related to necrotic/devitalized tissue Knowledge deficit related to management of necrotic/devitalized tissue Goals: Necrotic/devitalized tissue will be minimized in the wound bed Date Initiated: 04/29/2022 Target Resolution Date: 06/25/2022 Goal Status: Active Patient/caregiver will verbalize understanding of reason and process for debridement of necrotic tissue Date Initiated: 04/29/2022 Target Resolution Date: 06/25/2022 Goal Status: Active Interventions: Assess patient pain level pre-, during and post procedure and prior to discharge Provide education on necrotic tissue and debridement process [name] Activities: Apply topical  anesthetic as ordered : 04/29/2022 Notes: Wound/Skin Impairment Nursing Diagnoses: Impaired tissue integrity Knowledge deficit related to ulceration/compromised skin integrity Goals: Patient/caregiver will verbalize understanding of skin care regimen Date Initiated: 04/29/2022 Target Resolution Date: 06/25/2022 Goal Status: Active Interventions: Assess ulceration(s) every visit [name] Activities: Skin care regimen initiated : 04/29/2022 Topical wound management initiated : 04/29/2022 Notes: Electronic Signature(s) Signed: 06/04/2022 5:49:48 PM By: Kimberly Catholic RN Entered By: [name] on 06/04/2022 16:59:35 -------------------------------------------------------------------------------- Pain Assessment Details Patient Name: Date of Service: [name] [name], [name]. 06/04/2022 1:30 PM Medical Record Number: [id number] Patient Account Number: [id number] Date of Birth/Sex: Treating RN: 09/05/1932 (89 y.o. [name] Primary Care Ricca Melgarejo: Dellis Filbert Other Clinician: Referring Merrell Borsuk: Treating Brittinie Wherley/Extender: [name] in [name]: [address] [name] ([id number]) [id number] Page 5 of 7 Active Problems Location of Pain Severity and Description of Pain Patient Has Paino Yes Site Locations Pain Location: Generalized Pain With Dressing Change: No Duration of the Pain. Constant / Intermittento Constant Rate the pain. Current Pain Level: 4 Worst  Pain Level: 10 Least Pain Level: 3 Tolerable Pain Level: 3 Character of Pain Describe the Pain: Difficult to Pinpoint Pain Management and Medication Current Pain Management: Medication: No Cold Application: No Rest: Yes Massage: No Activity: No T.E.N.S.: No Heat Application: No Leg drop or elevation: No Is the Current Pain Management Adequate: Adequate How does your wound impact your activities of daily livingo Sleep: No Bathing: No Appetite: No Relationship  With Others: No Bladder Continence: No Emotions: No Bowel Continence: No Work: No Toileting: No Drive: No Dressing: No Hobbies: No Electronic Signature(s) Signed: 06/04/2022 5:49:48 PM By: Kimberly Catholic RN Entered By: [name] on 06/04/2022 14:23:59 -------------------------------------------------------------------------------- Patient/Caregiver Education Details Patient Name: Date of Service: Kimberly Neri, [name] 2/16/2024andnbsp1:30 PM Medical Record Number: [id number] Patient Account Number: [id number] Date of Birth/Gender: Treating RN: Aug 10, 1932 (89 y.o. [name] Primary Care Physician: Dellis Filbert Other Clinician: Referring Physician: Treating Physician/Extender: [name] in [name]: 5 Education Assessment Education Provided To: Patient Education Topics Provided Wound/Skin Impairment: Methods: Explain/Verbal Responses: Return demonstration correctly [name], [name] ([id number]) [phone].pdf Page 6 of 7 Electronic Signature(s) Signed: 06/04/2022 5:49:48 PM By: Kimberly Catholic RN Entered By: [name] on 06/04/2022 17:01:04 -------------------------------------------------------------------------------- Wound Assessment Details Patient Name: Date of Service: Kimberly [name], [name]. 06/04/2022 1:30 PM Medical Record Number: [id number] Patient Account Number: [id number] Date of Birth/Sex: Treating RN: 03-22-1933 (89 y.o. [name] Primary Care Chaska Hagger: Dellis Filbert Other Clinician: Referring Baron Parmelee: Treating Jing Howatt/Extender: [name] in [name]: 5 Wound Status Wound Number: 1 Primary Etiology: Lymphedema Wound Location: Right, Posterior Lower Leg Wound Status: Open Wounding Event: Blister Comorbid History: Cataracts, Hypertension, Type II Diabetes, Osteoarthritis Date Acquired: 01/05/2022 Kimberly Of [name]: 5 Clustered Wound: Yes Photos Wound  Measurements Length: (cm) Width: (cm) Depth: (cm) Clustered Quantity: Area: (cm) Volume: (cm) 0.8 % Reduction in Area: 98.7% 0.9 % Reduction in Volume: 98.7% 0.1 Epithelialization: None 3 Tunneling: No 0.565 Undermining: No 0.057 Wound Description Classification: Full Thickness Without Exposed Sup Wound Margin: Distinct, outline attached Exudate Amount: Medium Exudate Type: Serosanguineous Exudate Color: red, [name] port Structures Foul Odor After Cleansing: No Slough/Fibrino Yes Wound Bed Granulation Amount: Medium (34-66%) Exposed Structure Granulation Quality: Red, Pink Fascia Exposed: No Necrotic Amount: Medium (34-66%) Fat Layer (Subcutaneous Tissue) Exposed: Yes Necrotic Quality: Eschar, Adherent Slough Tendon Exposed: No Muscle Exposed: No Joint Exposed: No Bone Exposed: No Periwound Skin Texture Texture Color No Abnormalities Noted: Yes No Abnormalities Noted: No Hemosiderin Staining: Yes Moisture [name], [name] ([id number]) [phone].pdf Page 7 of 7 No Abnormalities Noted: No Temperature / Pain Dry / Scaly: Yes Temperature: No Abnormality [name] Notes Wound #1 (Lower Leg) Wound Laterality: Right, Posterior Cleanser Soap and Water Discharge Instruction: May shower and wash wound with dial antibacterial soap and water prior to dressing change. Wound Cleanser Discharge Instruction: Cleanse the wound with wound cleanser prior to applying a clean dressing using gauze sponges, not tissue or cotton balls. Peri-Wound Care Sween Lotion (Moisturizing lotion) Discharge Instruction: Apply moisturizing lotion as directed Topical Primary Dressing Sorbalgon AG Dressing 6x6 (in/in) Discharge Instruction: Apply to wound bed as instructed Secondary Dressing ABD Pad, 5x9 Discharge Instruction: Apply over primary dressing as directed. Woven Gauze Sponge, Non-Sterile 4x4 in Discharge Instruction: Apply over primary dressing as  directed. Secured With Compression Wrap ThreePress (3 layer compression wrap) Discharge Instruction: Apply three layer compression as directed. Compression Stockings Add-Ons Electronic Signature(s) Signed: 06/04/2022 5:49:48 PM By: Kimberly Catholic RN Entered  By: [name] on 06/04/2022 14:17:31 -------------------------------------------------------------------------------- Vitals Details Patient Name: Date of Service: [facility] [address], [name]. 06/04/2022 1:30 PM Medical Record Number: [id number] Patient Account Number: [id number] Date of Birth/Sex: Treating RN: 02/11/1933 (89 y.o. [name] Primary Care Lajean Boese: Dellis Filbert Other Clinician: Referring Gerhardt Gleed: Treating Jahseh Lucchese/Extender: [name] in [name]: 5 Vital Signs Time Taken: 14:20 Temperature (F): 97.9 Height (in): 60 Pulse (bpm): 77 Weight (lbs): 165 Respiratory Rate (breaths/min): 16 Body Mass Index (BMI): 32.2 Blood Pressure (mmHg): 177/77 Reference Range: 80 - 120 mg / dl Electronic Signature(s) Signed: 06/04/2022 5:49:48 PM By: Kimberly Catholic RN Entered By: [name] on 06/04/2022 14:23:21

## 2022-06-14 ENCOUNTER — Encounter (HOSPITAL_BASED_OUTPATIENT_CLINIC_OR_DEPARTMENT_OTHER): Payer: Medicare Other | Admitting: General Surgery

## 2022-06-15 ENCOUNTER — Encounter (HOSPITAL_BASED_OUTPATIENT_CLINIC_OR_DEPARTMENT_OTHER): Payer: Medicare Other | Admitting: General Surgery

## 2022-06-15 DIAGNOSIS — I10 Essential (primary) hypertension: Secondary | ICD-10-CM | POA: Diagnosis not present

## 2022-06-15 DIAGNOSIS — L97812 Non-pressure chronic 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: Secondary | ICD-10-CM | POA: Diagnosis not present

## 2022-06-15 DIAGNOSIS — E1140 Type 2 diabetes mellitus with diabetic neuropathy, unspecified: Secondary | ICD-10-CM | POA: Diagnosis not present

## 2022-06-15 DIAGNOSIS — I890 Lymphedema, not elsewhere classified: Secondary | ICD-10-CM | POA: Diagnosis not present

## 2022-06-15 DIAGNOSIS — L97212 Non-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: Secondary | ICD-10-CM | POA: Diagnosis not present

## 2022-06-15 DIAGNOSIS — E1151 Type 2 diabetes mellitus with diabetic peripheral angiopathy without gangrene: Secondary | ICD-10-CM | POA: Diagnosis not present

## 2022-06-15 DIAGNOSIS — E11622 Type 2 diabetes mellitus with other skin ulcer: Secondary | ICD-10-CM | POA: Diagnosis not present

## 2022-06-15 DIAGNOSIS — I872 Venous insufficiency (chronic) (peripheral): Secondary | ICD-10-CM | POA: Diagnosis not present

## 2022-06-15 DIAGNOSIS — E11319 Type 2 diabetes mellitus with unspecified diabetic retinopathy without macular edema: Secondary | ICD-10-CM | POA: Diagnosis not present

## 2022-06-16 NOTE — Progress Notes (Signed)
[name] ([id number]) [id number] Page 1 of 7 Visit Report for 06/15/2022 Arrival Information Details Patient Name: Date of Service: [name], [name] 06/15/2022 9:45 A M Medical Record Number: [id number] Patient Account Number: [id number] Date of Birth/Sex: Treating RN: 03/04/1933 (89 y.o. Martyn Malay, Vaughan Basta Primary Care Ryli Standlee: Dellis Filbert Other Clinician: Referring Karleigh Bunte: Treating Nico Rogness/Extender: [name] in Treatment: 6 Visit Information History Since Last Visit Added or deleted any medications: No Patient Arrived: Ambulatory Any new allergies or adverse reactions: No Arrival Time: 09:53 Had a fall or experienced change in No Accompanied By: granddaughter activities of daily living that may affect Transfer Assistance: None risk of falls: Patient Identification Verified: Yes Signs or symptoms of abuse/neglect since last visito No Secondary Verification Process Completed: Yes Hospitalized since last visit: No Patient Requires Transmission-Based Precautions: No Implantable device outside of the clinic excluding No Patient Has Alerts: No cellular tissue based products placed in the center since last visit: Has Dressing in Place as Prescribed: Yes Has Compression in Place as Prescribed: Yes Pain Present Now: No Electronic Signature(s) Signed: 06/15/2022 2:56:12 PM By: Baruch Gouty RN, BSN Entered By: Baruch Gouty on 06/15/2022 09:55:15 -------------------------------------------------------------------------------- Clinic Level of Care Assessment Details Patient Name: Date of Service: [name] TTIE [name]. 06/15/2022 9:45 A M Medical Record Number: [id number] Patient Account Number: [id number] Date of Birth/Sex: Treating RN: 12/27/1932 (89 y.o. [name] Primary Care Genna Casimir: Dellis Filbert Other Clinician: Referring Mael Delap: Treating Indianna Boran/Extender: [name] in  Treatment: 6 Clinic Level of Care Assessment Items TOOL 4 Quantity Score '[]'$  - 0 Use when only an EandM is performed on FOLLOW-UP visit ASSESSMENTS - Nursing Assessment / Reassessment X- 1 10 Reassessment of Co-morbidities (includes updates in patient status) X- 1 5 Reassessment of Adherence to Treatment Plan ASSESSMENTS - Wound and Skin A ssessment / Reassessment X - Simple Wound Assessment / Reassessment - one wound 1 5 '[]'$  - 0 Complex Wound Assessment / Reassessment - multiple wounds '[]'$  - 0 Dermatologic / Skin Assessment (not related to wound area) ASSESSMENTS - Focused Assessment X- 1 5 Circumferential Edema Measurements - multi extremities '[]'$  - 0 Nutritional Assessment / Counseling / Intervention X- 1 5 Lower Extremity Assessment (monofilament, tuning fork, pulses) '[]'$  - 0 Peripheral Arterial Disease Assessment (using hand held doppler) ASSESSMENTS - Ostomy and/or Continence Assessment and Care '[]'$  - 0 Incontinence Assessment and Management '[]'$  - 0 Ostomy Care Assessment and Management (repouching, etc.) PROCESS - Coordination of Care MELAH, SCHUPBACH ([id number]) [id number] Page 2 of 7 X- 1 15 Simple Patient / Family Education for ongoing care '[]'$  - 0 Complex (extensive) Patient / Family Education for ongoing care X- 1 10 Staff obtains Programmer, systems, Records, T Results / Process Orders est '[]'$  - 0 Staff telephones HHA, Nursing Homes / Clarify orders / etc '[]'$  - 0 Routine Transfer to another Facility (non-emergent condition) '[]'$  - 0 Routine Hospital Admission (non-emergent condition) '[]'$  - 0 New Admissions / Biomedical engineer / Ordering NPWT Apligraf, etc. , '[]'$  - 0 Emergency Hospital Admission (emergent condition) X- 1 10 Simple Discharge Coordination '[]'$  - 0 Complex (extensive) Discharge Coordination PROCESS - Special Needs '[]'$  - 0 Pediatric / Minor Patient Management '[]'$  - 0 Isolation Patient Management '[]'$  - 0 Hearing / Language /  Visual special needs '[]'$  - 0 Assessment of Community assistance (transportation, D/C planning, etc.) '[]'$  - 0 Additional assistance / Altered mentation '[]'$  - 0 Support Surface(s) Assessment (bed,  cushion, seat, etc.) INTERVENTIONS - Wound Cleansing / Measurement X - Simple Wound Cleansing - one wound 1 5 '[]'$  - 0 Complex Wound Cleansing - multiple wounds X- 1 5 Wound Imaging (photographs - any number of wounds) '[]'$  - 0 Wound Tracing (instead of photographs) '[]'$  - 0 Simple Wound Measurement - one wound '[]'$  - 0 Complex Wound Measurement - multiple wounds INTERVENTIONS - Wound Dressings '[]'$  - 0 Small Wound Dressing one or multiple wounds '[]'$  - 0 Medium Wound Dressing one or multiple wounds '[]'$  - 0 Large Wound Dressing one or multiple wounds '[]'$  - 0 Application of Medications - topical '[]'$  - 0 Application of Medications - injection INTERVENTIONS - Miscellaneous '[]'$  - 0 External ear exam '[]'$  - 0 Specimen Collection (cultures, biopsies, blood, body fluids, etc.) '[]'$  - 0 Specimen(s) / Culture(s) sent or taken to Lab for analysis '[]'$  - 0 Patient Transfer (multiple staff / Civil Service fast streamer / Similar devices) '[]'$  - 0 Simple Staple / Suture removal (25 or less) '[]'$  - 0 Complex Staple / Suture removal (26 or more) '[]'$  - 0 Hypo / Hyperglycemic Management (close monitor of Blood Glucose) '[]'$  - 0 Ankle / Brachial Index (ABI) - do not check if billed separately X- 1 5 Vital Signs Has the patient been seen at the hospital within the last three years: Yes Total Score: 80 Level Of Care: New/Established - Level 3 Electronic Signature(s) Signed: 06/15/2022 2:56:12 PM By: Baruch Gouty RN, BSN [place], [name] ([id number]) [phone].pdf Page 3 of 7 Entered By: Baruch Gouty on 06/15/2022 10:23:13 -------------------------------------------------------------------------------- Encounter Discharge Information Details Patient Name: Date of Service: [facility], [name] 06/15/2022 9:45 A  M Medical Record Number: [id number] Patient Account Number: [id number] Date of Birth/Sex: Treating RN: Jan 09, 1933 (89 y.o. [name] Primary Care Laneka Mcgrory: Dellis Filbert Other Clinician: Referring Ranbir Chew: Treating Chastity Noland/Extender: [name] in Treatment: 6 Encounter Discharge Information Items Discharge Condition: Stable Ambulatory Status: Ambulatory Discharge Destination: Home Transportation: Private Auto Accompanied By: granddaughter Schedule Follow-up Appointment: Yes Clinical Summary of Care: Patient Declined Electronic Signature(s) Signed: 06/15/2022 2:56:12 PM By: Baruch Gouty RN, BSN Entered By: Baruch Gouty on 06/15/2022 10:23:59 -------------------------------------------------------------------------------- Lower Extremity Assessment Details Patient Name: Date of Service: [name] TT[facility] [name]. 06/15/2022 9:45 A M Medical Record Number: [id number] Patient Account Number: [id number] Date of Birth/Sex: Treating RN: Feb 22, 1933 (89 y.o. [name] Primary Care Symantha Steeber: Dellis Filbert Other Clinician: Referring Silas Sedam: Treating Lasheka Kempner/Extender: [name] in Treatment: 6 Edema Assessment Assessed: [Left: No] [Right: No] Edema: [Left: Yes] [Right: Yes] Calf Left: Right: Point of Measurement: From Medial Instep 29 cm 28.8 cm Ankle Left: Right: Point of Measurement: From Medial Instep 19 cm 19 cm Vascular Assessment Pulses: Dorsalis Pedis Palpable: [Left:Yes] [Right:Yes] Electronic Signature(s) Signed: 06/15/2022 2:56:12 PM By: Baruch Gouty RN, BSN Entered By: Baruch Gouty on 06/15/2022 10:01:41 -------------------------------------------------------------------------------- Multi Wound Chart Details Patient Name: Date of Service: [name] [facility] [name]. 06/15/2022 9:45 A M Medical Record Number: [id number] Patient Account Number: [id number] Date of Birth/Sex: Treating RN: Nov 04, 1932 (89 y.o.  F) Primary Care Thane Age: Dellis Filbert Other Clinician: Referring Celester Lech: Treating Maleny Candy/Extender: Gudrun, Caspary, Trenisha [name] ([id number]) [id number] Page 4 of 7 Weeks in Treatment: 6 Vital Signs Height(in): 60 Capillary Blood Glucose(mg/dl): 131 Weight(lbs): 165 Pulse(bpm): 80 Body Mass Index(BMI): 32.2 Blood Pressure(mmHg): 147/79 Temperature(F): 98.4 Respiratory Rate(breaths/min): 18 [1:Photos:] [N/A:N/A] Right, Posterior Lower Leg N/A N/A Wound Location: Blister N/A N/A  Wounding Event: Lymphedema N/A N/A Primary Etiology: Cataracts, Hypertension, Type II N/A N/A Comorbid History: Diabetes, Osteoarthritis 01/05/2022 N/A N/A Date Acquired: 6 N/A N/A Weeks of Treatment: Open N/A N/A Wound Status: No N/A N/A Wound Recurrence: Yes N/A N/A Clustered Wound: 3 N/A N/A Clustered Quantity: 0x0x0 N/A N/A Measurements L x W x D (cm) 0 N/A N/A A (cm) : rea 0 N/A N/A Volume (cm) : 100.00% N/A N/A % Reduction in Area: 100.00% N/A N/A % Reduction in Volume: Full Thickness Without Exposed N/A N/A Classification: Support Structures None Present N/A N/A Exudate Amount: None Present (0%) N/A N/A Granulation Amount: None Present (0%) N/A N/A Necrotic Amount: Fat Layer (Subcutaneous Tissue): Yes N/A N/A Exposed Structures: Fascia: No Tendon: No Muscle: No Joint: No Bone: No Large (67-100%) N/A N/A Epithelialization: No Abnormalities Noted N/A N/A Periwound Skin Texture: Dry/Scaly: Yes N/A N/A Periwound Skin Moisture: Hemosiderin Staining: Yes N/A N/A Periwound Skin Color: No Abnormality N/A N/A Temperature: Treatment Notes Electronic Signature(s) Signed: 06/15/2022 10:39:16 AM By: Fredirick Maudlin MD FACS Entered By: Fredirick Maudlin on 06/15/2022 10:39:16 -------------------------------------------------------------------------------- Multi-Disciplinary Care Plan Details Patient Name: Date of  Service: [name] [facility] [name]. 06/15/2022 9:45 A M Medical Record Number: [id number] Patient Account Number: [id number] Date of Birth/Sex: Treating RN: [birth year] (89 y.o. [name] Primary Care Tacey Dimaggio: Dellis Filbert Other Clinician: Referring Maliha Outten: Treating Chijioke Lasser/Extender: [name] in Treatment: [address] MILILANI, DEGNAN ([id number]) [id number] Page 5 of 7 Electronic Signature(s) Signed: 06/15/2022 2:56:12 PM By: Baruch Gouty RN, BSN Entered By: Baruch Gouty on 06/15/2022 10:10:44 -------------------------------------------------------------------------------- Pain Assessment Details Patient Name: Date of Service: [name] [facility] [name]. 06/15/2022 9:45 A M Medical Record Number: [id number] Patient Account Number: [id number] Date of Birth/Sex: Treating RN: 26-Nov-1932 (89 y.o. [name] Primary Care Meris Reede: Dellis Filbert Other Clinician: Referring Danny Yackley: Treating Shakoya Gilmore/Extender: [name] in Treatment: 6 Active Problems Location of Pain Severity and Description of Pain Patient Has Paino No Site Locations Rate the pain. Current Pain Level: 0 Pain Management and Medication Current Pain Management: Electronic Signature(s) Signed: 06/15/2022 2:56:12 PM By: Baruch Gouty RN, BSN Entered By: Baruch Gouty on 06/15/2022 09:56:09 -------------------------------------------------------------------------------- Patient/Caregiver Education Details Patient Name: Date of Service: [name] 2/27/2024andnbsp9:45 A M Medical Record Number: [id number] Patient Account Number: [id number] Date of Birth/Gender: Treating RN: 1932-05-22 (89 y.o. [name] Primary Care Physician: Dellis Filbert Other Clinician: Referring Physician: Treating Physician/Extender: [name] in Treatment: 6 Education Assessment Education Provided  To: Patient Education Topics Provided Venous: Methods: Explain/Verbal Responses: Reinforcements needed, State content correctly Wound/Skin Impairment: Methods: Explain/Verbal Responses: Reinforcements needed, State content correctly SHANTESE, LAWES ([id number]) [id number] Page 6 of 7 Electronic Signature(s) Signed: 06/15/2022 2:56:12 PM By: Baruch Gouty RN, BSN Entered By: Baruch Gouty on 06/15/2022 10:11:12 -------------------------------------------------------------------------------- Wound Assessment Details Patient Name: Date of Service: [name] [facility] [name]. 06/15/2022 9:45 A M Medical Record Number: [id number] Patient Account Number: [id number] Date of Birth/Sex: Treating RN: 1932/07/09 (89 y.o. [name] Primary Care Tyeisha Dinan: Dellis Filbert Other Clinician: Referring Neita Landrigan: Treating Artemis Koller/Extender: Toy Baker Weeks in Treatment: 6 Wound Status Wound Number: 1 Primary Etiology: Lymphedema Wound Location: Right, Posterior Lower Leg Wound Status: Open Wounding Event: Blister Comorbid History: Cataracts, Hypertension, Type II Diabetes, Osteoarthritis Date Acquired: 01/05/2022 Weeks Of Treatment: 6 Clustered Wound: Yes Photos Wound Measurements Length: (cm) Width: (cm) Depth: (cm) Clustered  Quantity: Area: (cm) Volume: (cm) 0 % Reduction in Area: 100% 0 % Reduction in Volume: 100% 0 Epithelialization: Large (67-100%) 3 Tunneling: No 0 Undermining: No 0 Wound Description Classification: Full Thickness Without Exposed Support Structures Exudate Amount: None Present Foul Odor After Cleansing: No Slough/Fibrino No Wound Bed Granulation Amount: None Present (0%) Exposed Structure Necrotic Amount: None Present (0%) Fascia Exposed: No Fat Layer (Subcutaneous Tissue) Exposed: Yes Tendon Exposed: No Muscle Exposed: No Joint Exposed: No Bone Exposed: No Periwound Skin Texture Texture Color No  Abnormalities Noted: Yes No Abnormalities Noted: No Hemosiderin Staining: Yes Moisture No Abnormalities Noted: No Temperature / Pain Dry / Scaly: Yes Temperature: No Abnormality Electronic Signature(s) Signed: 06/15/2022 2:56:12 PM By: Baruch Gouty RN, BSN Entered By: Baruch Gouty on 06/15/2022 10:09:51 [name] ([id number]) [id number] Page 7 of 7 -------------------------------------------------------------------------------- Vitals Details Patient Name: Date of Service: [facility], [name] 06/15/2022 9:45 A M Medical Record Number: [id number] Patient Account Number: [id number] Date of Birth/Sex: Treating RN: 12/18/1932 (89 y.o. [name] Primary Care Anjel Pardo: Dellis Filbert Other Clinician: Referring Peola Joynt: Treating Bindu Docter/Extender: [name] in Treatment: 6 Vital Signs Time Taken: 09:55 Temperature (F): 98.4 Height (in): 60 Pulse (bpm): 80 Weight (lbs): 165 Respiratory Rate (breaths/min): 18 Body Mass Index (BMI): 32.2 Blood Pressure (mmHg): 147/79 Capillary Blood Glucose (mg/dl): 131 Reference Range: 80 - 120 mg / dl Notes glucose per pt report this am Electronic Signature(s) Signed: 06/15/2022 2:56:12 PM By: Baruch Gouty RN, BSN Entered By: Baruch Gouty on 06/15/2022 09:56:00

## 2022-06-16 NOTE — Progress Notes (Signed)
LANIA, WENDLANDT ([id number]) [id number] Page 1 of 6 Visit Report for 06/15/2022 Chief Complaint Document Details Patient Name: Date of Service: [facility] [name], [name] 06/15/2022 9:45 A M Medical Record Number: [id number] Patient Account Number: [id number] Date of Birth/Sex: Treating RN: May 31, 1932 (89 y.o. F) Primary Care Provider: Dellis Filbert Other Clinician: Referring Provider: Treating Provider/Extender: [name] in Treatment: 6 Information Obtained from: Patient Chief Complaint Patient presents for treatment of multiple open ulcers due to suspected venous insufficiency and a gluteal pressure ulcer Electronic Signature(s) Signed: 06/15/2022 10:39:22 AM By: Fredirick Maudlin MD FACS Entered By: Fredirick Maudlin on 06/15/2022 10:39:22 -------------------------------------------------------------------------------- HPI Details Patient Name: Date of Service: [name], [name] 06/15/2022 9:45 A M Medical Record Number: [id number] Patient Account Number: [id number] Date of Birth/Sex: Treating RN: 1932-07-02 (89 y.o. F) Primary Care Provider: Dellis Filbert Other Clinician: Referring Provider: Treating Provider/Extender: [name] in Treatment: 6 History of Present Illness HPI Description: ADMISSION 04/29/2022 This is an 89 year old type II diabetic (no hemoglobin A1c available for review) who also has hypertension, neuropathy, peripheral angiopathy, chronic kidney disease, among other medical comorbidities. Her history is somewhat difficult to put together as she is extremely hard of hearing and according to her son, who accompanies her today, she does not reliably see physicians. There are apparently some concerns for self-neglect, but the family dynamics are complicated. She has 2+ pitting edema bilaterally with bilateral erythema suggestive of stasis dermatitis. What little outside information was sent  ahead indicates that she was thought to potentially have cellulitis and is she is currently taking Keflex for this. She also has open wounds on her right lower extremity on both the anterior, posterior, and medial aspects. They vary in depth but all have slough and nonviable subcutaneous tissue present. She has a small pressure ulcer on her left gluteus, just adjacent to the natal cleft. 05/06/2022: The anterior lower leg wound is healed. The posterior and medial lower leg wounds have accumulated a substantial amount of slough. Edema control bilaterally is markedly improved from last week. The pressure ulcer on her left buttock is smaller and quite clean with just a small amount of slough and eschar present. 05/14/2022: The gluteal ulcer is healed. The ulcers on her posterior and medial lower leg are cleaner but still with a fair amount of slough buildup. They are less tender than on prior visits. Edema control bilaterally is good. 05/24/2022: The ulcers on her right lower leg are smaller and cleaner again today. Significantly less slough accumulation. Edema control is good. 06/04/2022: All of her wounds are nearly healed. The remaining open areas are small and superficial with just a little slough and eschar present. Edema control is excellent. 06/15/2022: Her wounds are healed. Electronic Signature(s) Signed: 06/15/2022 10:39:36 AM By: Fredirick Maudlin MD FACS Entered By: Fredirick Maudlin on 06/15/2022 10:39:36 -------------------------------------------------------------------------------- Physical Exam Details Patient Name: Date of Service: [name], [name] 06/15/2022 9:45 A M Medical Record Number: [id number] Patient Account Number: [id number] Date of Birth/Sex: Treating RN: 1932-07-21 (89 y.o. F) Primary Care Provider: Dellis Filbert Other Clinician: Referring Provider: Treating Provider/Extender: Marshae, Shrawder, Isatu B ([id number])  [id number] Page 2 of 6 Weeks in Treatment: 6 Constitutional Slightly hypertensive. . . . no acute distress. Respiratory Normal work of breathing on room air. Notes 06/15/2022: Her wounds are healed. Electronic Signature(s) Signed: 06/15/2022 10:41:26 AM By: Fredirick Maudlin MD FACS Entered By: Fredirick Maudlin on  06/15/2022 10:41:26 -------------------------------------------------------------------------------- Physician Orders Details Patient Name: Date of Service: [facility] [name], [name] TTIE B. 06/15/2022 9:45 A M Medical Record Number: [id number] Patient Account Number: [id number] Date of Birth/Sex: Treating RN: 1932/12/10 (89 y.o. Elam Dutch Primary Care Provider: Dellis Filbert Other Clinician: Referring Provider: Treating Provider/Extender: [name] in Treatment: 6 Verbal / Phone Orders: No Diagnosis Coding ICD-10 Coding Code Description [phone] 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 E11.622 Type 2 diabetes mellitus with other skin ulcer E11.40 Type 2 diabetes mellitus with diabetic neuropathy, unspecified I73.9 Peripheral vascular disease, unspecified H91.93 Unspecified hearing loss, bilateral R60.0 Localized edema Discharge From [facility] Discharge from [place] Bathing/ Shower/ Hygiene May shower and wash wound with soap and water. Edema Control - Lymphedema / SCD / Other Elevate legs to the level of the heart or above for 30 minutes daily and/or when sitting for 3-4 times a day throughout the day. Avoid standing for long periods of time. Exercise regularly Moisturize legs daily. Compression stocking or Garment 10-20 mm/Hg pressure to: - support hose both legs daily Electronic Signature(s) Signed: 06/15/2022 10:41:36 AM By: Fredirick Maudlin MD FACS Entered By: Fredirick Maudlin on 06/15/2022  10:41:36 -------------------------------------------------------------------------------- Problem List Details Patient Name: Date of Service: [name], [name] 06/15/2022 9:45 A M Medical Record Number: [id number] Patient Account Number: [id number] Date of Birth/Sex: Treating RN: 08/26/1932 (89 y.o. Elam Dutch Primary Care Provider: Dellis Filbert Other Clinician: Referring Provider: Treating Provider/Extender: [name] in Treatment: [address] 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 04/29/2022 No Yes E11.622 Type 2 diabetes mellitus with other skin ulcer 04/29/2022 No Yes E11.40 Type 2 diabetes mellitus with diabetic neuropathy, unspecified 04/29/2022 No Yes I73.9 Peripheral vascular disease, unspecified 04/29/2022 No Yes H91.93 Unspecified hearing loss, bilateral 04/29/2022 No Yes R60.0 Localized edema 04/29/2022 No Yes Inactive Problems ICD-10 Code Description Active Date Inactive Date L89.322 Pressure ulcer of left buttock, stage 2 04/29/2022 04/29/2022 Resolved Problems Electronic Signature(s) Signed: 06/15/2022 10:39:10 AM By: Fredirick Maudlin MD FACS Entered By: Fredirick Maudlin on 06/15/2022 10:39:10 -------------------------------------------------------------------------------- Progress Note Details Patient Name: Date of Service: [name], [name] 06/15/2022 9:45 A M Medical Record Number: [id number] Patient Account Number: [id number] Date of Birth/Sex: Treating RN: 02-Nov-1932 (89 y.o. F) Primary Care Provider: Dellis Filbert Other Clinician: Referring Provider: Treating Provider/Extender: [name] in Treatment: 6 Subjective Chief  Complaint Information obtained from Patient Patient presents for treatment of multiple open ulcers due to suspected venous insufficiency and a gluteal pressure ulcer History of Present Illness (HPI) ADMISSION 04/29/2022 This is an 89 year old type II diabetic (no hemoglobin A1c available for review) who also has hypertension, neuropathy, peripheral angiopathy, chronic kidney disease, among other medical comorbidities. Her history is somewhat difficult to put together as she is extremely hard of hearing and according to her son, who accompanies her today, she does not reliably see physicians. There are apparently some concerns for self-neglect, but the family dynamics are complicated. She has 2+ pitting edema bilaterally with bilateral erythema suggestive of stasis dermatitis. What little outside information was sent ahead  indicates that she was thought to potentially have cellulitis and is she is currently taking Keflex for this. She also has open wounds on her right lower extremity on both the anterior, posterior, and medial aspects. They vary in depth but all have slough and nonviable subcutaneous tissue present. She has a small pressure ulcer on her left gluteus, just adjacent to the natal cleft. PATERICIA, COLOMBE ([id number]) [id number] Page 4 of 6 05/06/2022: The anterior lower leg wound is healed. The posterior and medial lower leg wounds have accumulated a substantial amount of slough. Edema control bilaterally is markedly improved from last week. The pressure ulcer on her left buttock is smaller and quite clean with just a small amount of slough and eschar present. 05/14/2022: The gluteal ulcer is healed. The ulcers on her posterior and medial lower leg are cleaner but still with a fair amount of slough buildup. They are less tender than on prior visits. Edema control bilaterally is good. 05/24/2022: The ulcers on her right lower leg are smaller and cleaner again  today. Significantly less slough accumulation. Edema control is good. 06/04/2022: All of her wounds are nearly healed. The remaining open areas are small and superficial with just a little slough and eschar present. Edema control is excellent. 06/15/2022: Her wounds are healed. Patient History Information obtained from Patient. Social History Never smoker, Marital Status - Widowed, Alcohol Use - Never, Drug Use - No History, Caffeine Use - Never. Medical History Eyes Patient has history of Cataracts Cardiovascular Patient has history of Hypertension Endocrine Patient has history of Type II Diabetes Musculoskeletal Patient has history of Osteoarthritis Hospitalization/Surgery History - cataract extraction. - rectocele repair. - abdominal hysterectomy. - breast surgery. - c-eye surgery procedure. - eye surgery. Medical A Surgical History Notes nd Eyes diabetic retinopathy, macular degeneration Objective Constitutional Slightly hypertensive. no acute distress. Vitals Time Taken: 9:55 AM, Height: 60 in, Weight: 165 lbs, BMI: 32.2, Temperature: 98.4 F, Pulse: 80 bpm, Respiratory Rate: 18 breaths/min, Blood Pressure: 147/79 mmHg, Capillary Blood Glucose: 131 mg/dl. General Notes: glucose per pt report this am Respiratory Normal work of breathing on room air. General Notes: 06/15/2022: Her wounds are healed. Integumentary (Hair, Skin) Wound #1 status is Open. Original cause of wound was Blister. The date acquired was: 01/05/2022. The wound has been in treatment 6 weeks. The wound is located on the Right,Posterior Lower Leg. The wound measures 0cm length x 0cm width x 0cm depth; 0cm^2 area and 0cm^3 volume. There is Fat Layer (Subcutaneous Tissue) exposed. There is no tunneling or undermining noted. There is a none present amount of drainage noted. There is no granulation within the wound bed. There is no necrotic tissue within the wound bed. The periwound skin appearance had no  abnormalities noted for texture. The periwound skin appearance exhibited: Dry/Scaly, Hemosiderin Staining. Periwound temperature was noted as No Abnormality. 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 Edema Control - Lymphedema / SCD / Other: Elevate legs to the level of the heart or above for 30 minutes daily and/or when sitting for  3-4 times a day throughout the day. Avoid standing for long periods of time. Exercise regularly Moisturize legs daily. Compression stocking or Garment 10-20 mm/Hg pressure to: - support hose both legs daily 06/15/2022: Her wounds are healed. Her edema control has been excellent while in her compression wraps and the patient even states that her legs feel really good today. Her granddaughter is accompanying her today and will take her to purchase compression stockings after they complete their visit. She was advised to wear her compression stockings on a daily basis from the time she gets up in the morning until she goes to bed at night and to keep her legs elevated throughout the day and at night while she is sleeping. We will discharge her from the wound care center and she may follow-up as needed. Electronic Signature(s) Signed: 06/15/2022 10:42:39 AM By: Fredirick Maudlin MD FACS Entered By: Fredirick Maudlin on 06/15/2022  10:42:39 -------------------------------------------------------------------------------- HxROS Details Patient Name: Date of Service: [name], [name] 06/15/2022 9:45 A M Medical Record Number: [id number] Patient Account Number: [id number] Date of Birth/Sex: Treating RN: 1932/10/21 (89 y.o. F) Primary Care Provider: Dellis Filbert Other Clinician: Referring Provider: Treating Provider/Extender: [name] in Treatment: 6 Information Obtained From Patient Eyes Medical History: Positive for: Cataracts Past Medical History Notes: diabetic retinopathy, macular degeneration Cardiovascular Medical History: Positive for: Hypertension Endocrine Medical History: Positive for: Type II Diabetes Time with diabetes: 17 yrs Treated with: Insulin Blood sugar tested every day: Yes Tested : Musculoskeletal Medical History: Positive for: Osteoarthritis HBO Extended History Items Eyes: Cataracts Immunizations Pneumococcal Vaccine: Received Pneumococcal Vaccination: No Implantable Devices CAYTON, MESSANO B ([id number]) [id number] Page 6 of 6 None Hospitalization / Surgery History Type of Hospitalization/Surgery cataract extraction rectocele repair abdominal hysterectomy breast surgery c-eye surgery procedure eye surgery Family and Social History Never smoker; Marital Status - Widowed; Alcohol Use: Never; Drug Use: No History; Caffeine Use: Never; Financial Concerns: No; Food, Clothing or Shelter Needs: No; Support System Lacking: No; Transportation Concerns: No Electronic Signature(s) Signed: 06/15/2022 11:31:02 AM By: Fredirick Maudlin MD FACS Entered By: Fredirick Maudlin on 06/15/2022 10:39:42 -------------------------------------------------------------------------------- SuperBill Details Patient Name: Date of Service: Letitia Neri, [place] [name] 06/15/2022 Medical Record Number: [id number] Patient Account Number: [id number] Date of  Birth/Sex: Treating RN: 02/23/1933 (89 y.o. 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 E11.622 Type 2 diabetes mellitus with other skin ulcer E11.40 Type 2 diabetes mellitus with diabetic neuropathy, unspecified I73.9 Peripheral vascular disease, unspecified H91.93 Unspecified hearing loss, bilateral R60.0 Localized edema Facility Procedures : CPT4 Code: [id number] Description: 99213 - WOUND CARE VISIT-LEV 3 EST PT Modifier: Quantity: 1 Physician Procedures : CPT4 Code Description Modifier [id number] - WC PHYS LEVEL 3 - EST PT ICD-10 Diagnosis Description [id number] Non-pressure chronic 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 L97.212 Non-pressure chronic ulcer of right calf with fat  layer exposed R60.0 Localized edema E11.622 Type 2 diabetes mellitus with other skin ulcer Quantity: 1 Electronic Signature(s) Signed: 06/15/2022 10:43:15 AM By: Fredirick Maudlin MD FACS Entered By: Fredirick Maudlin on 06/15/2022 10:43:15

## 2022-06-17 DIAGNOSIS — E1139 Type 2 diabetes mellitus with other diabetic ophthalmic complication: Secondary | ICD-10-CM | POA: Diagnosis not present

## 2022-06-17 DIAGNOSIS — E785 Hyperlipidemia, unspecified: Secondary | ICD-10-CM | POA: Diagnosis not present

## 2022-08-03 DIAGNOSIS — L110 Acquired keratosis follicularis: Secondary | ICD-10-CM | POA: Diagnosis not present

## 2022-08-03 DIAGNOSIS — L89893 Pressure ulcer of other site, stage 3: Secondary | ICD-10-CM | POA: Diagnosis not present

## 2022-08-03 DIAGNOSIS — M79674 Pain in right toe(s): Secondary | ICD-10-CM | POA: Diagnosis not present

## 2022-08-03 DIAGNOSIS — M79671 Pain in right foot: Secondary | ICD-10-CM | POA: Diagnosis not present

## 2022-08-03 DIAGNOSIS — E1140 Type 2 diabetes mellitus with diabetic neuropathy, unspecified: Secondary | ICD-10-CM | POA: Diagnosis not present

## 2022-08-03 DIAGNOSIS — I739 Peripheral vascular disease, unspecified: Secondary | ICD-10-CM | POA: Diagnosis not present

## 2022-08-24 DIAGNOSIS — L89893 Pressure ulcer of other site, stage 3: Secondary | ICD-10-CM | POA: Diagnosis not present

## 2022-08-24 DIAGNOSIS — E1140 Type 2 diabetes mellitus with diabetic neuropathy, unspecified: Secondary | ICD-10-CM | POA: Diagnosis not present

## 2022-08-24 DIAGNOSIS — M79671 Pain in right foot: Secondary | ICD-10-CM | POA: Diagnosis not present

## 2022-08-24 DIAGNOSIS — M79674 Pain in right toe(s): Secondary | ICD-10-CM | POA: Diagnosis not present

## 2022-08-30 ENCOUNTER — Encounter (HOSPITAL_BASED_OUTPATIENT_CLINIC_OR_DEPARTMENT_OTHER): Payer: Medicare Other | Admitting: General Surgery

## 2022-09-07 DIAGNOSIS — M79674 Pain in right toe(s): Secondary | ICD-10-CM | POA: Diagnosis not present

## 2022-09-07 DIAGNOSIS — L110 Acquired keratosis follicularis: Secondary | ICD-10-CM | POA: Diagnosis not present

## 2022-09-07 DIAGNOSIS — E1140 Type 2 diabetes mellitus with diabetic neuropathy, unspecified: Secondary | ICD-10-CM | POA: Diagnosis not present

## 2022-09-07 DIAGNOSIS — L89893 Pressure ulcer of other site, stage 3: Secondary | ICD-10-CM | POA: Diagnosis not present

## 2022-09-07 DIAGNOSIS — M79671 Pain in right foot: Secondary | ICD-10-CM | POA: Diagnosis not present

## 2022-09-10 ENCOUNTER — Encounter (HOSPITAL_BASED_OUTPATIENT_CLINIC_OR_DEPARTMENT_OTHER): Payer: Medicare Other | Attending: General Surgery | Admitting: General Surgery

## 2022-09-10 DIAGNOSIS — I872 Venous insufficiency (chronic) (peripheral): Secondary | ICD-10-CM | POA: Insufficient documentation

## 2022-09-10 DIAGNOSIS — L89613 Pressure ulcer of right heel, stage 3: Secondary | ICD-10-CM | POA: Insufficient documentation

## 2022-09-10 DIAGNOSIS — E11622 Type 2 diabetes mellitus with other skin ulcer: Secondary | ICD-10-CM | POA: Insufficient documentation

## 2022-09-10 DIAGNOSIS — L97412 Non-pressure chronic ulcer of right heel and midfoot with fat layer exposed: Secondary | ICD-10-CM | POA: Diagnosis not present

## 2022-09-10 DIAGNOSIS — E11621 Type 2 diabetes mellitus with foot ulcer: Secondary | ICD-10-CM | POA: Diagnosis not present

## 2022-09-10 DIAGNOSIS — L97812 Non-pressure chronic 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: Secondary | ICD-10-CM | POA: Diagnosis not present

## 2022-09-11 NOTE — Progress Notes (Signed)
EASTYNN, MATOS ([id number]) [id number] Page 1 of 4 Visit Report for 09/10/2022 Abuse Risk Screen Details Patient Name: Date of Service: [name], [name] 09/10/2022 1:15 PM Medical Record Number: [id number] Patient Account Number: [id number] Date of Birth/Sex: Treating RN: [birth year] (89 y.o. [name] Primary Care Sharena Dibenedetto: [place] Mon Other Clinician: Referring Sasan Wilkie: Treating Amyria Komar/Extender: [name] in Treatment: 0 Abuse Risk Screen Items Answer ABUSE RISK SCREEN: Has anyone close to you tried to hurt or harm you recentlyo No Do you feel uncomfortable with anyone in your familyo No Has anyone forced you do things that you didnt want to doo No Electronic Signature(s) Signed: 09/10/2022 2:56:55 PM By: [name] Entered By: Karie Schwalbe on 09/10/2022 13:31:57 -------------------------------------------------------------------------------- Activities of Daily Living Details Patient Name: Date of Service: [name], [name] 09/10/2022 1:15 PM Medical Record Number: [id number] Patient Account Number: [id number] Date of Birth/Sex: Treating RN: 05-26-1932 (89 y.o. [name] Primary Care Keonta Alsip: [place] Mon Other Clinician: Referring Shainna Faux: Treating Addisynn Vassell/Extender: [name] in Treatment: 0 Activities of Daily Living Items Answer Activities of Daily Living (Please select one for each item) Drive Automobile Not Able T Medications ake Need Assistance Use T elephone Need Assistance Care for Appearance Need Assistance Use T oilet Need Assistance Bath / Shower Need Assistance Dress Self Need Assistance Feed Self Need Assistance Walk Need Assistance Get In / Out Bed Need Assistance Housework Need Assistance Prepare Meals Need Assistance Handle Money Need Assistance Shop for Self Need Assistance Electronic Signature(s) Signed: 09/10/2022 2:56:55 PM  By: [name] Entered By: Karie Schwalbe on 09/10/2022 13:33:32 -------------------------------------------------------------------------------- Education Screening Details Patient Name: Date of Service: [name], [name] 09/10/2022 1:15 PM Medical Record Number: [id number] Patient Account Number: [id number] Date of Birth/Sex: Treating RN: 02-Jun-1932 (89 y.o. [name] Primary Care Aleksa Collinsworth: [place] Mon Other Clinician: Referring Carmelite Violet: Treating Dashel Goines/Extender: [name] in Treatment: [address] [name] ([id number]) [id number] Page 2 of 4 Primary Learner Assessed: Patient Learning Preferences/Education Level/Primary Language Learning Preference: Explanation, Demonstration Highest Education Level: High School Preferred Language: English Cognitive Barrier Language Barrier: No Translator Needed: No Memory Deficit: No Emotional Barrier: No Cultural/Religious Beliefs Affecting Medical Care: No Physical Barrier Impaired Vision: Yes Reduced vision Impaired Hearing: Yes Hard of hearing Decreased Hand dexterity: No Knowledge/Comprehension Knowledge Level: High Comprehension Level: High Ability to understand written instructions: High Ability to understand verbal instructions: High Motivation Anxiety Level: Calm Cooperation: Cooperative Education Importance: Acknowledges Need Interest in Health Problems: Asks Questions Perception: Coherent Willingness to Engage in Self-Management High Activities: Readiness to Engage in Self-Management High Activities: Electronic Signature(s) Signed: 09/10/2022 2:56:55 PM By: [name] Entered By: Karie Schwalbe on 09/10/2022 13:34:43 -------------------------------------------------------------------------------- Fall Risk Assessment Details Patient Name: Date of Service: [name] 09/10/2022 1:15 PM Medical Record Number: [id number] Patient  Account Number: [id number] Date of Birth/Sex: Treating RN: [birth year] (89 y.o. [name] Primary Care Rosland Riding: [place] Mon Other Clinician: Referring Saraiyah Hemminger: Treating Malachai Schalk/Extender: [name] in Treatment: 0 Fall Risk Assessment Items Have you had 2 or more falls in the last 12 monthso 0 No Have you had any fall that resulted in injury in the last 12 monthso 0 No FALLS RISK SCREEN History of falling - immediate or within 3 months 0 No Secondary diagnosis (Do you have 2 or more medical diagnoseso) 0 No Ambulatory  aid None/bed rest/wheelchair/nurse 0 No Crutches/cane/walker 0 No Furniture 0 No Intravenous therapy Access/Saline/Heparin Lock 0 No Gait/Transferring Normal/ bed rest/ wheelchair 0 No Weak (short steps with or without shuffle, stooped but able to lift head while walking, may seek 0 No support from furniture) Impaired (short steps with shuffle, may have difficulty arising from chair, head down, impaired 0 No balance) Mental Status Oriented to own ability 0 No Overestimates or forgets limitations 0 No Risk Level: Low Risk Score: 0 [name], [name] ([id number]) [phone] [id number] Page 3 of 4 Electronic Signature(s) -------------------------------------------------------------------------------- Foot Assessment Details Patient Name: Date of Service: [name], [name] 09/10/2022 1:15 PM Medical Record Number: [id number] Patient Account Number: [id number] Date of Birth/Sex: Treating RN: 10-Aug-1932 (89 y.o. [name] Primary Care Jaedin Regina: [place] Mon Other Clinician: Referring Khalel Alms: Treating Marvel Mcphillips/Extender: [name] in Treatment: 0 Foot Assessment Items Site Locations + = Sensation present, - = Sensation absent, C = Callus, U = Ulcer R = Redness, W = Warmth, M = Maceration, PU = Pre-ulcerative lesion F = Fissure, S = Swelling, D = Dryness Assessment Right:  Left: Other Deformity: No No Prior Foot Ulcer: No No Prior Amputation: No No Charcot Joint: No No Ambulatory Status: Ambulatory With Help Assistance Device: Wheelchair Gait: Steady Electronic Signature(s) Signed: 09/10/2022 2:56:55 PM By: [name] Entered By: Karie Schwalbe on 09/10/2022 13:39:59 -------------------------------------------------------------------------------- Nutrition Risk Screening Details Patient Name: Date of Service: [place] [name], [name] 09/10/2022 1:15 PM Medical Record Number: [id number] Patient Account Number: [id number] Date of Birth/Sex: Treating RN: 03-03-1933 (89 y.o. [name] Primary Care Alyssa Rotondo: [place] Mon Other Clinician: Referring Raianna Slight: Treating Elyssa Pendelton/Extender: Eloy End Weeks in Treatment: 0 Height (in): 65 Weight (lbs): 140 Body Mass Index (BMI): 23.3 [name], [name] ([id number]) [phone] [id number] Page 4 of 4 Nutrition Risk Screening Items Score Screening NUTRITION RISK SCREEN: I have an illness or condition that made me change the kind and/or amount of food I eat 0 No I eat fewer than two meals per day 0 No I eat few fruits and vegetables, or milk products 0 No I have three or more drinks of beer, liquor or wine almost every day 0 No I have tooth or mouth problems that make it hard for me to eat 0 No I don't always have enough money to buy the food I need 0 No I eat alone most of the time 0 No I take three or more different prescribed or over-the-counter drugs a day 0 No Without wanting to, I have lost or gained 10 pounds in the last six months 0 No I am not always physically able to shop, cook and/or feed myself 0 No Nutrition Protocols Good Risk Protocol 0 No interventions needed Moderate Risk Protocol High Risk Proctocol Risk Level: Good Risk Score: 0 Electronic Signature(s) Signed: 09/10/2022 2:56:55 PM By: [name] Entered By: Karie Schwalbe on  09/10/2022 13:36:38

## 2022-09-17 ENCOUNTER — Encounter (HOSPITAL_BASED_OUTPATIENT_CLINIC_OR_DEPARTMENT_OTHER): Payer: Medicare Other | Admitting: General Surgery

## 2022-09-17 DIAGNOSIS — I872 Venous insufficiency (chronic) (peripheral): Secondary | ICD-10-CM | POA: Diagnosis not present

## 2022-09-17 DIAGNOSIS — L89613 Pressure ulcer of right heel, stage 3: Secondary | ICD-10-CM | POA: Diagnosis not present

## 2022-09-17 DIAGNOSIS — L97812 Non-pressure chronic 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: Secondary | ICD-10-CM | POA: Diagnosis not present

## 2022-09-17 DIAGNOSIS — E11622 Type 2 diabetes mellitus with other skin ulcer: Secondary | ICD-10-CM | POA: Diagnosis not present

## 2022-09-17 DIAGNOSIS — E11621 Type 2 diabetes mellitus with foot ulcer: Secondary | ICD-10-CM | POA: Diagnosis not present

## 2022-09-17 DIAGNOSIS — L97412 Non-pressure chronic ulcer of right heel and midfoot with fat layer exposed: Secondary | ICD-10-CM | POA: Diagnosis not present

## 2022-09-24 ENCOUNTER — Encounter (HOSPITAL_BASED_OUTPATIENT_CLINIC_OR_DEPARTMENT_OTHER): Payer: Medicare Other | Attending: General Surgery | Admitting: Internal Medicine

## 2022-09-24 DIAGNOSIS — L97811 Non-pressure chronic ulcer of other part of right lower leg limited to breakdown of skin: Secondary | ICD-10-CM | POA: Diagnosis not present

## 2022-09-24 DIAGNOSIS — E1122 Type 2 diabetes mellitus with diabetic chronic kidney disease: Secondary | ICD-10-CM | POA: Insufficient documentation

## 2022-09-24 DIAGNOSIS — L89613 Pressure ulcer of right heel, stage 3: Secondary | ICD-10-CM | POA: Diagnosis not present

## 2022-09-24 DIAGNOSIS — E11319 Type 2 diabetes mellitus with unspecified diabetic retinopathy without macular edema: Secondary | ICD-10-CM | POA: Insufficient documentation

## 2022-09-24 DIAGNOSIS — N189 Chronic kidney disease, unspecified: Secondary | ICD-10-CM | POA: Insufficient documentation

## 2022-09-24 DIAGNOSIS — E1140 Type 2 diabetes mellitus with diabetic neuropathy, unspecified: Secondary | ICD-10-CM | POA: Diagnosis not present

## 2022-09-24 DIAGNOSIS — E11621 Type 2 diabetes mellitus with foot ulcer: Secondary | ICD-10-CM | POA: Insufficient documentation

## 2022-09-24 DIAGNOSIS — I129 Hypertensive chronic kidney disease with stage 1 through stage 4 chronic kidney disease, or unspecified chronic kidney disease: Secondary | ICD-10-CM | POA: Diagnosis not present

## 2022-09-24 DIAGNOSIS — E1151 Type 2 diabetes mellitus with diabetic peripheral angiopathy without gangrene: Secondary | ICD-10-CM | POA: Diagnosis not present

## 2022-09-24 DIAGNOSIS — I872 Venous insufficiency (chronic) (peripheral): Secondary | ICD-10-CM | POA: Insufficient documentation

## 2022-09-24 DIAGNOSIS — M1990 Unspecified osteoarthritis, unspecified site: Secondary | ICD-10-CM | POA: Insufficient documentation

## 2022-09-24 DIAGNOSIS — L03115 Cellulitis of right lower limb: Secondary | ICD-10-CM | POA: Insufficient documentation

## 2022-09-24 DIAGNOSIS — E1169 Type 2 diabetes mellitus with other specified complication: Secondary | ICD-10-CM | POA: Diagnosis not present

## 2022-09-24 DIAGNOSIS — I10 Essential (primary) hypertension: Secondary | ICD-10-CM | POA: Diagnosis not present

## 2022-09-28 ENCOUNTER — Encounter (HOSPITAL_BASED_OUTPATIENT_CLINIC_OR_DEPARTMENT_OTHER): Payer: Medicare Other | Admitting: General Surgery

## 2022-09-28 DIAGNOSIS — E1151 Type 2 diabetes mellitus with diabetic peripheral angiopathy without gangrene: Secondary | ICD-10-CM | POA: Diagnosis not present

## 2022-09-28 DIAGNOSIS — E1122 Type 2 diabetes mellitus with diabetic chronic kidney disease: Secondary | ICD-10-CM | POA: Diagnosis not present

## 2022-09-28 DIAGNOSIS — E11621 Type 2 diabetes mellitus with foot ulcer: Secondary | ICD-10-CM | POA: Diagnosis not present

## 2022-09-28 DIAGNOSIS — L03115 Cellulitis of right lower limb: Secondary | ICD-10-CM | POA: Diagnosis not present

## 2022-09-28 DIAGNOSIS — I129 Hypertensive chronic kidney disease with stage 1 through stage 4 chronic kidney disease, or unspecified chronic kidney disease: Secondary | ICD-10-CM | POA: Diagnosis not present

## 2022-09-28 DIAGNOSIS — M1990 Unspecified osteoarthritis, unspecified site: Secondary | ICD-10-CM | POA: Diagnosis not present

## 2022-09-28 DIAGNOSIS — L97811 Non-pressure chronic ulcer of other part of right lower leg limited to breakdown of skin: Secondary | ICD-10-CM | POA: Diagnosis not present

## 2022-09-28 DIAGNOSIS — N189 Chronic kidney disease, unspecified: Secondary | ICD-10-CM | POA: Diagnosis not present

## 2022-09-28 DIAGNOSIS — E11319 Type 2 diabetes mellitus with unspecified diabetic retinopathy without macular edema: Secondary | ICD-10-CM | POA: Diagnosis not present

## 2022-09-28 DIAGNOSIS — E1140 Type 2 diabetes mellitus with diabetic neuropathy, unspecified: Secondary | ICD-10-CM | POA: Diagnosis not present

## 2022-09-28 DIAGNOSIS — I10 Essential (primary) hypertension: Secondary | ICD-10-CM | POA: Diagnosis not present

## 2022-09-28 DIAGNOSIS — L89613 Pressure ulcer of right heel, stage 3: Secondary | ICD-10-CM | POA: Diagnosis not present

## 2022-09-28 DIAGNOSIS — I872 Venous insufficiency (chronic) (peripheral): Secondary | ICD-10-CM | POA: Diagnosis not present

## 2022-09-29 NOTE — Progress Notes (Signed)
Kimberly, [name] ([id number]) [id number] Page 1 of 6 Visit Report for 09/24/2022 Arrival Information Details Patient Name: Date of Service: [name], [name] 09/24/2022 3:15 PM Medical Record Number: [id number] Patient Account Number: [id number] Date of Birth/Sex: Treating RN: 1932-10-07 (89 y.o. [name] Primary Care Kimberly Jarmon: [place] [name] Other Clinician: Referring Walterine Amodei: Treating Bladyn Tipps/Extender: [name] in Treatment: 2 Visit Information History Since Last Visit All ordered tests and consults were completed: Yes Patient Arrived: Wheel Chair Added or deleted any medications: No Arrival Time: 14:57 Any new allergies or adverse reactions: No Accompanied By: daughter Had a fall or experienced change in No Transfer Assistance: None activities of daily living that may affect Patient Identification Verified: Yes risk of falls: Secondary Verification Process Completed: Yes Signs or symptoms of abuse/neglect since last visito No Patient Requires Transmission-Based Precautions: No Hospitalized since last visit: No Patient Has Alerts: Yes Implantable device outside of the clinic excluding No Patient Alerts: ABI R 0.87 cellular tissue based products placed in the center since last visit: Has Dressing in Place as Prescribed: Yes Pain Present Now: No Electronic Signature(s) Signed: 09/24/2022 4:38:17 PM By: Zenaida Deed RN, BSN Entered By: Zenaida Deed on 09/24/2022 14:58:49 -------------------------------------------------------------------------------- Compression Therapy Details Patient Name: Date of Service: [name] [name], Kimberly [facility]. 09/24/2022 3:15 PM Medical Record Number: [id number] Patient Account Number: [id number] Date of Birth/Sex: Treating RN: August 02, 1932 (89 y.o. [name] Primary Care [name]: [name] Other Clinician: Referring Terasa Orsini: Treating Deziya Amero/Extender: [name] in Treatment: 2 Compression Therapy Performed for Wound Assessment: Wound #4 Right Calcaneus Performed By: Clinician Zenaida Deed, RN Compression Type: Three Layer Post Procedure Diagnosis Same as Pre-procedure Electronic Signature(s) Signed: 09/24/2022 4:38:17 PM By: Zenaida Deed RN, BSN Entered By: Zenaida Deed on 09/24/2022 15:36:09 -------------------------------------------------------------------------------- Encounter Discharge Information Details Patient Name: Date of Service: [facility] [name], Kimberly [facility] B. 09/24/2022 3:15 PM Medical Record Number: [id number] Patient Account Number: [id number] Date of Birth/Sex: Treating RN: 05/21/1932 (89 y.o. [name] Primary Care [name]: [place] [name] Other Clinician: Referring [name]: Treating [name]/Extender: [name] in Treatment: 2 Encounter Discharge Information Items Discharge Condition: Stable Ambulatory Status: Ambulatory Discharge Destination: Home Transportation: [address] [place] ([id number]) [id number] Page 2 of 6 Accompanied By: daughter Schedule Follow-up Appointment: Yes Clinical Summary of Care: Patient Declined Electronic Signature(s) Signed: 09/24/2022 4:38:17 PM By: Zenaida Deed RN, BSN Entered By: Zenaida Deed on 09/24/2022 15:54:35 -------------------------------------------------------------------------------- Lower Extremity Assessment Details Patient Name: Date of Service: Kimberly [name], Kimberly TTIE B. 09/24/2022 3:15 PM Medical Record Number: [id number] Patient Account Number: [id number] Date of Birth/Sex: Treating RN: 01/01/1933 (89 y.o. [name] Primary Care [name]: [place] [name] Other Clinician: Referring Azrielle Springsteen: Treating Yug Loria/Extender: [name] in Treatment: 2 Edema Assessment Assessed: [Left: No] [Right: No] [Left: Edema] [Right: :] Calf Left: Right: Point of Measurement: 33 cm From  Medial Instep 30.5 cm Ankle Left: Right: Point of Measurement: 11 cm From Medial Instep 20.5 cm Vascular Assessment Pulses: Dorsalis Pedis Palpable: [Right:Yes] Electronic Signature(s) Signed: 09/24/2022 4:38:17 PM By: Zenaida Deed RN, BSN Entered By: Zenaida Deed on 09/24/2022 15:06:30 -------------------------------------------------------------------------------- Multi Wound Chart Details Patient Name: Date of Service: Kimberly Bouillon, Kimberly TTIE B. 09/24/2022 3:15 PM Medical Record Number: [id number] Patient Account Number: [id number] Date of Birth/Sex: Treating RN: 14-Feb-1933 (89 y.o. F) Primary Care [name]: [place] [name] Other Clinician: Referring [name]: Treating Delvonte Berenson/Extender: Loree Fee  O Weeks in Treatment: 2 Vital Signs Height(in): 65 Pulse(bpm): 69 Weight(lbs): 140 Blood Pressure(mmHg): 168/69 Body Mass Index(BMI): 23.3 Temperature(F): 99.1 Respiratory Rate(breaths/min): 18 [4:Photos:] [N/A:N/A] Right Calcaneus N/A N/A Wound Location: Pressure Injury N/A N/A Wounding Event: Diabetic Wound/Ulcer of the Lower N/A N/A Primary Etiology: Extremity Cataracts, Hypertension, Type II N/A N/A Comorbid History: Diabetes, Osteoarthritis 08/18/2022 N/A N/A Date Acquired: 2 N/A N/A Weeks of Treatment: Open N/A N/A Wound Status: No N/A N/A Wound Recurrence: 1x1.5x0.1 N/A N/A Measurements L x W x D (cm) 1.178 N/A N/A A (cm) : rea 0.118 N/A N/A Volume (cm) : 16.70% N/A N/A % Reduction in A rea: 16.30% N/A N/A % Reduction in Volume: Grade 1 N/A N/A Classification: Medium N/A N/A Exudate A mount: Serosanguineous N/A N/A Exudate Type: red, brown N/A N/A Exudate Color: Distinct, outline attached N/A N/A Wound Margin: Large (67-100%) N/A N/A Granulation A mount: Red N/A N/A Granulation Quality: Small (1-33%) N/A N/A Necrotic A mount: Fat Layer (Subcutaneous Tissue): Yes N/A N/A Exposed Structures: Fascia: No Tendon: No Muscle:  No Joint: No Bone: No Small (1-33%) N/A N/A Epithelialization: No Abnormalities Noted N/A N/A Periwound Skin Texture: No Abnormalities Noted N/A N/A Periwound Skin Moisture: No Abnormalities Noted N/A N/A Periwound Skin Color: No Abnormality N/A N/A Temperature: Compression Therapy N/A N/A Procedures Performed: Treatment Notes Electronic Signature(s) Signed: 09/24/2022 4:34:22 PM By: Baltazar Najjar MD Entered By: Baltazar Najjar on 09/24/2022 15:40:58 -------------------------------------------------------------------------------- Multi-Disciplinary Care Plan Details Patient Name: Date of Service: [name] [name], Kimberly TTIE B. 09/24/2022 3:15 PM Medical Record Number: [id number] Patient Account Number: [id number] Date of Birth/Sex: Treating RN: 1933/03/22 (89 y.o. [name] Primary Care Levent Kornegay: [place] [name] Other Clinician: Referring Nazario Russom: Treating Colson Barco/Extender: [name] in Treatment: 2 Active Inactive Wound/Skin Impairment Nursing Diagnoses: Impaired tissue integrity Goals: Patient/caregiver will verbalize understanding of skin care regimen Date Initiated: 09/10/2022 Target Resolution Date: 12/18/2022 Goal Status: Active Interventions: Assess ulceration(s) every visit Treatment Activities: Skin care regimen initiated : 09/10/2022 Notes: MARSHAL, SCHRECENGOST ([id number]) [phone].pdf Page 4 of 6 Electronic Signature(s) Signed: 09/24/2022 4:38:17 PM By: Zenaida Deed RN, BSN Entered By: Zenaida Deed on 09/24/2022 15:08:22 -------------------------------------------------------------------------------- Pain Assessment Details Patient Name: Date of Service: [place] [name], Kimberly TTIE B. 09/24/2022 3:15 PM Medical Record Number: [id number] Patient Account Number: [id number] Date of Birth/Sex: Treating RN: Dec 29, 1932 (89 y.o. [name] Primary Care Raul Winterhalter: [place] [name] Other Clinician: Referring Makaiah Terwilliger: Treating  Sage Kopera/Extender: [name] in Treatment: 2 Active Problems Location of Pain Severity and Description of Pain Patient Has Paino No Site Locations Pain Management and Medication Current Pain Management: Electronic Signature(s) Signed: 09/24/2022 4:38:17 PM By: Zenaida Deed RN, BSN Entered By: Zenaida Deed on 09/24/2022 14:59:58 -------------------------------------------------------------------------------- Patient/Caregiver Education Details Patient Name: Date of Service: Kimberly Bouillon, [name] 6/7/2024andnbsp3:15 PM Medical Record Number: [id number] Patient Account Number: [id number] Date of Birth/Gender: Treating RN: Jul 18, 1932 (89 y.o. [name] Primary Care Physician: [place] [name] Other Clinician: Referring Physician: Treating Physician/Extender: [name] in Treatment: 2 Education Assessment Education Provided To: Patient and Caregiver Education Topics Provided Wound/Skin Impairment: Methods: Explain/Verbal Responses: State content correctly Electronic Signature(s) [name], [name] ([id number]) [id number] Page 5 of 6 Signed: 09/24/2022 4:38:17 PM By: Zenaida Deed RN, BSN Entered By: Zenaida Deed on 09/24/2022 15:08:52 -------------------------------------------------------------------------------- Wound Assessment Details Patient Name: Date of Service: Kimberly [name], Kimberly TTIE B. 09/24/2022 3:15 PM Medical Record Number: [id number] Patient Account Number: [id number] Date of  Birth/Sex: Treating RN: 04-Nov-1932 (89 y.o. F) Primary Care Rashell Shambaugh: [place] [name] Other Clinician: Referring Sharalee Witman: Treating Kennth Vanbenschoten/Extender: [name] in Treatment: 2 Wound Status Wound Number: 4 Primary Etiology: Diabetic Wound/Ulcer of the Lower Extremity Wound Location: Right Calcaneus Wound Status: Open Wounding Event: Pressure Injury Comorbid History: Cataracts, Hypertension,  Type II Diabetes, Osteoarthritis Date Acquired: 08/18/2022 Weeks Of Treatment: 2 Clustered Wound: No Photos Wound Measurements Length: (cm) 1 Width: (cm) 1.5 Depth: (cm) 0.1 Area: (cm) 1.178 Volume: (cm) 0.118 % Reduction in Area: 16.7% % Reduction in Volume: 16.3% Epithelialization: Small (1-33%) Wound Description Classification: Grade 1 Wound Margin: Distinct, outline attached Exudate Amount: Medium Exudate Type: Serosanguineous Exudate Color: red, brown Foul Odor After Cleansing: No Slough/Fibrino Yes Wound Bed Granulation Amount: Large (67-100%) Exposed Structure Granulation Quality: Red Fascia Exposed: No Necrotic Amount: Small (1-33%) Fat Layer (Subcutaneous Tissue) Exposed: Yes Necrotic Quality: Adherent Slough Tendon Exposed: No Muscle Exposed: No Joint Exposed: No Bone Exposed: No Periwound Skin Texture Texture Color No Abnormalities Noted: Yes No Abnormalities Noted: Yes Moisture Temperature / Pain No Abnormalities Noted: Yes Temperature: No Abnormality Electronic Signature(s) Signed: 09/29/2022 8:54:04 AM By: Karl Ito Entered By: Karl Ito on 09/24/2022 15:06:50 Bergman, Alfhild B ([id number]) [id number] Page 6 of 6 -------------------------------------------------------------------------------- Vitals Details Patient Name: Date of Service: [facility], [name] 09/24/2022 3:15 PM Medical Record Number: [id number] Patient Account Number: [id number] Date of Birth/Sex: Treating RN: [birth year] (89 y.o. [name] Primary Care Malena Timpone: [place] [name] Other Clinician: Referring Governor Matos: Treating Riana Tessmer/Extender: [name] in Treatment: 2 Vital Signs Time Taken: 14:58 Temperature (F): 99.1 Height (in): 65 Pulse (bpm): 69 Weight (lbs): 140 Respiratory Rate (breaths/min): 18 Body Mass Index (BMI): 23.3 Blood Pressure (mmHg): 168/69 Reference Range: 80 - 120 mg / dl Electronic  Signature(s) Signed: 09/24/2022 4:38:17 PM By: Zenaida Deed RN, BSN Entered By: Zenaida Deed on 09/24/2022 14:59:49

## 2022-09-29 NOTE — Progress Notes (Signed)
[name] ([id number]) [id number] Page 1 of 4 Visit Report for 09/28/2022 Arrival Information Details Patient Name: Date of Service: [name], [name] 09/28/2022 11:45 A [name] Medical Record Number: [id number] Patient Account Number: [id number] Date of Birth/Sex: Treating RN: [birth year] (89 y.o. [name] Primary Care [name]: [name] Other Clinician: Referring [name]: Treating [name]: [name] in Treatment: 2 Visit Information History Since Last Visit Added or deleted any medications: No Patient Arrived: Dan Humphreys Any new allergies or adverse reactions: No Arrival Time: 11:42 Had a fall or experienced change in No Accompanied By: granddaughter activities of daily living that may affect Transfer Assistance: Manual risk of falls: Patient Identification Verified: Yes Signs or symptoms of abuse/neglect since last visito No Patient Requires Transmission-Based Precautions: No Hospitalized since last visit: No Patient Has Alerts: Yes Implantable device outside of the clinic excluding No Patient Alerts: ABI R 0.87 cellular tissue based products placed in the center since last visit: Has Dressing in Place as Prescribed: Yes Has Compression in Place as Prescribed: Yes Pain Present Now: Yes Electronic Signature(s) Signed: 09/28/2022 5:26:07 PM By: [name] Entered By: Karie Schwalbe on 09/28/2022 11:43:09 -------------------------------------------------------------------------------- Compression Therapy Details Patient Name: Date of Service: [name], [name] 09/28/2022 11:45 A [name] Medical Record Number: [id number] Patient Account Number: [id number] Date of Birth/Sex: Treating RN: Sep 15, 1932 (89 y.o. [name] Primary Care [name]: [name] Other Clinician: Referring [name]: Treating [name]: [name] in Treatment: 2 Compression Therapy  Performed for Wound Assessment: Wound #4 Right Calcaneus Performed By: Clinician Karie Schwalbe, RN Compression Type: Three Layer Electronic Signature(s) Signed: 09/28/2022 5:26:07 PM By: [name] Entered By: Karie Schwalbe on 09/28/2022 12:09:37 -------------------------------------------------------------------------------- Encounter Discharge Information Details Patient Name: Date of Service: [name] 09/28/2022 11:45 A [name] Medical Record Number: [id number] Patient Account Number: [id number] Date of Birth/Sex: Treating RN: [birth year] (89 y.o. [name] Primary Care Kamyiah Colantonio: [name] Other Clinician: Referring [name]: Treating [name]: [name] in Treatment: 2 Encounter Discharge Information Items Discharge Condition: Stable Ambulatory Status: Ambulatory Discharge Destination: Home Transportation: Private Auto Accompanied By: son Schedule Follow-up Appointment: Yes Clinical Summary of Care: Patient [name] ([id number]) [id number] Page 2 of 4 Electronic Signature(s) Signed: 09/28/2022 5:26:07 PM By: [name] Entered By: Karie Schwalbe on 09/28/2022 13:27:31 -------------------------------------------------------------------------------- Patient/Caregiver Education Details Patient Name: Date of Service: [place]ly [place], [name] Medical Record Number: [id number] Patient Account Number: [id number] Date of Birth/Gender: Treating RN: Jun 08, 1932 (89 y.o. [name] Primary Care Physician: [name] Other Clinician: Referring Physician: Treating Physician/Extender: [name] in Treatment: 2 Education Assessment Education Provided To: Patient Education Topics Provided Wound/Skin Impairment: Methods: Explain/Verbal Responses: Return demonstration correctly Electronic Signature(s) Signed: 09/28/2022  5:26:07 PM By: [name] Entered By: Karie Schwalbe on 09/28/2022 13:27:12 -------------------------------------------------------------------------------- Wound Assessment Details Patient Name: Date of Service: [name] 09/28/2022 11:45 A [name] Medical Record Number: [id number] Patient Account Number: [id number] Date of Birth/Sex: Treating RN: [birth year] (89 y.o. [name] Primary Care [name]: [name] Other Clinician: Referring [name]: Treating [name]: [name] in Treatment: 2 Wound Status Wound Number: 4 Primary Etiology: Diabetic Wound/Ulcer of the Lower Extremity Wound Location: Right Calcaneus Wound Status: Open Wounding Event: Pressure Injury Comorbid History: Cataracts, Hypertension, Type II Diabetes, Osteoarthritis Date Acquired: 08/18/2022  Weeks Of Treatment: 2 Clustered Wound: No Wound Measurements Length: (cm) 1 Width: (cm) 1.5 Depth: (cm) 0.1 Area: (cm) 1.178 Volume: (cm) 0.118 % Reduction in Area: 16.7% % Reduction in Volume: 16.3% Epithelialization: Small (1-33%) Tunneling: No Undermining: No Wound Description Classification: Grade 1 Wound Margin: Distinct, outline attached Exudate Amount: Medium Exudate Type: Serosanguineous Exudate Color: red, brown Foul Odor After Cleansing: No Slough/Fibrino Yes Wound Bed Granulation Amount: Large (67-100%) Exposed Structure Granulation Quality: Pink Fascia Exposed: No Necrotic Amount: Small (1-33%) Fat Layer (Subcutaneous Tissue) Exposed: Yes [name], [name] ([id number]) [id number] Page 3 of 4 Necrotic Quality: Adherent Slough Tendon Exposed: No Muscle Exposed: No Joint Exposed: No Bone Exposed: No Periwound Skin Texture Texture Color No Abnormalities Noted: Yes No Abnormalities Noted: Yes Moisture Temperature / Pain No Abnormalities Noted: Yes Temperature: No Abnormality Treatment Notes Wound #4 (Calcaneus)  Wound Laterality: Right Cleanser Soap and Water Discharge Instruction: May shower and wash wound with dial antibacterial soap and water prior to dressing change. Vashe 5.8 (oz) Discharge Instruction: Cleanse the wound with Vashe prior to applying a clean dressing using gauze sponges, not tissue or cotton balls. Wound Cleanser Discharge Instruction: Cleanse the wound with wound cleanser prior to applying a clean dressing using gauze sponges, not tissue or cotton balls. Peri-Wound Care Sween Lotion (Moisturizing lotion) Discharge Instruction: Apply moisturizing lotion as directed Topical Primary Dressing Maxorb Extra Ag+ Alginate Dressing, 2x2 (in/in) Discharge Instruction: Apply to wound bed as instructed Secondary Dressing Woven Gauze Sponge, Non-Sterile 4x4 in Discharge Instruction: Apply over primary dressing as directed. Heel Cup Discharge Instruction: right foot Secured With Transpore Surgical Tape, 2x10 (in/yd) Discharge Instruction: Secure dressing with tape as directed. Compression Wrap ThreePress (3 layer compression wrap) Discharge Instruction: Apply three layer compression as directed. Compression Stockings Add-Ons Electronic Signature(s) Signed: 09/28/2022 5:26:07 PM By: [name] Entered By: Karie Schwalbe on 09/28/2022 11:57:19 -------------------------------------------------------------------------------- Vitals Details Patient Name: Date of Service: [name] 09/28/2022 11:45 A [name] Medical Record Number: [id number] Patient Account Number: [id number] Date of Birth/Sex: Treating RN: 02/25/1933 (89 y.o. [name] Primary Care Jessey Stehlin: [name] Other Clinician: Referring Bronda Alfred: Treating Keilen Kahl/Extender: [name] in Treatment: 2 Vital Signs Time Taken: 11:48 Temperature (F): 97.8 Height (in): 65 Pulse (bpm): 68 Weight (lbs): 140 Respiratory Rate (breaths/min): [address] [name] ([id number])  [id number] Page 4 of 4 Body Mass Index (BMI): 23.3 Blood Pressure (mmHg): 156/68 Reference Range: 80 - 120 mg / dl Electronic Signature(s) Signed: 09/28/2022 5:26:07 PM By: [name] Entered By: Karie Schwalbe on 09/28/2022 11:56:47

## 2022-09-29 NOTE — Progress Notes (Signed)
KATHLEN, SAKURAI ([id number]) [id number] Page 1 of 1 Visit Report for 09/28/2022 SuperBill Details Patient Name: Date of Service: [facility], [name] 09/28/2022 Medical Record Number: [id number] Patient Account Number: [id number] Date of Birth/Sex: Treating RN: 02-06-1933 (89 y.o. [name] Primary Care Provider: Cranford Mon Other Clinician: Referring Provider: Treating Provider/Extender: [name] in Treatment: 2 Diagnosis Coding ICD-10 Codes Code Description [phone] Pressure ulcer of right heel, stage 3 I87.2 Venous insufficiency (chronic) (peripheral) E11.621 Type 2 diabetes mellitus with foot ulcer L03.115 Cellulitis of right lower limb Facility Procedures CPT4 Code Description Modifier Quantity [id number] (Facility Use Only) [phone] - APPLY MULTLAY COMPRS LWR RT LEG 1 ICD-10 Diagnosis Description L89.613 Pressure ulcer of right heel, stage 3 Electronic Signature(s) Signed: 09/28/2022 2:19:44 PM By: Duanne Guess MD FACS Signed: 09/28/2022 5:26:07 PM By: [name] Entered By: Karie Schwalbe on 09/28/2022 13:27:51

## 2022-10-05 ENCOUNTER — Encounter (HOSPITAL_BASED_OUTPATIENT_CLINIC_OR_DEPARTMENT_OTHER): Payer: Medicare Other | Admitting: General Surgery

## 2022-10-05 DIAGNOSIS — I129 Hypertensive chronic kidney disease with stage 1 through stage 4 chronic kidney disease, or unspecified chronic kidney disease: Secondary | ICD-10-CM | POA: Diagnosis not present

## 2022-10-05 DIAGNOSIS — I10 Essential (primary) hypertension: Secondary | ICD-10-CM | POA: Diagnosis not present

## 2022-10-05 DIAGNOSIS — L89613 Pressure ulcer of right heel, stage 3: Secondary | ICD-10-CM | POA: Diagnosis not present

## 2022-10-05 DIAGNOSIS — E1122 Type 2 diabetes mellitus with diabetic chronic kidney disease: Secondary | ICD-10-CM | POA: Diagnosis not present

## 2022-10-05 DIAGNOSIS — E11621 Type 2 diabetes mellitus with foot ulcer: Secondary | ICD-10-CM | POA: Diagnosis not present

## 2022-10-05 DIAGNOSIS — E11319 Type 2 diabetes mellitus with unspecified diabetic retinopathy without macular edema: Secondary | ICD-10-CM | POA: Diagnosis not present

## 2022-10-05 DIAGNOSIS — E1140 Type 2 diabetes mellitus with diabetic neuropathy, unspecified: Secondary | ICD-10-CM | POA: Diagnosis not present

## 2022-10-05 DIAGNOSIS — L03115 Cellulitis of right lower limb: Secondary | ICD-10-CM | POA: Diagnosis not present

## 2022-10-05 DIAGNOSIS — M1990 Unspecified osteoarthritis, unspecified site: Secondary | ICD-10-CM | POA: Diagnosis not present

## 2022-10-05 DIAGNOSIS — I872 Venous insufficiency (chronic) (peripheral): Secondary | ICD-10-CM | POA: Diagnosis not present

## 2022-10-05 DIAGNOSIS — N189 Chronic kidney disease, unspecified: Secondary | ICD-10-CM | POA: Diagnosis not present

## 2022-10-05 DIAGNOSIS — L97811 Non-pressure chronic ulcer of other part of right lower leg limited to breakdown of skin: Secondary | ICD-10-CM | POA: Diagnosis not present

## 2022-10-05 DIAGNOSIS — L97412 Non-pressure chronic ulcer of right heel and midfoot with fat layer exposed: Secondary | ICD-10-CM | POA: Diagnosis not present

## 2022-10-05 DIAGNOSIS — E1151 Type 2 diabetes mellitus with diabetic peripheral angiopathy without gangrene: Secondary | ICD-10-CM | POA: Diagnosis not present

## 2022-10-05 NOTE — Progress Notes (Signed)
ANISHA, ITA ([id number]) [id number] Page 1 of 9 Visit Report for 10/05/2022 Chief Complaint Document Details Patient Name: Date of Service: [place] [name], [place] TTIE B. 10/05/2022 11:45 A M Medical Record Number: [id number] Patient Account Number: [id number] Date of Birth/Sex: Treating RN: 19-Feb-1933 (89 y.o. F) Primary Care Provider: Cranford Mon Other Clinician: Referring Provider: Treating Provider/Extender: [name] in Treatment: 3 Information Obtained from: Patient Chief Complaint Patient presents for treatment of multiple open ulcers due to suspected venous insufficiency and a gluteal pressure ulcer 09/10/2022: pressure ulcer of right heel Electronic Signature(s) Signed: 10/05/2022 11:59:05 AM By: Duanne Guess MD FACS Entered By: Duanne Guess on 10/05/2022 11:59:05 -------------------------------------------------------------------------------- Debridement Details Patient Name: Date of Service: [name], [name] 10/05/2022 11:45 A M Medical Record Number: [id number] Patient Account Number: [id number] Date of Birth/Sex: Treating RN: December 12, 1932 (89 y.o. Fredderick Phenix Primary Care Provider: Cranford Mon Other Clinician: Referring Provider: Treating Provider/Extender: [name] in Treatment: 3 Debridement Performed for Assessment: Wound #4 Right Calcaneus Performed By: Physician Duanne Guess, MD Debridement Type: Debridement Severity of Tissue Pre Debridement: Fat layer exposed Level of Consciousness (Pre-procedure): Awake and Alert Pre-procedure Verification/Time Out Yes - 11:52 Taken: Start Time: 11:52 Pain Control: Lidocaine 4% Topical Solution Percent of Wound Bed Debrided: 100% T Area Debrided (cm): otal 0.01 Tissue and other material debrided: Non-Viable, Eschar Level: Non-Viable Tissue Debridement Description: Selective/Open Wound Instrument: Curette Bleeding:  Minimum Hemostasis Achieved: Pressure Response to Treatment: Procedure was tolerated well Level of Consciousness (Post- Awake and Alert procedure): Post Debridement Measurements of Total Wound Length: (cm) 0.1 Width: (cm) 0.1 Depth: (cm) 0.1 Volume: (cm) 0.001 Character of Wound/Ulcer Post Debridement: Improved Severity of Tissue Post Debridement: Fat layer exposed SHAWONNA, DRENNER B ([id number]) [id number] Page 2 of 9 Post Procedure Diagnosis Same as Pre-procedure Notes scribed for Dr. Lady Gary by Samuella Bruin, RN Electronic Signature(s) Signed: 10/05/2022 12:19:28 PM By: Duanne Guess MD FACS Signed: 10/05/2022 4:05:21 PM By: Samuella Bruin Entered By: Samuella Bruin on 10/05/2022 11:55:27 -------------------------------------------------------------------------------- HPI Details Patient Name: Date of Service: [name], [name] 10/05/2022 11:45 A M Medical Record Number: [id number] Patient Account Number: [id number] Date of Birth/Sex: Treating RN: Apr 27, 1932 (89 y.o. F) Primary Care Provider: Cranford Mon Other Clinician: Referring Provider: Treating Provider/Extender: [name] in Treatment: 3 History of Present Illness HPI Description: ADMISSION 04/29/2022 This is an 89 year old type II diabetic (no hemoglobin A1c available for review) who also has hypertension, neuropathy, peripheral angiopathy, chronic kidney disease, among other medical comorbidities. Her history is somewhat difficult to put together as she is extremely hard of hearing and according to her son, who accompanies her today, she does not reliably see physicians. There are apparently some concerns for self-neglect, but the family dynamics are complicated. She has 2+ pitting edema bilaterally with bilateral erythema suggestive of stasis dermatitis. What little outside information was sent ahead indicates that she was thought to potentially have  cellulitis and is she is currently taking Keflex for this. She also has open wounds on her right lower extremity on both the anterior, posterior, and medial aspects. They vary in depth but all have slough and nonviable subcutaneous tissue present. She has a small pressure ulcer on her left gluteus, just adjacent to the natal cleft. 05/06/2022: The anterior lower leg wound is healed. The posterior and medial lower leg wounds have accumulated a substantial amount of slough. Edema control bilaterally is markedly improved from  last week. The pressure ulcer on her left buttock is smaller and quite clean with just a small amount of slough and eschar present. 05/14/2022: The gluteal ulcer is healed. The ulcers on her posterior and medial lower leg are cleaner but still with a fair amount of slough buildup. They are less tender than on prior visits. Edema control bilaterally is good. 05/24/2022: The ulcers on her right lower leg are smaller and cleaner again today. Significantly less slough accumulation. Edema control is good. 06/04/2022: All of her wounds are nearly healed. The remaining open areas are small and superficial with just a little slough and eschar present. Edema control is excellent. 06/15/2022: Her wounds are healed. READMISSION 09/10/2022: She returns today with a stage III pressure ulcer on the right heel. She is clearly been adhering to my recommendation that she keep her feet elevated, but she has been propping her foot up on an ottoman and as a result, has had tissue breakdown at the posterior calcaneus. She has been wearing a bunny boot type slipper in bed at night, but this has caused an abrasion on her medial right lower leg. There is slough on the heel; at the ankle wound is clean. 09/17/2022: The ankle wound has healed. The heel ulcer is measuring smaller and has just a small amount of slough on the surface. Edema control is good. 6/7; patient has a pressure ulcer on the right lateral heel.  She comes in today complaining of increasing pain with our intake nurse noting purulent drainage when she would change the dressing. The patient has a temperature of 99.1 otherwise her vitals are stable. Because of her hearing loss it is hard to really have a conversation although it is fairly reliable that she thinks the pain is more intense 10/05/2022: There is a large thick layer of eschar overlying her wound. Underneath, it is about half a centimeter in diameter, very superficial, and clean. Electronic Signature(s) Signed: 10/05/2022 12:04:36 PM By: Duanne Guess MD FACS Entered By: Duanne Guess on 10/05/2022 12:04:35 [name] ([id number]) [id number] Page 3 of 9 -------------------------------------------------------------------------------- Physical Exam Details Patient Name: Date of Service: [place] [name], [place] TTIE B. 10/05/2022 11:45 A M Medical Record Number: [id number] Patient Account Number: [id number] Date of Birth/Sex: Treating RN: 1932/09/13 (89 y.o. F) Primary Care Provider: Cranford Mon Other Clinician: Referring Provider: Treating Provider/Extender: Eloy End Weeks in Treatment: 3 Constitutional . . . . no acute distress. Respiratory Normal work of breathing on room air. Notes 10/05/2022: There is a large thick layer of eschar overlying her wound. Underneath, it is about half a centimeter in diameter, very superficial, and clean. Electronic Signature(s) Signed: 10/05/2022 12:05:05 PM By: Duanne Guess MD FACS Entered By: Duanne Guess on 10/05/2022 12:05:05 -------------------------------------------------------------------------------- Physician Orders Details Patient Name: Date of Service: [name], [name] 10/05/2022 11:45 A M Medical Record Number: [id number] Patient Account Number: [id number] Date of Birth/Sex: Treating RN: 24-Nov-1932 (89 y.o. Fredderick Phenix Primary Care Provider: Cranford Mon Other  Clinician: Referring Provider: Treating Provider/Extender: [name] in Treatment: 3 Verbal / Phone Orders: No Diagnosis Coding ICD-10 Coding Code Description [phone] Pressure ulcer of right heel, stage 3 I87.2 Venous insufficiency (chronic) (peripheral) E11.621 Type 2 diabetes mellitus with foot ulcer Follow-up Appointments ppointment in 1 week. - Dr. Lady Gary Room 3 Return A Anesthetic Wound #4 Right Calcaneus (In clinic) Topical Lidocaine 4% applied to wound bed Bathing/ Shower/ Hygiene Other Bathing/Shower/Hygiene Orders/Instructions: - May sponge  bathe or shower with cast protector on right leg Off-Loading Wound #4 Right Calcaneus Prevalon Boot Other: - Place pillow behind calf, to prop leg up. Wound Treatment Wound #4 - Calcaneus Wound Laterality: Right Cleanser: Soap and Water 1 x Per Week/30 Days Discharge Instructions: May shower and wash wound with dial antibacterial soap and water prior to dressing change. Cleanser: Vashe 5.8 (oz) 1 x Per Week/30 Days Discharge Instructions: Cleanse the wound with Vashe prior to applying a clean dressing using gauze sponges, not tissue or cotton balls. Cleanser: Wound Cleanser 1 x Per Week/30 Days LOEVA, MEANOR ([id number]) [id number] Page 4 of 9 Discharge Instructions: Cleanse the wound with wound cleanser prior to applying a clean dressing using gauze sponges, not tissue or cotton balls. Peri-Wound Care: Sween Lotion (Moisturizing lotion) 1 x Per Week/30 Days Discharge Instructions: Apply moisturizing lotion as directed Prim Dressing: Maxorb Extra Ag+ Alginate Dressing, 2x2 (in/in) 1 x Per Week/30 Days ary Discharge Instructions: Apply to wound bed as instructed Secondary Dressing: ALLEVYN Heel 4 1/2in x 5 1/2in / 10.5cm x 13.5cm 1 x Per Week/30 Days Discharge Instructions: Apply over primary dressing as directed. Secondary Dressing: Woven Gauze Sponge, Non-Sterile 4x4 in 1 x  Per Week/30 Days Discharge Instructions: Apply over primary dressing as directed. Secured With: Transpore Surgical Tape, 2x10 (in/yd) 1 x Per Week/30 Days Discharge Instructions: Secure dressing with tape as directed. Compression Wrap: Urgo K2 Lite, (equivalent to a 3 layer) two layer compression system, regular 1 x Per Week/30 Days Discharge Instructions: Apply Urgo K2 Lite as directed (alternative to 3 layer compression). Patient Medications llergies: No Known Allergies A Notifications Medication Indication Start End 10/05/2022 lidocaine DOSE topical 4 % cream - cream topical Electronic Signature(s) Signed: 10/05/2022 12:19:28 PM By: Duanne Guess MD FACS Entered By: Duanne Guess on 10/05/2022 12:05:21 -------------------------------------------------------------------------------- Problem List Details Patient Name: Date of Service: [name], [name] 10/05/2022 11:45 A M Medical Record Number: [id number] Patient Account Number: [id number] Date of Birth/Sex: Treating RN: 05/11/1932 (89 y.o. F) Primary Care Provider: Cranford Mon Other Clinician: Referring Provider: Treating Provider/Extender: [name] in Treatment: 3 Active Problems ICD-10 Encounter Code Description Active Date MDM Diagnosis L89.613 Pressure ulcer of right heel, stage 3 09/10/2022 No Yes I87.2 Venous insufficiency (chronic) (peripheral) 09/10/2022 No Yes E11.621 Type 2 diabetes mellitus with foot ulcer 09/10/2022 No Yes Inactive Problems Resolved Problems ICD-10 ONEDIA, NIEMEIER ([id number]) [id number] Page 5 of 9 Code Description Active Date Resolved Date L97.811 Non-pressure chronic ulcer of other part of right lower leg limited to breakdown of skin 09/10/2022 09/10/2022 Electronic Signature(s) Signed: 10/05/2022 11:58:48 AM By: Duanne Guess MD FACS Entered By: Duanne Guess on 10/05/2022  11:58:48 -------------------------------------------------------------------------------- Progress Note Details Patient Name: Date of Service: [name], [name] 10/05/2022 11:45 A M Medical Record Number: [id number] Patient Account Number: [id number] Date of Birth/Sex: Treating RN: 05/27/1932 (89 y.o. F) Primary Care Provider: Cranford Mon Other Clinician: Referring Provider: Treating Provider/Extender: [name] in Treatment: 3 Subjective Chief Complaint Information obtained from Patient Patient presents for treatment of multiple open ulcers due to suspected venous insufficiency and a gluteal pressure ulcer 09/10/2022: pressure ulcer of right heel History of Present Illness (HPI) ADMISSION 04/29/2022 This is an 89 year old type II diabetic (no hemoglobin A1c available for review) who also has hypertension, neuropathy, peripheral angiopathy, chronic kidney disease, among other medical comorbidities. Her history is somewhat difficult to put together as she is extremely hard of  hearing and according to her son, who accompanies her today, she does not reliably see physicians. There are apparently some concerns for self-neglect, but the family dynamics are complicated. She has 2+ pitting edema bilaterally with bilateral erythema suggestive of stasis dermatitis. What little outside information was sent ahead indicates that she was thought to potentially have cellulitis and is she is currently taking Keflex for this. She also has open wounds on her right lower extremity on both the anterior, posterior, and medial aspects. They vary in depth but all have slough and nonviable subcutaneous tissue present. She has a small pressure ulcer on her left gluteus, just adjacent to the natal cleft. 05/06/2022: The anterior lower leg wound is healed. The posterior and medial lower leg wounds have accumulated a substantial amount of slough. Edema control bilaterally is markedly  improved from last week. The pressure ulcer on her left buttock is smaller and quite clean with just a small amount of slough and eschar present. 05/14/2022: The gluteal ulcer is healed. The ulcers on her posterior and medial lower leg are cleaner but still with a fair amount of slough buildup. They are less tender than on prior visits. Edema control bilaterally is good. 05/24/2022: The ulcers on her right lower leg are smaller and cleaner again today. Significantly less slough accumulation. Edema control is good. 06/04/2022: All of her wounds are nearly healed. The remaining open areas are small and superficial with just a little slough and eschar present. Edema control is excellent. 06/15/2022: Her wounds are healed. READMISSION 09/10/2022: She returns today with a stage III pressure ulcer on the right heel. She is clearly been adhering to my recommendation that she keep her feet elevated, but she has been propping her foot up on an ottoman and as a result, has had tissue breakdown at the posterior calcaneus. She has been wearing a bunny boot type slipper in bed at night, but this has caused an abrasion on her medial right lower leg. There is slough on the heel; at the ankle wound is clean. 09/17/2022: The ankle wound has healed. The heel ulcer is measuring smaller and has just a small amount of slough on the surface. Edema control is good. 6/7; patient has a pressure ulcer on the right lateral heel. She comes in today complaining of increasing pain with our intake nurse noting purulent drainage when she would change the dressing. The patient has a temperature of 99.1 otherwise her vitals are stable. Because of her hearing loss it is hard to really have a conversation although it is fairly reliable that she thinks the pain is more intense 10/05/2022: There is a large thick layer of eschar overlying her wound. Underneath, it is about half a centimeter in diameter, very superficial, and clean. Patient  History Information obtained from Patient. Family History Unknown History. Social History Never smoker, Marital Status - Widowed, Alcohol Use - Never, Drug Use - No History, Caffeine Use - Never. Medical History Eyes LASHICA, ZACHAR ([id number]) [id number] Page 6 of 9 Patient has history of Cataracts Cardiovascular Patient has history of Hypertension Endocrine Patient has history of Type II Diabetes Musculoskeletal Patient has history of Osteoarthritis Hospitalization/Surgery History - cataract extraction. - rectocele repair. - abdominal hysterectomy. - breast surgery. - c-eye surgery procedure. - eye surgery. Medical A Surgical History Notes nd Eyes diabetic retinopathy, macular degeneration Objective Constitutional no acute distress. Vitals Time Taken: 11:42 AM, Height: 65 in, Weight: 140 lbs, BMI: 23.3, Temperature: 98.8 F, Pulse: 74 bpm, Respiratory Rate:  18 breaths/min, Blood Pressure: 138/62 mmHg. Respiratory Normal work of breathing on room air. General Notes: 10/05/2022: There is a large thick layer of eschar overlying her wound. Underneath, it is about half a centimeter in diameter, very superficial, and clean. Integumentary (Hair, Skin) Wound #4 status is Open. Original cause of wound was Pressure Injury. The date acquired was: 08/18/2022. The wound has been in treatment 3 weeks. The wound is located on the Right Calcaneus. The wound measures 0.1cm length x 0.1cm width x 0.1cm depth; 0.008cm^2 area and 0.001cm^3 volume. There is no tunneling or undermining noted. There is a none present amount of drainage noted. The wound margin is distinct with the outline attached to the wound base. There is no granulation within the wound bed. There is a large (67-100%) amount of necrotic tissue within the wound bed including Eschar. The periwound skin appearance had no abnormalities noted for texture. The periwound skin appearance had no abnormalities noted  for moisture. The periwound skin appearance had no abnormalities noted for color. Periwound temperature was noted as No Abnormality. Assessment Active Problems ICD-10 Pressure ulcer of right heel, stage 3 Venous insufficiency (chronic) (peripheral) Type 2 diabetes mellitus with foot ulcer Procedures Wound #4 Pre-procedure diagnosis of Wound #4 is a Diabetic Wound/Ulcer of the Lower Extremity located on the Right Calcaneus .Severity of Tissue Pre Debridement is: Fat layer exposed. There was a Selective/Open Wound Non-Viable Tissue Debridement with a total area of 0.01 sq cm performed by Duanne Guess, MD. With the following instrument(s): Curette to remove Non-Viable tissue/material. Material removed includes Eschar after achieving pain control using Lidocaine 4% T opical Solution. No specimens were taken. A time out was conducted at 11:52, prior to the start of the procedure. A Minimum amount of bleeding was controlled with Pressure. The procedure was tolerated well. Post Debridement Measurements: 0.1cm length x 0.1cm width x 0.1cm depth; 0.001cm^3 volume. Character of Wound/Ulcer Post Debridement is improved. Severity of Tissue Post Debridement is: Fat layer exposed. Post procedure Diagnosis Wound #4: Same as Pre-Procedure General Notes: scribed for Dr. Lady Gary by Samuella Bruin, RN. Pre-procedure diagnosis of Wound #4 is a Diabetic Wound/Ulcer of the Lower Extremity located on the Right Calcaneus . There was a Double Layer Compression Therapy Procedure by Burt Ek, RN. Post procedure Diagnosis Wound #4: Same as Pre-Procedure Plan ASHLI, RENTER ([id number]) [id number] Page 7 of 9 Follow-up Appointments: Return Appointment in 1 week. - Dr. Lady Gary Room 3 Anesthetic: Wound #4 Right Calcaneus: (In clinic) Topical Lidocaine 4% applied to wound bed Bathing/ Shower/ Hygiene: Other Bathing/Shower/Hygiene Orders/Instructions: - May sponge bathe or  shower with cast protector on right leg Off-Loading: Wound #4 Right Calcaneus: Prevalon Boot Other: - Place pillow behind calf, to prop leg up. The following medication(s) was prescribed: lidocaine topical 4 % cream cream topical was prescribed at facility WOUND #4: - Calcaneus Wound Laterality: Right Cleanser: Soap and Water 1 x Per Week/30 Days Discharge Instructions: May shower and wash wound with dial antibacterial soap and water prior to dressing change. Cleanser: Vashe 5.8 (oz) 1 x Per Week/30 Days Discharge Instructions: Cleanse the wound with Vashe prior to applying a clean dressing using gauze sponges, not tissue or cotton balls. Cleanser: Wound Cleanser 1 x Per Week/30 Days Discharge Instructions: Cleanse the wound with wound cleanser prior to applying a clean dressing using gauze sponges, not tissue or cotton balls. Peri-Wound Care: Sween Lotion (Moisturizing lotion) 1 x Per Week/30 Days Discharge Instructions: Apply moisturizing lotion as directed Prim Dressing: Maxorb  Extra Ag+ Alginate Dressing, 2x2 (in/in) 1 x Per Week/30 Days ary Discharge Instructions: Apply to wound bed as instructed Secondary Dressing: ALLEVYN Heel 4 1/2in x 5 1/2in / 10.5cm x 13.5cm 1 x Per Week/30 Days Discharge Instructions: Apply over primary dressing as directed. Secondary Dressing: Woven Gauze Sponge, Non-Sterile 4x4 in 1 x Per Week/30 Days Discharge Instructions: Apply over primary dressing as directed. Secured With: Transpore Surgical T ape, 2x10 (in/yd) 1 x Per Week/30 Days Discharge Instructions: Secure dressing with tape as directed. Com pression Wrap: Urgo K2 Lite, (equivalent to a 3 layer) two layer compression system, regular 1 x Per Week/30 Days Discharge Instructions: Apply Urgo K2 Lite as directed (alternative to 3 layer compression). 10/05/2022: There is a large thick layer of eschar overlying her wound. Underneath, it is about half a centimeter in diameter, very superficial, and  clean. I used a curette to debride the eschar from her wound. We will continue silver alginate with 3 layer compression/equivalent. She will continue to float her heel and avoid pressure to the site. Anticipate she will likely be completely healed At Her Visit Next Week. Electronic Signature(s) Signed: 10/05/2022 12:06:26 PM By: Duanne Guess MD FACS Entered By: Duanne Guess on 10/05/2022 12:06:26 -------------------------------------------------------------------------------- HxROS Details Patient Name: Date of Service: [name], [name] 10/05/2022 11:45 A M Medical Record Number: [id number] Patient Account Number: [id number] Date of Birth/Sex: Treating RN: 08/10/1932 (89 y.o. F) Primary Care Provider: Cranford Mon Other Clinician: Referring Provider: Treating Provider/Extender: [name] in Treatment: 3 Information Obtained From Patient Eyes Medical History: Positive for: Cataracts Past Medical History Notes: diabetic retinopathy, macular degeneration Cardiovascular Medical History: Positive for: Hypertension Endocrine Medical HistoryMarland Kitchen LAN, CHANNELL ([id number]) [id number] Page 8 of 9 Positive for: Type II Diabetes Time with diabetes: 45 yrs Treated with: Insulin Blood sugar tested every day: Yes Tested : Musculoskeletal Medical History: Positive for: Osteoarthritis HBO Extended History Items Eyes: Cataracts Immunizations Pneumococcal Vaccine: Received Pneumococcal Vaccination: No Implantable Devices None Hospitalization / Surgery History Type of Hospitalization/Surgery cataract extraction rectocele repair abdominal hysterectomy breast surgery c-eye surgery procedure eye surgery Family and Social History Unknown History: Yes; Never smoker; Marital Status - Widowed; Alcohol Use: Never; Drug Use: No History; Caffeine Use: Never; Financial Concerns: No; Food, Clothing or Shelter Needs: No; Support  System Lacking: No; Transportation Concerns: No Electronic Signature(s) Signed: 10/05/2022 12:19:28 PM By: Duanne Guess MD FACS Entered By: Duanne Guess on 10/05/2022 12:04:41 -------------------------------------------------------------------------------- SuperBill Details Patient Name: Date of Service: [name], [name] 10/05/2022 Medical Record Number: [id number] Patient Account Number: [id number] Date of Birth/Sex: Treating RN: 04-10-1933 (89 y.o. F) Primary Care Provider: Cranford Mon Other Clinician: Referring Provider: Treating Provider/Extender: [name] in Treatment: 3 Diagnosis Coding ICD-10 Codes Code Description [id number] Pressure ulcer of right heel, stage 3 I87.2 Venous insufficiency (chronic) (peripheral) E11.621 Type 2 diabetes mellitus with foot ulcer Facility Procedures Physician Procedures : CPT4 Code Description Modifier [id number] - WC PHYS LEVEL 3 - EST PT 25 ICD-10 Diagnosis Description L89.613 Pressure ulcer of right heel, stage 3 I87.2 Venous insufficiency (chronic) (peripheral) E11.621 Type 2 diabetes mellitus with foot ulcer Quantity: 1 : [id number] - WC PHYS DEBR WO ANESTH 20 SQ CM ICD-10 Diagnosis Description L89.613 Pressure ulcer of right heel, stage 3 Quantity: 1 Electronic Signature(s) Signed: 10/05/2022 12:19:14 PM By: Duanne Guess MD FACS Entered By: Duanne Guess on 10/05/2022 12:19:13

## 2022-10-05 NOTE — Progress Notes (Signed)
[name], JOEL ([id number]) [id number] Page 1 of 7 Visit Report for 10/05/2022 Arrival Information Details Patient Name: Date of Service: [name], [name] [name] 10/05/2022 11:45 A M Medical Record Number: [id number] Patient Account Number: [id number] Date of Birth/Sex: Treating RN: 20-Nov-1932 (89 y.o. [name] Primary Care Tineka Uriegas: [place] Other Clinician: Referring Travion Ke: Treating Shahab Polhamus/Extender: [name] in Treatment: 3 Visit Information History Since Last Visit Added or deleted any medications: No Patient Arrived: Ambulatory Any new allergies or adverse reactions: No Arrival Time: 11:39 Had a fall or experienced change in No Accompanied By: granddaughter activities of daily living that may affect Transfer Assistance: None risk of falls: Patient Identification Verified: Yes Signs or symptoms of abuse/neglect since last visito No Secondary Verification Process Completed: Yes Hospitalized since last visit: No Patient Requires Transmission-Based Precautions: No Implantable device outside of the clinic excluding No Patient Has Alerts: Yes cellular tissue based products placed in the center Patient Alerts: ABI R 0.87 since last visit: Has Dressing in Place as Prescribed: Yes Has Compression in Place as Prescribed: Yes Pain Present Now: No Electronic Signature(s) Signed: 10/05/2022 4:05:21 PM By: Samuella Bruin Entered By: Samuella Bruin on 10/05/2022 11:42:43 -------------------------------------------------------------------------------- Compression Therapy Details Patient Name: Date of Service: Kimberly [name], Kimberly Kimberly B. 10/05/2022 11:45 A M Medical Record Number: [id number] Patient Account Number: [id number] Date of Birth/Sex: Treating RN: [birth year] (89 y.o. [name] Primary Care Yael Coppess: [place] Mon Other Clinician: Referring Monifah Freehling: Treating Neve Branscomb/Extender: [name] in Treatment: 3 Compression Therapy Performed for Wound Assessment: Wound #4 Right Calcaneus Performed By: Clinician Samuella Bruin, RN Compression Type: Double Layer Post Procedure Diagnosis Same as Pre-procedure Electronic Signature(s) Signed: 10/05/2022 4:05:21 PM By: Samuella Bruin Entered By: Samuella Bruin on 10/05/2022 11:54:39 Bost, Cicley B ([id number]) [id number] Page 2 of 7 -------------------------------------------------------------------------------- Encounter Discharge Information Details Patient Name: Date of Service: [facility] [name], [name] [name] 10/05/2022 11:45 A M Medical Record Number: [id number] Patient Account Number: [id number] Date of Birth/Sex: Treating RN: [birth year] (89 y.o. [name] Primary Care Doniven Vanpatten: [place] Mon Other Clinician: Referring Lashaundra Lehrmann: Treating Elster Corbello/Extender: [name] in Treatment: 3 Encounter Discharge Information Items Post Procedure Vitals Discharge Condition: Stable Temperature (F): 98.8 Ambulatory Status: Ambulatory Pulse (bpm): 74 Discharge Destination: Home Respiratory Rate (breaths/min): 18 Transportation: Private Auto Blood Pressure (mmHg): 138/62 Accompanied By: granddaughter Schedule Follow-up Appointment: Yes Clinical Summary of Care: Patient Declined Electronic Signature(s) Signed: 10/05/2022 4:05:21 PM By: Samuella Bruin Entered By: Samuella Bruin on 10/05/2022 12:11:28 -------------------------------------------------------------------------------- Lower Extremity Assessment Details Patient Name: Date of Service: Kimberly [name], Kimberly Kimberly B. 10/05/2022 11:45 A M Medical Record Number: [id number] Patient Account Number: [id number] Date of Birth/Sex: Treating RN: 1932-07-14 (89 y.o. [name] Primary Care Tayva Easterday: [place] Mon Other Clinician: Referring [name]: Treating Annai Heick/Extender: Eloy End Weeks in Treatment: 3 Edema Assessment Assessed: [Left: No] [Right: No] [Left: Edema] [Right: :] Calf Left: Right: Point of Measurement: 33 cm From Medial Instep 29.4 cm Ankle Left: Right: Point of Measurement: 11 cm From Medial Instep 18.5 cm Vascular Assessment Pulses: Dorsalis Pedis Palpable: [Right:Yes] Electronic Signature(s) Signed: 10/05/2022 4:05:21 PM By: Samuella Bruin Entered By: Samuella Bruin on 10/05/2022 11:45:41 Multi Wound Chart Details -------------------------------------------------------------------------------- [name] ([id number]) [id number] Page 3 of 7 Patient Name: Date of Service: [name], [name] Kimberly B. 10/05/2022 11:45 A M Medical Record Number: [id number] Patient Account Number: [id number] Date of Birth/Sex: Treating  RN: Sep 07, 1932 (89 y.o. F) Primary Care Remo Kirschenmann: [place] Mon Other Clinician: Referring Leaner Morici: Treating [name]/Extender: [name] in Treatment: 3 Vital Signs Height(in): 65 Pulse(bpm): 74 Weight(lbs): 140 Blood Pressure(mmHg): 138/62 Body Mass Index(BMI): 23.3 Temperature(F): 98.8 Respiratory Rate(breaths/min): 18 Wound Assessments Wound Number: 4 N/A N/A Photos: N/A N/A Right Calcaneus N/A N/A Wound Location: Pressure Injury N/A N/A Wounding Event: Diabetic Wound/Ulcer of the Lower N/A N/A Primary Etiology: Extremity Cataracts, Hypertension, Type II N/A N/A Comorbid History: Diabetes, Osteoarthritis 08/18/2022 N/A N/A Date Acquired: 3 N/A N/A Weeks of Treatment: Open N/A N/A Wound Status: No N/A N/A Wound Recurrence: 0.1x0.1x0.1 N/A N/A Measurements L x W x D (cm) 0.008 N/A N/A A (cm) : rea 0.001 N/A N/A Volume (cm) : 99.40% N/A N/A % Reduction in A rea: 99.30% N/A N/A % Reduction in Volume: Grade 1 N/A N/A Classification: None Present N/A N/A Exudate A mount: Distinct, outline attached N/A N/A Wound  Margin: None Present (0%) N/A N/A Granulation A mount: Large (67-100%) N/A N/A Necrotic A mount: Eschar N/A N/A Necrotic Tissue: Fascia: No N/A N/A Exposed Structures: Fat Layer (Subcutaneous Tissue): No Tendon: No Muscle: No Joint: No Bone: No Large (67-100%) N/A N/A Epithelialization: Debridement - Selective/Open Wound N/A N/A Debridement: Pre-procedure Verification/Time Out 11:52 N/A N/A Taken: Lidocaine 4% Topical Solution N/A N/A Pain Control: Necrotic/Eschar N/A N/A Tissue Debrided: Non-Viable Tissue N/A N/A Level: 0.01 N/A N/A Debridement A (sq cm): rea Curette N/A N/A Instrument: Minimum N/A N/A Bleeding: Pressure N/A N/A Hemostasis A chieved: Procedure was tolerated well N/A N/A Debridement Treatment Response: 0.1x0.1x0.1 N/A N/A Post Debridement Measurements L x W x D (cm) 0.001 N/A N/A Post Debridement Volume: (cm) No Abnormalities Noted N/A N/A Periwound Skin Texture: No Abnormalities Noted N/A N/A Periwound Skin Moisture: No Abnormalities Noted N/A N/A Periwound Skin Color: No Abnormality N/A N/A Temperature: Compression Therapy N/A N/A Procedures Performed: Debridement Treatment Notes Electronic Signature(s) Signed: 10/05/2022 11:58:56 AM By: Duanne Guess MD FACS Fayrene Fearing, Kemia B ([id number]) [phone].pdf Page 4 of 7 Entered By: Duanne Guess on 10/05/2022 11:58:55 -------------------------------------------------------------------------------- Multi-Disciplinary Care Plan Details Patient Name: Date of Service: [place] [name], [name] Kimberly B. 10/05/2022 11:45 A M Medical Record Number: [id number] Patient Account Number: [id number] Date of Birth/Sex: Treating RN: 1932-11-29 (89 y.o. [name] Primary Care Ahonesty Woodfin: [place] Mon Other Clinician: Referring Imanni Burdine: Treating Eban Weick/Extender: [name] in Treatment: 3 Active Inactive Wound/Skin Impairment Nursing  Diagnoses: Impaired tissue integrity Goals: Patient/caregiver will verbalize understanding of skin care regimen Date Initiated: 09/10/2022 Target Resolution Date: 12/18/2022 Goal Status: Active Interventions: Assess ulceration(s) every visit Treatment Activities: Skin care regimen initiated : 09/10/2022 Notes: Electronic Signature(s) Signed: 10/05/2022 4:05:21 PM By: Samuella Bruin Entered By: Samuella Bruin on 10/05/2022 11:53:44 -------------------------------------------------------------------------------- Pain Assessment Details Patient Name: Date of Service: Kimberly [name], Kimberly Kimberly B. 10/05/2022 11:45 A M Medical Record Number: [id number] Patient Account Number: [id number] Date of Birth/Sex: Treating RN: 17-May-1932 (89 y.o. [name] Primary Care Rennie Rouch: [place] Mon Other Clinician: Referring Shephanie Romas: Treating Taylore Hinde/Extender: [name] in Treatment: 3 Active Problems Location of Pain Severity and Description of Pain Patient Has Paino No Site Locations Rate the pain. TONASIA, DELAIR ([id number]) [id number] Page 5 of 7 Rate the pain. Current Pain Level: 0 Pain Management and Medication Current Pain Management: Electronic Signature(s) Signed: 10/05/2022 4:05:21 PM By: Samuella Bruin Entered By: Samuella Bruin on 10/05/2022 11:43:02 -------------------------------------------------------------------------------- Patient/Caregiver Education Details Patient Name: Date of  Service: [name] [name], Kimberly Kimberly Leonard Schwartz 6/18/2024andnbsp11:45 A M Medical Record Number: [id number] Patient Account Number: [id number] Date of Birth/Gender: Treating RN: 1933/01/30 (89 y.o. [name] Primary Care Physician: [place] Mon Other Clinician: Referring Physician: Treating Physician/Extender: [name] in Treatment: 3 Education Assessment Education Provided To: Patient Education Topics  Provided Wound Debridement: Methods: Explain/Verbal Responses: Reinforcements needed, State content correctly Electronic Signature(s) Signed: 10/05/2022 4:05:21 PM By: Samuella Bruin Entered By: Samuella Bruin on 10/05/2022 11:53:54 -------------------------------------------------------------------------------- Wound Assessment Details Patient Name: Date of Service: [name] [name], Kimberly Kimberly B. 10/05/2022 11:45 A M Medical Record Number: [id number] Patient Account Number: [id number] Date of Birth/Sex: Treating RN: March 21, 1933 (89 y.o. [name] Primary Care Trace Wirick: [place] Mon Other Clinician: Referring Medhansh Brinkmeier: Treating Bradie Lacock/Extender: Remy, Beevers, Yamila B ([id number]) [phone].pdf Page 6 of 7 Weeks in Treatment: 3 Wound Status Wound Number: 4 Primary Etiology: Diabetic Wound/Ulcer of the Lower Extremity Wound Location: Right Calcaneus Wound Status: Open Wounding Event: Pressure Injury Comorbid History: Cataracts, Hypertension, Type II Diabetes, Osteoarthritis Date Acquired: 08/18/2022 Weeks Of Treatment: 3 Clustered Wound: No Photos Wound Measurements Length: (cm) 0.1 Width: (cm) 0.1 Depth: (cm) 0.1 Area: (cm) 0.008 Volume: (cm) 0.001 % Reduction in Area: 99.4% % Reduction in Volume: 99.3% Epithelialization: Large (67-100%) Tunneling: No Undermining: No Wound Description Classification: Grade 1 Wound Margin: Distinct, outline attached Exudate Amount: None Present Foul Odor After Cleansing: No Slough/Fibrino No Wound Bed Granulation Amount: None Present (0%) Exposed Structure Necrotic Amount: Large (67-100%) Fascia Exposed: No Necrotic Quality: Eschar Fat Layer (Subcutaneous Tissue) Exposed: No Tendon Exposed: No Muscle Exposed: No Joint Exposed: No Bone Exposed: No Periwound Skin Texture Texture Color No Abnormalities Noted: Yes No Abnormalities Noted: Yes Moisture Temperature / Pain No  Abnormalities Noted: Yes Temperature: No Abnormality Treatment Notes Wound #4 (Calcaneus) Wound Laterality: Right Cleanser Soap and Water Discharge Instruction: May shower and wash wound with dial antibacterial soap and water prior to dressing change. Vashe 5.8 (oz) Discharge Instruction: Cleanse the wound with Vashe prior to applying a clean dressing using gauze sponges, not tissue or cotton balls. Wound Cleanser Discharge Instruction: Cleanse the wound with wound cleanser prior to applying a clean dressing using gauze sponges, not tissue or cotton balls. Peri-Wound Care Sween Lotion (Moisturizing lotion) Discharge Instruction: Apply moisturizing lotion as directed Topical Primary Dressing Maxorb Extra Ag+ Alginate Dressing, 2x2 (in/in) Discharge Instruction: Apply to wound bed as instructed DELLAMAE, NUCHOLS ([id number]) [phone].pdf Page 7 of 7 Secondary Dressing ALLEVYN Heel 4 1/2in x 5 1/2in / 10.5cm x 13.5cm Discharge Instruction: Apply over primary dressing as directed. Woven Gauze Sponge, Non-Sterile 4x4 in Discharge Instruction: Apply over primary dressing as directed. Secured With Transpore Surgical Tape, 2x10 (in/yd) Discharge Instruction: Secure dressing with tape as directed. Compression Wrap Urgo K2 Lite, (equivalent to a 3 layer) two layer compression system, regular Discharge Instruction: Apply Urgo K2 Lite as directed (alternative to 3 layer compression). Compression Stockings Add-Ons Electronic Signature(s) Signed: 10/05/2022 4:05:21 PM By: Samuella Bruin Entered By: Samuella Bruin on 10/05/2022 11:49:43 -------------------------------------------------------------------------------- Vitals Details Patient Name: Date of Service: [name] [name], Kimberly Kimberly B. 10/05/2022 11:45 A M Medical Record Number: [id number] Patient Account Number: [id number] Date of Birth/Sex: Treating RN: 07-04-1932 (89 y.o. [name] Primary Care Romey Mathieson:  [place] Mon Other Clinician: Referring Gay Rape: Treating Oluwatimileyin Vivier/Extender: [name] in Treatment: 3 Vital Signs Time Taken: 11:42 Temperature (F): 98.8 Height (in): 65 Pulse (bpm): 74 Weight (lbs):  140 Respiratory Rate (breaths/min): 18 Body Mass Index (BMI): 23.3 Blood Pressure (mmHg): 138/62 Reference Range: 80 - 120 mg / dl Electronic Signature(s) Signed: 10/05/2022 4:05:21 PM By: Samuella Bruin Entered By: Samuella Bruin on 10/05/2022 11:42:56

## 2022-10-15 ENCOUNTER — Encounter (HOSPITAL_BASED_OUTPATIENT_CLINIC_OR_DEPARTMENT_OTHER): Payer: Medicare Other | Admitting: General Surgery

## 2022-10-15 DIAGNOSIS — E11319 Type 2 diabetes mellitus with unspecified diabetic retinopathy without macular edema: Secondary | ICD-10-CM | POA: Diagnosis not present

## 2022-10-15 DIAGNOSIS — I10 Essential (primary) hypertension: Secondary | ICD-10-CM | POA: Diagnosis not present

## 2022-10-15 DIAGNOSIS — E1140 Type 2 diabetes mellitus with diabetic neuropathy, unspecified: Secondary | ICD-10-CM | POA: Diagnosis not present

## 2022-10-15 DIAGNOSIS — I129 Hypertensive chronic kidney disease with stage 1 through stage 4 chronic kidney disease, or unspecified chronic kidney disease: Secondary | ICD-10-CM | POA: Diagnosis not present

## 2022-10-15 DIAGNOSIS — E1151 Type 2 diabetes mellitus with diabetic peripheral angiopathy without gangrene: Secondary | ICD-10-CM | POA: Diagnosis not present

## 2022-10-15 DIAGNOSIS — I872 Venous insufficiency (chronic) (peripheral): Secondary | ICD-10-CM | POA: Diagnosis not present

## 2022-10-15 DIAGNOSIS — L97412 Non-pressure chronic ulcer of right heel and midfoot with fat layer exposed: Secondary | ICD-10-CM | POA: Diagnosis not present

## 2022-10-15 DIAGNOSIS — E11621 Type 2 diabetes mellitus with foot ulcer: Secondary | ICD-10-CM | POA: Diagnosis not present

## 2022-10-15 DIAGNOSIS — E1122 Type 2 diabetes mellitus with diabetic chronic kidney disease: Secondary | ICD-10-CM | POA: Diagnosis not present

## 2022-10-15 DIAGNOSIS — M1990 Unspecified osteoarthritis, unspecified site: Secondary | ICD-10-CM | POA: Diagnosis not present

## 2022-10-15 DIAGNOSIS — L03115 Cellulitis of right lower limb: Secondary | ICD-10-CM | POA: Diagnosis not present

## 2022-10-15 DIAGNOSIS — L89613 Pressure ulcer of right heel, stage 3: Secondary | ICD-10-CM | POA: Diagnosis not present

## 2022-10-15 DIAGNOSIS — L97811 Non-pressure chronic ulcer of other part of right lower leg limited to breakdown of skin: Secondary | ICD-10-CM | POA: Diagnosis not present

## 2022-10-15 DIAGNOSIS — N189 Chronic kidney disease, unspecified: Secondary | ICD-10-CM | POA: Diagnosis not present

## 2022-10-15 NOTE — Progress Notes (Signed)
[name] ([id number]) [id number] Page 1 of 7 Visit Report for 10/15/2022 Arrival Information Details Patient Name: Date of Service: [name], [name] 10/15/2022 10:45 A M Medical Record Number: [id number] Patient Account Number: [id number] Date of Birth/Sex: Treating RN: June 16, 1932 (89 y.o. F) Primary Care Dempsey Ahonen: [place] Mon Other Clinician: Referring Iolani Twilley: Treating Bethzy Hauck/Extender: [name] in Treatment: 5 Visit Information History Since Last Visit All ordered tests and consults were completed: No Patient Arrived: Ambulatory Added or deleted any medications: No Arrival Time: 10:36 Any new allergies or adverse reactions: No Accompanied By: daughter Had a fall or experienced change in No Transfer Assistance: None activities of daily living that may affect Patient Identification Verified: Yes risk of falls: Secondary Verification Process Completed: Yes Signs or symptoms of abuse/neglect since last visito No Patient Requires Transmission-Based Precautions: No Hospitalized since last visit: No Patient Has Alerts: Yes Implantable device outside of the clinic excluding No Patient Alerts: ABI R 0.87 cellular tissue based products placed in the center since last visit: Pain Present Now: No Electronic Signature(s) Signed: 10/15/2022 3:04:54 PM By: Dayton Scrape Entered By: Dayton Scrape on 10/15/2022 10:37:16 -------------------------------------------------------------------------------- Encounter Discharge Information Details Patient Name: Date of Service: [name] TTIE [name]. 10/15/2022 10:45 A M Medical Record Number: [id number] Patient Account Number: [id number] Date of Birth/Sex: Treating RN: [birth year] (89 y.o. Fredderick Phenix Primary Care Annisten Manchester: [place] Mon Other Clinician: Referring Maryella Abood: Treating Delance Weide/Extender: [name] in Treatment: 5 Encounter Discharge  Information Items Post Procedure Vitals Discharge Condition: Stable Temperature (F): 98.2 Ambulatory Status: Ambulatory Pulse (bpm): 67 Discharge Destination: Home Respiratory Rate (breaths/min): 18 Transportation: Private Auto Blood Pressure (mmHg): 145/79 Accompanied By: granddaughter Schedule Follow-up Appointment: Yes Clinical Summary of Care: Patient Declined Electronic Signature(s) Signed: 10/15/2022 3:50:53 PM By: Samuella Bruin Entered By: Samuella Bruin on 10/15/2022 11:06:36 Busick, Shakura [name] ([id number]) [id number] Page 2 of 7 -------------------------------------------------------------------------------- Lower Extremity Assessment Details Patient Name: Date of Service: [name], [name] 10/15/2022 10:45 A M Medical Record Number: [id number] Patient Account Number: [id number] Date of Birth/Sex: Treating RN: 04/01/1933 (89 y.o. Fredderick Phenix Primary Care Keano Guggenheim: [place] Mon Other Clinician: Referring Derin Granquist: Treating Summer Parthasarathy/Extender: Eloy End Weeks in Treatment: 5 Edema Assessment Assessed: [Left: No] [Right: No] [Left: Edema] [Right: :] Calf Left: Right: Point of Measurement: 33 cm From Medial Instep 29.4 cm Ankle Left: Right: Point of Measurement: 11 cm From Medial Instep 18.5 cm Electronic Signature(s) Signed: 10/15/2022 3:50:53 PM By: Samuella Bruin Entered By: Samuella Bruin on 10/15/2022 10:49:35 -------------------------------------------------------------------------------- Multi Wound Chart Details Patient Name: Date of Service: [name] [name], MA TTIE [name]. 10/15/2022 10:45 A M Medical Record Number: [id number] Patient Account Number: [id number] Date of Birth/Sex: Treating RN: 08/14/1932 (89 y.o. F) Primary Care Thatcher Doberstein: [place] Mon Other Clinician: Referring Xcaret Morad: Treating Berea Majkowski/Extender: [name] in Treatment: 5 Vital Signs Height(in):  65 Pulse(bpm): 67 Weight(lbs): 140 Blood Pressure(mmHg): 145/79 Body Mass Index(BMI): 23.3 Temperature(F): 98.2 Respiratory Rate(breaths/min): 18 [4:Photos:] [N/A:N/A] Right Calcaneus N/A N/A Wound Location: Pressure Injury N/A N/A Wounding Event: Diabetic Wound/Ulcer of the Lower N/A N/A Primary Etiology: Extremity Cataracts, Hypertension, Type II N/A N/A Comorbid History: Diabetes, Osteoarthritis [name] ([id number]) [id number] Page 3 of 7 08/18/2022 N/A N/A Date Acquired: 5 N/A N/A Weeks of Treatment: Open N/A N/A Wound Status: No N/A N/A Wound Recurrence: 0.1x0.1x0.1 N/A N/A Measurements L x W x D (cm) 0.008 N/A N/A A (  cm) : rea 0.001 N/A N/A Volume (cm) : 99.40% N/A N/A % Reduction in A rea: 99.30% N/A N/A % Reduction in Volume: Grade 1 N/A N/A Classification: None Present N/A N/A Exudate A mount: Distinct, outline attached N/A N/A Wound Margin: None Present (0%) N/A N/A Granulation A mount: Large (67-100%) N/A N/A Necrotic A mount: Eschar N/A N/A Necrotic Tissue: Fascia: No N/A N/A Exposed Structures: Fat Layer (Subcutaneous Tissue): No Tendon: No Muscle: No Joint: No Bone: No Limited to Skin Breakdown Large (67-100%) N/A N/A Epithelialization: Debridement - Selective/Open Wound N/A N/A Debridement: Pre-procedure Verification/Time Out 10:52 N/A N/A Taken: Lidocaine 4% Topical Solution N/A N/A Pain Control: Necrotic/Eschar, Slough N/A N/A Tissue Debrided: Non-Viable Tissue N/A N/A Level: 0.01 N/A N/A Debridement A (sq cm): rea Curette N/A N/A Instrument: Minimum N/A N/A Bleeding: Pressure N/A N/A Hemostasis A chieved: Procedure was tolerated well N/A N/A Debridement Treatment Response: 0.1x0.1x0.1 N/A N/A Post Debridement Measurements L x W x D (cm) 0.001 N/A N/A Post Debridement Volume: (cm) No Abnormalities Noted N/A N/A Periwound Skin Texture: No Abnormalities Noted N/A N/A Periwound Skin  Moisture: No Abnormalities Noted N/A N/A Periwound Skin Color: No Abnormality N/A N/A Temperature: Debridement N/A N/A Procedures Performed: Treatment Notes Electronic Signature(s) Signed: 10/15/2022 10:56:39 AM By: Duanne Guess MD FACS Entered By: Duanne Guess on 10/15/2022 10:56:39 -------------------------------------------------------------------------------- Multi-Disciplinary Care Plan Details Patient Name: Date of Service: [name] [name], MA TTIE [name]. 10/15/2022 10:45 A M Medical Record Number: [id number] Patient Account Number: [id number] Date of Birth/Sex: Treating RN: 09-30-1932 (89 y.o. Fredderick Phenix Primary Care Kassidee Narciso: [place] Mon Other Clinician: Referring Baker Kogler: Treating Raynette Arras/Extender: [name] in Treatment: 5 Active Inactive Wound/Skin Impairment Nursing Diagnoses: Impaired tissue integrity Goals: Patient/caregiver will verbalize understanding of skin care regimen Date Initiated: 09/10/2022 Target Resolution Date: 12/18/2022 Goal Status: Active TYEISHA, PAUTSCH ([id number]) [id number] Page 4 of 7 Interventions: Assess ulceration(s) every visit Treatment Activities: Skin care regimen initiated : 09/10/2022 Notes: Electronic Signature(s) Signed: 10/15/2022 3:50:53 PM By: Samuella Bruin Entered By: Samuella Bruin on 10/15/2022 11:05:39 -------------------------------------------------------------------------------- Pain Assessment Details Patient Name: Date of Service: [name] [name], MA TTIE [name]. 10/15/2022 10:45 A M Medical Record Number: [id number] Patient Account Number: [id number] Date of Birth/Sex: Treating RN: March 19, 1933 (89 y.o. F) Primary Care Sayda Grable: [place] Mon Other Clinician: Referring Field Staniszewski: Treating Devlin Mcveigh/Extender: [name] in Treatment: 5 Active Problems Location of Pain Severity and Description of Pain Patient Has Paino No Site  Locations Pain Management and Medication Current Pain Management: Electronic Signature(s) Signed: 10/15/2022 3:04:54 PM By: Dayton Scrape Entered By: Dayton Scrape on 10/15/2022 10:37:41 -------------------------------------------------------------------------------- Patient/Caregiver Education Details Patient Name: Date of Service: [name] [name], [name] 6/28/2024andnbsp10:45 A M Medical Record Number: [id number] Patient Account Number: [id number] Date of Birth/Gender: Treating RN: 1932/10/27 (89 y.o. Fredderick Phenix Primary Care Physician: [place] Mon Other Clinician: TAMME, HOMEWOOD ([id number]) [id number] Page 5 of 7 Referring Physician: Treating Physician/Extender: [name] in Treatment: 5 Education Assessment Education Provided To: Patient Education Topics Provided Wound/Skin Impairment: Methods: Explain/Verbal Responses: Reinforcements needed, State content correctly Electronic Signature(s) Signed: 10/15/2022 3:50:53 PM By: Samuella Bruin Entered By: Samuella Bruin on 10/15/2022 11:05:50 -------------------------------------------------------------------------------- Wound Assessment Details Patient Name: Date of Service: [name] [name], MA TTIE [name]. 10/15/2022 10:45 A M Medical Record Number: [id number] Patient Account Number: [id number] Date of Birth/Sex: Treating RN: July 05, 1932 (89 y.o. F) Primary Care Wynetta Seith: [place] Mon Other Clinician: Referring Enzio Buchler: Treating Kelley Polinsky/Extender: Lady Gary  Perlie Mayo, Kingsley Callander Weeks in Treatment: 5 Wound Status Wound Number: 4 Primary Etiology: Diabetic Wound/Ulcer of the Lower Extremity Wound Location: Right Calcaneus Wound Status: Open Wounding Event: Pressure Injury Comorbid History: Cataracts, Hypertension, Type II Diabetes, Osteoarthritis Date Acquired: 08/18/2022 Weeks Of Treatment: 5 Clustered Wound: No Photos Wound Measurements Length: (cm) 0.1 Width: (cm)  0.1 Depth: (cm) 0.1 Area: (cm) 0.008 Volume: (cm) 0.001 % Reduction in Area: 99.4% % Reduction in Volume: 99.3% Epithelialization: Large (67-100%) Tunneling: No Undermining: No Wound Description Classification: Grade 1 Wound Margin: Distinct, outline attached Exudate Amount: None Present Foul Odor After Cleansing: No Slough/Fibrino No Wound Bed Granulation Amount: None Present (0%) Exposed FELIPE, ELMENDORF [name] ([id number]) [id number] Page 6 of 7 Necrotic Amount: Large (67-100%) Fascia Exposed: No Necrotic Quality: Eschar Fat Layer (Subcutaneous Tissue) Exposed: No Tendon Exposed: No Muscle Exposed: No Joint Exposed: No Bone Exposed: No Limited to Skin Breakdown Periwound Skin Texture Texture Color No Abnormalities Noted: Yes No Abnormalities Noted: Yes Moisture Temperature / Pain No Abnormalities Noted: Yes Temperature: No Abnormality Treatment Notes Wound #4 (Calcaneus) Wound Laterality: Right Cleanser Soap and Water Discharge Instruction: May shower and wash wound with dial antibacterial soap and water prior to dressing change. Vashe 5.8 (oz) Discharge Instruction: Cleanse the wound with Vashe prior to applying a clean dressing using gauze sponges, not tissue or cotton balls. Wound Cleanser Discharge Instruction: Cleanse the wound with wound cleanser prior to applying a clean dressing using gauze sponges, not tissue or cotton balls. Peri-Wound Care Sween Lotion (Moisturizing lotion) Discharge Instruction: Apply moisturizing lotion as directed Topical Primary Dressing Maxorb Extra Ag+ Alginate Dressing, 2x2 (in/in) Discharge Instruction: Apply to wound bed as instructed Secondary Dressing ALLEVYN Heel 4 1/2in x 5 1/2in / 10.5cm x 13.5cm Discharge Instruction: Apply over primary dressing as directed. Woven Gauze Sponge, Non-Sterile 4x4 in Discharge Instruction: Apply over primary dressing as directed. Secured With Transpore  Surgical Tape, 2x10 (in/yd) Discharge Instruction: Secure dressing with tape as directed. Compression Wrap Urgo K2 Lite, (equivalent to a 3 layer) two layer compression system, regular Discharge Instruction: Apply Urgo K2 Lite as directed (alternative to 3 layer compression). Compression Stockings Add-Ons Electronic Signature(s) Signed: 10/15/2022 3:50:53 PM By: Samuella Bruin Entered By: Samuella Bruin on 10/15/2022 10:53:36 -------------------------------------------------------------------------------- Vitals Details Patient Name: Date of Service: [name] [name], MA TTIE [name]. 10/15/2022 10:45 A M Medical Record Number: [id number] Patient Account Number: [id number] Date of Birth/Sex: Treating RN: [birth year] (89 y.o. F) Primary Care Waynesha Rammel: [place] Mon Other Clinician: Referring Jmichael Gille: Treating Janann Boeve/Extender: [name] in Treatment: [address] Mishaal [name] ([id number]) [id number] Page 7 of 7 Vital Signs Time Taken: 10:37 Temperature (F): 98.2 Height (in): 65 Pulse (bpm): 67 Weight (lbs): 140 Respiratory Rate (breaths/min): 18 Body Mass Index (BMI): 23.3 Blood Pressure (mmHg): 145/79 Reference Range: 80 - 120 mg / dl Electronic Signature(s) Signed: 10/15/2022 3:04:54 PM By: Dayton Scrape Entered By: Dayton Scrape on 10/15/2022 10:37:36

## 2022-10-15 NOTE — Progress Notes (Signed)
[name], [name] ([id number]) [id number] Page 1 of 9 Visit Report for 10/15/2022 Chief Complaint Document Details Patient Name: Date of Service: [place] [name], [name] 10/15/2022 10:45 A M Medical Record Number: [id number] Patient Account Number: [id number] Date of Birth/Sex: Treating RN: 1933-01-03 (89 y.o. F) Primary Care Provider: Cranford Mon Other Clinician: Referring Provider: Treating Provider/Extender: [name] in Treatment: 5 Information Obtained from: Patient Chief Complaint Patient presents for treatment of multiple open ulcers due to suspected venous insufficiency and a gluteal pressure ulcer 09/10/2022: pressure ulcer of right heel Electronic Signature(s) Signed: 10/15/2022 10:56:51 AM By: Duanne Guess MD FACS Entered By: Duanne Guess on 10/15/2022 10:56:51 -------------------------------------------------------------------------------- Debridement Details Patient Name: Date of Service: Kimberly Kirk [name], [name]. 10/15/2022 10:45 A M Medical Record Number: [id number] Patient Account Number: [id number] Date of Birth/Sex: Treating RN: Feb 19, 1933 (89 y.o. [name] Primary Care Provider: Cranford Mon Other Clinician: Referring Provider: Treating Provider/Extender: [name] in Treatment: 5 Debridement Performed for Assessment: Wound #4 Right Calcaneus Performed By: Physician Duanne Guess, MD Debridement Type: Debridement Severity of Tissue Pre Debridement: Limited to breakdown of skin Level of Consciousness (Pre-procedure): Awake and Alert Pre-procedure Verification/Time Out Yes - 10:52 Taken: Start Time: 10:52 Pain Control: Lidocaine 4% Topical Solution Percent of Wound Bed Debrided: 100% T Area Debrided (cm): otal 0.01 Tissue and other material debrided: Non-Viable, Eschar, Slough, Slough Level: Non-Viable Tissue Debridement Description: Selective/Open Wound Instrument:  Curette Bleeding: Minimum Hemostasis Achieved: Pressure Response to Treatment: Procedure was tolerated well Level of Consciousness (Post- Awake and Alert procedure): Post Debridement Measurements of Total Wound Length: (cm) 0.1 Width: (cm) 0.1 Depth: (cm) 0.1 Volume: (cm) 0.001 Character of Wound/Ulcer Post Debridement: Improved Severity of Tissue Post Debridement: Limited to breakdown of skin [name], [name] ([id number]) [id number] Page 2 of 9 Post Procedure Diagnosis Same as Pre-procedure Notes scribed for Dr. Lady Gary by Samuella Bruin, RN Electronic Signature(s) Signed: 10/15/2022 11:24:38 AM By: Duanne Guess MD FACS Signed: 10/15/2022 3:50:53 PM By: Samuella Bruin Entered By: Samuella Bruin on 10/15/2022 11:04:52 -------------------------------------------------------------------------------- HPI Details Patient Name: Date of Service: Kimberly Kirk [name], [name]. 10/15/2022 10:45 A M Medical Record Number: [id number] Patient Account Number: [id number] Date of Birth/Sex: Treating RN: 1932/08/20 (89 y.o. F) Primary Care Provider: Cranford Mon Other Clinician: Referring Provider: Treating Provider/Extender: [name] in Treatment: 5 History of Present Illness HPI Description: ADMISSION 04/29/2022 This is an 89 year old type II diabetic (no hemoglobin A1c available for review) who also has hypertension, neuropathy, peripheral angiopathy, chronic kidney disease, among other medical comorbidities. Her history is somewhat difficult to put together as she is extremely hard of hearing and according to her son, who accompanies her today, she does not reliably see physicians. There are apparently some concerns for self-neglect, but the family dynamics are complicated. She has 2+ pitting edema bilaterally with bilateral erythema suggestive of stasis dermatitis. What little outside information was sent ahead indicates that she was  thought to potentially have cellulitis and is she is currently taking Keflex for this. She also has open wounds on her right lower extremity on both the anterior, posterior, and medial aspects. They vary in depth but all have slough and nonviable subcutaneous tissue present. She has a small pressure ulcer on her left gluteus, just adjacent to the natal cleft. 05/06/2022: The anterior lower leg wound is healed. The posterior and medial lower leg wounds have accumulated a substantial amount of slough. Edema  control bilaterally is markedly improved from last week. The pressure ulcer on her left buttock is smaller and quite clean with just a small amount of slough and eschar present. 05/14/2022: The gluteal ulcer is healed. The ulcers on her posterior and medial lower leg are cleaner but still with a fair amount of slough buildup. They are less tender than on prior visits. Edema control bilaterally is good. 05/24/2022: The ulcers on her right lower leg are smaller and cleaner again today. Significantly less slough accumulation. Edema control is good. 06/04/2022: All of her wounds are nearly healed. The remaining open areas are small and superficial with just a little slough and eschar present. Edema control is excellent. 06/15/2022: Her wounds are healed. READMISSION 09/10/2022: She returns today with a stage III pressure ulcer on the right heel. She is clearly been adhering to my recommendation that she keep her feet elevated, but she has been propping her foot up on an ottoman and as a result, has had tissue breakdown at the posterior calcaneus. She has been wearing a bunny boot type slipper in bed at night, but this has caused an abrasion on her medial right lower leg. There is slough on the heel; at the ankle wound is clean. 09/17/2022: The ankle wound has healed. The heel ulcer is measuring smaller and has just a small amount of slough on the surface. Edema control is good. 6/7; patient has a pressure  ulcer on the right lateral heel. She comes in today complaining of increasing pain with our intake nurse noting purulent drainage when she would change the dressing. The patient has a temperature of 99.1 otherwise her vitals are stable. Because of her hearing loss it is hard to really have a conversation although it is fairly reliable that she thinks the pain is more intense 10/05/2022: There is a large thick layer of eschar overlying her wound. Underneath, it is about half a centimeter in diameter, very superficial, and clean. 10/15/2022: There is a layer of thin eschar overlying the wound. Underneath, there are just a couple of millimeters of residual opening. Electronic Signature(s) Signed: 10/15/2022 10:57:38 AM By: Duanne Guess MD FACS Entered By: Duanne Guess on 10/15/2022 10:57:38 [name] ([id number]) [id number] Page 3 of 9 -------------------------------------------------------------------------------- Physical Exam Details Patient Name: Date of Service: [name], [name]. 10/15/2022 10:45 A M Medical Record Number: [id number] Patient Account Number: [id number] Date of Birth/Sex: Treating RN: Aug 19, 1932 (89 y.o. F) Primary Care Provider: Cranford Mon Other Clinician: Referring Provider: Treating Provider/Extender: [name] in Treatment: 5 Constitutional Slightly hypertensive. . . . no acute distress. Respiratory Normal work of breathing on room air. Notes There is a layer of thin eschar overlying the wound. Underneath, there are just a couple of millimeters of residual opening. Electronic Signature(s) Signed: 10/15/2022 10:58:18 AM By: Duanne Guess MD FACS Entered By: Duanne Guess on 10/15/2022 10:58:18 -------------------------------------------------------------------------------- Physician Orders Details Patient Name: Date of Service: Kimberly Kirk [name], [name]. 10/15/2022 10:45 A M Medical Record Number:  [id number] Patient Account Number: [id number] Date of Birth/Sex: Treating RN: 1932-11-27 (89 y.o. [name] Primary Care Provider: Cranford Mon Other Clinician: Referring Provider: Treating Provider/Extender: [name] in Treatment: 5 Verbal / Phone Orders: No Diagnosis Coding ICD-10 Coding Code Description [phone] Pressure ulcer of right heel, stage 3 I87.2 Venous insufficiency (chronic) (peripheral) E11.621 Type 2 diabetes mellitus with foot ulcer Follow-up Appointments ppointment in 1 week. - Dr. Lady Gary Room 3  Return A Anesthetic Wound #4 Right Calcaneus (In clinic) Topical Lidocaine 4% applied to wound bed Bathing/ Shower/ Hygiene Other Bathing/Shower/Hygiene Orders/Instructions: - May sponge bathe or shower with cast protector on right leg Off-Loading Wound #4 Right Calcaneus Prevalon Boot Other: - Place pillow behind calf, to prop leg up. Wound Treatment Wound #4 - Calcaneus Wound Laterality: Right Cleanser: Soap and Water 1 x Per Week/30 Days [name], [name] ([id number]) [id number] Page 4 of 9 Discharge Instructions: May shower and wash wound with dial antibacterial soap and water prior to dressing change. Cleanser: Vashe 5.8 (oz) 1 x Per Week/30 Days Discharge Instructions: Cleanse the wound with Vashe prior to applying a clean dressing using gauze sponges, not tissue or cotton balls. Cleanser: Wound Cleanser 1 x Per Week/30 Days Discharge Instructions: Cleanse the wound with wound cleanser prior to applying a clean dressing using gauze sponges, not tissue or cotton balls. Peri-Wound Care: Sween Lotion (Moisturizing lotion) 1 x Per Week/30 Days Discharge Instructions: Apply moisturizing lotion as directed Prim Dressing: Maxorb Extra Ag+ Alginate Dressing, 2x2 (in/in) 1 x Per Week/30 Days ary Discharge Instructions: Apply to wound bed as instructed Secondary Dressing: ALLEVYN Heel 4 1/2in x 5 1/2in /  10.5cm x 13.5cm 1 x Per Week/30 Days Discharge Instructions: Apply over primary dressing as directed. Secondary Dressing: Woven Gauze Sponge, Non-Sterile 4x4 in 1 x Per Week/30 Days Discharge Instructions: Apply over primary dressing as directed. Secured With: Transpore Surgical Tape, 2x10 (in/yd) 1 x Per Week/30 Days Discharge Instructions: Secure dressing with tape as directed. Compression Wrap: Urgo K2 Lite, (equivalent to a 3 layer) two layer compression system, regular 1 x Per Week/30 Days Discharge Instructions: Apply Urgo K2 Lite as directed (alternative to 3 layer compression). Patient Medications llergies: No Known Allergies A Notifications Medication Indication Start End 10/15/2022 lidocaine DOSE topical 4 % cream - cream topical Electronic Signature(s) Signed: 10/15/2022 11:24:38 AM By: Duanne Guess MD FACS Previous Signature: 10/15/2022 10:58:26 AM Version By: Duanne Guess MD FACS Entered By: Duanne Guess on 10/15/2022 11:10:47 -------------------------------------------------------------------------------- Problem List Details Patient Name: Date of Service: Kimberly Kirk [name], [name]. 10/15/2022 10:45 A M Medical Record Number: [id number] Patient Account Number: [id number] Date of Birth/Sex: Treating RN: May 16, 1932 (89 y.o. F) Primary Care Provider: Cranford Mon Other Clinician: Referring Provider: Treating Provider/Extender: [name] in Treatment: 5 Active Problems ICD-10 Encounter Code Description Active Date MDM Diagnosis L89.613 Pressure ulcer of right heel, stage 3 09/10/2022 No Yes I87.2 Venous insufficiency (chronic) (peripheral) 09/10/2022 No Yes E11.621 Type 2 diabetes mellitus with foot ulcer 09/10/2022 No Yes [name], [name] ([id number]) [id number] Page 5 of 9 Inactive Problems Resolved Problems ICD-10 Code Description Active Date Resolved Date L97.811 Non-pressure chronic ulcer of other part of  right lower leg limited to breakdown of skin 09/10/2022 09/10/2022 Electronic Signature(s) Signed: 10/15/2022 10:56:33 AM By: Duanne Guess MD FACS Entered By: Duanne Guess on 10/15/2022 10:56:33 -------------------------------------------------------------------------------- Progress Note Details Patient Name: Date of Service: Kimberly [name], [name]. 10/15/2022 10:45 A M Medical Record Number: [id number] Patient Account Number: [id number] Date of Birth/Sex: Treating RN: May 24, 1932 (89 y.o. F) Primary Care Provider: Cranford Mon Other Clinician: Referring Provider: Treating Provider/Extender: [name] in Treatment: 5 Subjective Chief Complaint Information obtained from Patient Patient presents for treatment of multiple open ulcers due to suspected venous insufficiency and a gluteal pressure ulcer 09/10/2022: pressure ulcer of right heel History of Present Illness (HPI) ADMISSION 04/29/2022 This is an 89 year old type  II diabetic (no hemoglobin A1c available for review) who also has hypertension, neuropathy, peripheral angiopathy, chronic kidney disease, among other medical comorbidities. Her history is somewhat difficult to put together as she is extremely hard of hearing and according to her son, who accompanies her today, she does not reliably see physicians. There are apparently some concerns for self-neglect, but the family dynamics are complicated. She has 2+ pitting edema bilaterally with bilateral erythema suggestive of stasis dermatitis. What little outside information was sent ahead indicates that she was thought to potentially have cellulitis and is she is currently taking Keflex for this. She also has open wounds on her right lower extremity on both the anterior, posterior, and medial aspects. They vary in depth but all have slough and nonviable subcutaneous tissue present. She has a small pressure ulcer on her left gluteus, just adjacent to the natal  cleft. 05/06/2022: The anterior lower leg wound is healed. The posterior and medial lower leg wounds have accumulated a substantial amount of slough. Edema control bilaterally is markedly improved from last week. The pressure ulcer on her left buttock is smaller and quite clean with just a small amount of slough and eschar present. 05/14/2022: The gluteal ulcer is healed. The ulcers on her posterior and medial lower leg are cleaner but still with a fair amount of slough buildup. They are less tender than on prior visits. Edema control bilaterally is good. 05/24/2022: The ulcers on her right lower leg are smaller and cleaner again today. Significantly less slough accumulation. Edema control is good. 06/04/2022: All of her wounds are nearly healed. The remaining open areas are small and superficial with just a little slough and eschar present. Edema control is excellent. 06/15/2022: Her wounds are healed. READMISSION 09/10/2022: She returns today with a stage III pressure ulcer on the right heel. She is clearly been adhering to my recommendation that she keep her feet elevated, but she has been propping her foot up on an ottoman and as a result, has had tissue breakdown at the posterior calcaneus. She has been wearing a bunny boot type slipper in bed at night, but this has caused an abrasion on her medial right lower leg. There is slough on the heel; at the ankle wound is clean. 09/17/2022: The ankle wound has healed. The heel ulcer is measuring smaller and has just a small amount of slough on the surface. Edema control is good. 6/7; patient has a pressure ulcer on the right lateral heel. She comes in today complaining of increasing pain with our intake nurse noting purulent drainage when she would change the dressing. The patient has a temperature of 99.1 otherwise her vitals are stable. Because of her hearing loss it is hard to really have a conversation although it is fairly reliable that she thinks the  pain is more intense 10/05/2022: There is a large thick layer of eschar overlying her wound. Underneath, it is about half a centimeter in diameter, very superficial, and clean. 10/15/2022: There is a layer of thin eschar overlying the wound. Underneath, there are just a couple of millimeters of residual opening. Patient History Information obtained from Patient. ANJALIE, AMBROISE ([id number]) [id number] Page 6 of 9 Family History Unknown History. Social History Never smoker, Marital Status - Widowed, Alcohol Use - Never, Drug Use - No History, Caffeine Use - Never. Medical History Eyes Patient has history of Cataracts Cardiovascular Patient has history of Hypertension Endocrine Patient has history of Type II Diabetes Musculoskeletal Patient has history of Osteoarthritis  Hospitalization/Surgery History - cataract extraction. - rectocele repair. - abdominal hysterectomy. - breast surgery. - c-eye surgery procedure. - eye surgery. Medical A Surgical History Notes nd Eyes diabetic retinopathy, macular degeneration Objective Constitutional Slightly hypertensive. no acute distress. Vitals Time Taken: 10:37 AM, Height: 65 in, Weight: 140 lbs, BMI: 23.3, Temperature: 98.2 F, Pulse: 67 bpm, Respiratory Rate: 18 breaths/min, Blood Pressure: 145/79 mmHg. Respiratory Normal work of breathing on room air. General Notes: There is a layer of thin eschar overlying the wound. Underneath, there are just a couple of millimeters of residual opening. Integumentary (Hair, Skin) Wound #4 status is Open. Original cause of wound was Pressure Injury. The date acquired was: 08/18/2022. The wound has been in treatment 5 [name]. The wound is located on the Right Calcaneus. The wound measures 0.1cm length x 0.1cm width x 0.1cm depth; 0.008cm^2 area and 0.001cm^3 volume. The wound is limited to skin breakdown. There is no tunneling or undermining noted. There is a none present amount of  drainage noted. The wound margin is distinct with the outline attached to the wound base. There is no granulation within the wound bed. There is a large (67-100%) amount of necrotic tissue within the wound bed including Eschar. The periwound skin appearance had no abnormalities noted for texture. The periwound skin appearance had no abnormalities noted for moisture. The periwound skin appearance had no abnormalities noted for color. Periwound temperature was noted as No Abnormality. Assessment Active Problems ICD-10 Pressure ulcer of right heel, stage 3 Venous insufficiency (chronic) (peripheral) Type 2 diabetes mellitus with foot ulcer Procedures Wound #4 Pre-procedure diagnosis of Wound #4 is a Diabetic Wound/Ulcer of the Lower Extremity located on the Right Calcaneus .Severity of Tissue Pre Debridement is: Fat layer exposed. There was a Selective/Open Wound Non-Viable Tissue Debridement with a total area of 0.01 sq cm performed by Duanne Guess, MD. With the following instrument(s): Curette to remove Non-Viable tissue/material. Material removed includes Eschar and Slough and after achieving pain control using Lidocaine 4% T opical Solution. No specimens were taken. A time out was conducted at 10:52, prior to the start of the procedure. A Minimum amount of bleeding was controlled with Pressure. The procedure was tolerated well. Post Debridement Measurements: 0.1cm length x 0.1cm width x 0.1cm depth; 0.001cm^3 volume. Character of Wound/Ulcer Post Debridement is improved. Severity of Tissue Post Debridement is: Fat layer exposed. Post procedure Diagnosis Wound #4: Same as Pre-Procedure General Notes: scribed for Dr. Lady Gary by Samuella Bruin, RN. [name], [name] ([id number]) [id number] Page 7 of 9 Plan Follow-up Appointments: Return Appointment in 1 week. - Dr. Lady Gary Room 3 Anesthetic: Wound #4 Right Calcaneus: (In clinic) Topical Lidocaine 4% applied to  wound bed Bathing/ Shower/ Hygiene: Other Bathing/Shower/Hygiene Orders/Instructions: - May sponge bathe or shower with cast protector on right leg Off-Loading: Wound #4 Right Calcaneus: Prevalon Boot Other: - Place pillow behind calf, to prop leg up. The following medication(s) was prescribed: lidocaine topical 4 % cream cream topical was prescribed at facility WOUND #4: - Calcaneus Wound Laterality: Right Cleanser: Soap and Water 1 x Per Week/30 Days Discharge Instructions: May shower and wash wound with dial antibacterial soap and water prior to dressing change. Cleanser: Vashe 5.8 (oz) 1 x Per Week/30 Days Discharge Instructions: Cleanse the wound with Vashe prior to applying a clean dressing using gauze sponges, not tissue or cotton balls. Cleanser: Wound Cleanser 1 x Per Week/30 Days Discharge Instructions: Cleanse the wound with wound cleanser prior to applying a clean dressing using gauze sponges,  not tissue or cotton balls. Peri-Wound Care: Sween Lotion (Moisturizing lotion) 1 x Per Week/30 Days Discharge Instructions: Apply moisturizing lotion as directed Prim Dressing: Maxorb Extra Ag+ Alginate Dressing, 2x2 (in/in) 1 x Per Week/30 Days ary Discharge Instructions: Apply to wound bed as instructed Secondary Dressing: ALLEVYN Heel 4 1/2in x 5 1/2in / 10.5cm x 13.5cm 1 x Per Week/30 Days Discharge Instructions: Apply over primary dressing as directed. Secondary Dressing: Woven Gauze Sponge, Non-Sterile 4x4 in 1 x Per Week/30 Days Discharge Instructions: Apply over primary dressing as directed. Secured With: Transpore Surgical T ape, 2x10 (in/yd) 1 x Per Week/30 Days Discharge Instructions: Secure dressing with tape as directed. Com pression Wrap: Urgo K2 Lite, (equivalent to a 3 layer) two layer compression system, regular 1 x Per Week/30 Days Discharge Instructions: Apply Urgo K2 Lite as directed (alternative to 3 layer compression). 10/15/2022: There is a layer of thin eschar  overlying the wound. Underneath, there are just a couple of millimeters of residual opening. I used a curette to debride the eschar and a little bit of slough from her wound. We will continue the silver alginate and foam heel cup with aggressive offloading. She will follow-up in 1 week, at which time hopefully she will be completely healed. Electronic Signature(s) Signed: 10/15/2022 11:11:17 AM By: Duanne Guess MD FACS Previous Signature: 10/15/2022 11:10:32 AM Version By: Duanne Guess MD FACS Previous Signature: 10/15/2022 10:58:48 AM Version By: Duanne Guess MD FACS Entered By: Duanne Guess on 10/15/2022 11:11:17 -------------------------------------------------------------------------------- HxROS Details Patient Name: Date of Service: Kimberly [name], [name]. 10/15/2022 10:45 A M Medical Record Number: [id number] Patient Account Number: [id number] Date of Birth/Sex: Treating RN: 30-Aug-1932 (89 y.o. F) Primary Care Provider: Cranford Mon Other Clinician: Referring Provider: Treating Provider/Extender: [name] in Treatment: 5 Information Obtained From Patient Eyes Medical History: Positive for: Cataracts Past Medical History Notes: diabetic retinopathy, macular degeneration Cardiovascular Medical History: Positive for: Hypertension [name], SCHOUTEN ([id number]) [id number] Page 8 of 9 Endocrine Medical History: Positive for: Type II Diabetes Time with diabetes: 45 yrs Treated with: Insulin Blood sugar tested every day: Yes Tested : Musculoskeletal Medical History: Positive for: Osteoarthritis HBO Extended History Items Eyes: Cataracts Immunizations Pneumococcal Vaccine: Received Pneumococcal Vaccination: No Implantable Devices None Hospitalization / Surgery History Type of Hospitalization/Surgery cataract extraction rectocele repair abdominal hysterectomy breast surgery c-eye surgery procedure eye  surgery Family and Social History Unknown History: Yes; Never smoker; Marital Status - Widowed; Alcohol Use: Never; Drug Use: No History; Caffeine Use: Never; Financial Concerns: No; Food, Clothing or Shelter Needs: No; Support System Lacking: No; Transportation Concerns: No Electronic Signature(s) Signed: 10/15/2022 11:24:38 AM By: Duanne Guess MD FACS Entered By: Duanne Guess on 10/15/2022 10:57:48 -------------------------------------------------------------------------------- SuperBill Details Patient Name: Date of Service: [name], [name]. 10/15/2022 Medical Record Number: [id number] Patient Account Number: [id number] Date of Birth/Sex: Treating RN: 16-Mar-1933 (89 y.o. F) Primary Care Provider: Cranford Mon Other Clinician: Referring Provider: Treating Provider/Extender: [name] in Treatment: 5 Diagnosis Coding ICD-10 Codes Code Description [phone] Pressure ulcer of right heel, stage 3 I87.2 Venous insufficiency (chronic) (peripheral) E11.621 Type 2 diabetes mellitus with foot ulcer Facility Procedures : [place], Kimberly ION6 Code: [id number] [name] ([id number]) L8 Description: 97597 - DEBRIDE WOUND 1ST 20 SQ CM OR < ICD-10 Diagnosis Description [id number] 9.613 Pressure ulcer of right heel, stage 3 Modifier: [id number]. Quantity: 1 pdf Page 9 of 9 Physician Procedures : CPT4 Code Description Modifier [phone] -  WC PHYS LEVEL 3 - EST PT 25 ICD-10 Diagnosis Description L89.613 Pressure ulcer of right heel, stage 3 I87.2 Venous insufficiency (chronic) (peripheral) Quantity: 1 : [id number] 97597 - WC PHYS DEBR WO ANESTH 20 SQ CM ICD-10 Diagnosis Description L89.613 Pressure ulcer of right heel, stage 3 Quantity: 1 Electronic Signature(s) Signed: 10/15/2022 11:11:34 AM By: Duanne Guess MD FACS Entered By: Duanne Guess on 10/15/2022 11:11:33

## 2022-10-18 NOTE — Progress Notes (Signed)
[name] [name] ([id number]) [id number] Page 1 of 10 Visit Report for 09/10/2022 Chief Complaint Document Details Patient Name: Date of Service: [name] [name] [name] [name] 09/10/2022 1:15 PM Medical Record Number: [id number] Patient Account Number: [id number] Date of Birth/Sex: Treating RN: Oct 29, 1932 (89 y.o. F) Primary Care Provider: Cranford Mon Other Clinician: Referring Provider: Treating Provider/Extender: [name] in Treatment: 0 Information Obtained from: Patient Chief Complaint Patient presents for treatment of multiple open ulcers due to suspected venous insufficiency and a gluteal pressure ulcer 09/10/2022: pressure ulcer of right heel Electronic Signature(s) Signed: 09/10/2022 2:20:46 PM By: Duanne Guess MD FACS Entered By: Duanne Guess on 09/10/2022 14:20:46 -------------------------------------------------------------------------------- Debridement Details Patient Name: Date of Service: [name] [name]. 09/10/2022 1:15 PM Medical Record Number: [id number] Patient Account Number: [id number] Date of Birth/Sex: Treating RN: Jan 25, 1933 (89 y.o. [name] [name] Primary Care Provider: Cranford Mon Other Clinician: Referring Provider: Treating Provider/Extender: [name] in Treatment: 0 Debridement Performed for Assessment: Wound #4 Right Calcaneus Performed By: Physician Duanne Guess, MD Debridement Type: Debridement Severity of Tissue Pre Debridement: Fat layer exposed Level of Consciousness (Pre-procedure): Awake and Alert Pre-procedure Verification/Time Out Yes - 14:03 Taken: Start Time: 14:03 Pain Control: Lidocaine 5% topical ointment Percent of Wound Bed Debrided: 100% T Area Debrided (cm): otal 1.41 Tissue and other material debrided: Non-Viable, Slough, Slough Level: Non-Viable Tissue Debridement Description: Selective/Open Wound Instrument: Curette Bleeding:  Minimum Hemostasis Achieved: Pressure End Time: 14:04 Procedural Pain: 0 Post Procedural Pain: 0 Response to Treatment: Procedure was tolerated well Level of Consciousness (Post- Responds to Painful Stimuli procedure): Post Debridement Measurements of Total Wound Length: (cm) 1 Width: (cm) 1.8 Depth: (cm) 0.1 Volume: (cm) 0.141 [name] [name], [name] [name] ([id number]) [id number].pdf Page 2 of 10 Character of Wound/Ulcer Post Debridement: Improved Severity of Tissue Post Debridement: Fat layer exposed Post Procedure Diagnosis Same as Pre-procedure Notes Scribed for Dr. Lady Gary by J.Scotton Electronic Signature(s) Signed: 09/10/2022 2:18:57 PM By: Duanne Guess MD FACS Signed: 09/10/2022 2:56:55 PM By: [name] Entered By: Karie Schwalbe on 09/10/2022 14:07:21 -------------------------------------------------------------------------------- HPI Details Patient Name: Date of Service: [name] [name]. 09/10/2022 1:15 PM Medical Record Number: [id number] Patient Account Number: [id number] Date of Birth/Sex: Treating RN: 1933-02-17 (89 y.o. F) Primary Care Provider: Cranford Mon Other Clinician: Referring Provider: Treating Provider/Extender: [name] in Treatment: 0 History of Present Illness HPI Description: ADMISSION 04/29/2022 This is an 89 year old type II diabetic (no hemoglobin A1c available for review) who also has hypertension, neuropathy, peripheral angiopathy, chronic kidney disease, among other medical comorbidities. Her history is somewhat difficult to put together as she is extremely hard of hearing and according to her son, who accompanies her today, she does not reliably see physicians. There are apparently some concerns for self-neglect, but the family dynamics are complicated. She has 2+ pitting edema bilaterally with bilateral erythema suggestive of stasis dermatitis. What little outside information was sent  ahead indicates that she was thought to potentially have cellulitis and is she is currently taking Keflex for this. She also has open wounds on her right lower extremity on both the anterior, posterior, and medial aspects. They vary in depth but all have slough and nonviable subcutaneous tissue present. She has a small pressure ulcer on her left gluteus, just adjacent to the natal cleft. 05/06/2022: The anterior lower leg wound is healed. The posterior and medial lower leg wounds have accumulated a substantial amount of  slough. Edema control bilaterally is markedly improved from last week. The pressure ulcer on her left buttock is smaller and quite clean with just a small amount of slough and eschar present. 05/14/2022: The gluteal ulcer is healed. The ulcers on her posterior and medial lower leg are cleaner but still with a fair amount of slough buildup. They are less tender than on prior visits. Edema control bilaterally is good. 05/24/2022: The ulcers on her right lower leg are smaller and cleaner again today. Significantly less slough accumulation. Edema control is good. 06/04/2022: All of her wounds are nearly healed. The remaining open areas are small and superficial with just a little slough and eschar present. Edema control is excellent. 06/15/2022: Her wounds are healed. READMISSION 09/10/2022: She returns today with a stage III pressure ulcer on the right heel. She is clearly been adhering to my recommendation that she keep her feet elevated, but she has been propping her foot up on an ottoman and as a result, has had tissue breakdown at the posterior calcaneus. She has been wearing a bunny boot type slipper in bed at night, but this has caused an abrasion on her medial right lower leg. There is slough on the heel; at the ankle wound is clean. Electronic Signature(s) Signed: 09/10/2022 2:22:07 PM By: Duanne Guess MD FACS Entered By: Duanne Guess on 09/10/2022  14:22:07 -------------------------------------------------------------------------------- Physical Exam Details Patient Name: Date of Service: [name] [name]. 09/10/2022 1:15 PM [name] [name] ([id number]) [phone].pdf Page 3 of 10 Medical Record Number: [id number] Patient Account Number: [id number] Date of Birth/Sex: Treating RN: 25-Jun-1932 (89 y.o. F) Primary Care Provider: Cranford Mon Other Clinician: Referring Provider: Treating Provider/Extender: Eloy End Weeks in Treatment: 0 Constitutional Hypertensive, asymptomatic. . . . No acute distress. Respiratory Normal work of breathing on room air. Notes 09/10/2022: On her right posterior calcaneus, there is an oval ulcer with a little bit of slough on the surface. There is good granulation tissue underneath. On her medial right lower leg, there is an abrasion consistent with friction from the slipper she has been wearing. Electronic Signature(s) Signed: 09/10/2022 2:23:13 PM By: Duanne Guess MD FACS Entered By: Duanne Guess on 09/10/2022 14:23:12 -------------------------------------------------------------------------------- Physician Orders Details Patient Name: Date of Service: [name] [name]. 09/10/2022 1:15 PM Medical Record Number: [id number] Patient Account Number: [id number] Date of Birth/Sex: Treating RN: [birth year] (89 y.o. [name] [name] Primary Care Provider: Cranford Mon Other Clinician: Referring Provider: Treating Provider/Extender: [name] in Treatment: 0 Verbal / Phone Orders: No Diagnosis Coding ICD-10 Coding Code Description [phone] Pressure ulcer of right heel, stage 3 L97.811 Non-pressure chronic ulcer of other part of right lower leg limited to breakdown of skin I87.2 Venous insufficiency (chronic) (peripheral) E11.621 Type 2 diabetes mellitus with foot ulcer Follow-up Appointments ppointment in 1 week. - Dr.  Lady Gary Room 3 Return A Anesthetic Wound #4 Right Calcaneus (In clinic) Topical Lidocaine 5% applied to wound bed Wound #5 Right,Medial Lower Leg (In clinic) Topical Lidocaine 5% applied to wound bed Bathing/ Shower/ Hygiene Other Bathing/Shower/Hygiene Orders/Instructions: - May sponge bathe or shower with cast protector on right leg Off-Loading Wound #4 Right Calcaneus Prevalon Boot - Will order a prevalon boot for right foot Other: - Place pillow behind calf, to prop leg up. Wound #5 Right,Medial Lower Leg Prevalon Boot - Will order a prevalon boot for right foot Other: - Place pillow behind calf, to prop leg up. Wound Treatment Wound #  4 - Calcaneus Wound Laterality: Right Cleanser: Soap and Water 1 x Per Week/30 Days Discharge Instructions: May shower and wash wound with dial antibacterial soap and water prior to dressing change. DENISIA, EDGEMAN ([id number]) [id number] Page 4 of 10 Cleanser: Vashe 5.8 (oz) 1 x Per Week/30 Days Discharge Instructions: Cleanse the wound with Vashe prior to applying a clean dressing using gauze sponges, not tissue or cotton balls. Cleanser: Wound Cleanser 1 x Per Week/30 Days Discharge Instructions: Cleanse the wound with wound cleanser prior to applying a clean dressing using gauze sponges, not tissue or cotton balls. Peri-Wound Care: Sween Lotion (Moisturizing lotion) 1 x Per Week/30 Days Discharge Instructions: Apply moisturizing lotion as directed Prim Dressing: Maxorb Extra Ag+ Alginate Dressing, 2x2 (in/in) 1 x Per Week/30 Days ary Discharge Instructions: Apply to wound bed as instructed Secondary Dressing: Woven Gauze Sponge, Non-Sterile 4x4 in 1 x Per Week/30 Days Discharge Instructions: Apply over primary dressing as directed. Secondary Dressing: Heel Cup 1 x Per Week/30 Days Discharge Instructions: right foot Secured With: Transpore Surgical Tape, 2x10 (in/yd) 1 x Per Week/30 Days Discharge Instructions: Secure  dressing with tape as directed. Compression Wrap: ThreePress (3 layer compression wrap) 1 x Per Week/30 Days Discharge Instructions: Apply three layer compression as directed. Wound #5 - Lower Leg Wound Laterality: Right, Medial Cleanser: Soap and Water 1 x Per Week/30 Days Discharge Instructions: May shower and wash wound with dial antibacterial soap and water prior to dressing change. Cleanser: Vashe 5.8 (oz) 1 x Per Week/30 Days Discharge Instructions: Cleanse the wound with Vashe prior to applying a clean dressing using gauze sponges, not tissue or cotton balls. Cleanser: Wound Cleanser 1 x Per Week/30 Days Discharge Instructions: Cleanse the wound with wound cleanser prior to applying a clean dressing using gauze sponges, not tissue or cotton balls. Peri-Wound Care: Sween Lotion (Moisturizing lotion) 1 x Per Week/30 Days Discharge Instructions: Apply moisturizing lotion as directed Prim Dressing: Maxorb Extra Ag+ Alginate Dressing, 2x2 (in/in) 1 x Per Week/30 Days ary Discharge Instructions: Apply to wound bed as instructed Secondary Dressing: Woven Gauze Sponge, Non-Sterile 4x4 in 1 x Per Week/30 Days Discharge Instructions: Apply over primary dressing as directed. Secured With: Transpore Surgical Tape, 2x10 (in/yd) 1 x Per Week/30 Days Discharge Instructions: Secure dressing with tape as directed. Compression Wrap: ThreePress (3 layer compression wrap) 1 x Per Week/30 Days Discharge Instructions: Apply three layer compression as directed. Compression Wrap: Netting #5 1 x Per Week/30 Days Electronic Signature(s) Signed: 09/14/2022 7:41:52 AM By: Duanne Guess MD FACS Previous Signature: 09/10/2022 2:18:57 PM Version By: Duanne Guess MD FACS Entered By: Duanne Guess on 09/10/2022 14:23:33 -------------------------------------------------------------------------------- Problem List Details Patient Name: Date of Service: [name] [name]. 09/10/2022 1:15 PM Medical Record  Number: [id number] Patient Account Number: [id number] Date of Birth/Sex: Treating RN: [birth year] (89 y.o. F) Primary Care Provider: Cranford Mon Other Clinician: Referring Provider: Treating Provider/Extender: [name] in Treatment: [address], Marleen [name] ([id number]) [id number] Page 5 of 10 Active Problems ICD-10 Encounter Code Description Active Date MDM Diagnosis L89.613 Pressure ulcer of right heel, stage 3 09/10/2022 No Yes L97.811 Non-pressure chronic ulcer of other part of right lower leg limited to breakdown 09/10/2022 No Yes of skin I87.2 Venous insufficiency (chronic) (peripheral) 09/10/2022 No Yes E11.621 Type 2 diabetes mellitus with foot ulcer 09/10/2022 No Yes Inactive Problems Resolved Problems Electronic Signature(s) Signed: 09/10/2022 2:20:21 PM By: Duanne Guess MD FACS Previous Signature: 09/10/2022 2:19:39 PM Version By: Duanne Guess  MD FACS Entered By: Duanne Guess on 09/10/2022 14:20:21 -------------------------------------------------------------------------------- Progress Note Details Patient Name: Date of Service: [name] [name], [name] TTIE [name]. 09/10/2022 1:15 PM Medical Record Number: [id number] Patient Account Number: [id number] Date of Birth/Sex: Treating RN: 03-Jan-1933 (89 y.o. F) Primary Care Provider: Cranford Mon Other Clinician: Referring Provider: Treating Provider/Extender: [name] in Treatment: 0 Subjective Chief Complaint Information obtained from Patient Patient presents for treatment of multiple open ulcers due to suspected venous insufficiency and a gluteal pressure ulcer 09/10/2022: pressure ulcer of right heel History of Present Illness (HPI) ADMISSION 04/29/2022 This is an 89 year old type II diabetic (no hemoglobin A1c available for review) who also has hypertension, neuropathy, peripheral angiopathy, chronic kidney disease, among other medical comorbidities.  Her history is somewhat difficult to put together as she is extremely hard of hearing and according to her son, who accompanies her today, she does not reliably see physicians. There are apparently some concerns for self-neglect, but the family dynamics are complicated. She has 2+ pitting edema bilaterally with bilateral erythema suggestive of stasis dermatitis. What little outside information was sent ahead indicates that she was thought to potentially have cellulitis and is she is currently taking Keflex for this. She also has open wounds on her right lower extremity on both the anterior, posterior, and medial aspects. They vary in depth but all have slough and nonviable subcutaneous tissue present. She has a small pressure ulcer on her left gluteus, just adjacent to the natal cleft. 05/06/2022: The anterior lower leg wound is healed. The posterior and medial lower leg wounds have accumulated a substantial amount of slough. Edema control bilaterally is markedly improved from last week. The pressure ulcer on her left buttock is smaller and quite clean with just a small amount of slough and eschar present. 05/14/2022: The gluteal ulcer is healed. The ulcers on her posterior and medial lower leg are cleaner but still with a fair amount of slough buildup. They are less tender than on prior visits. Edema control bilaterally is good. 05/24/2022: The ulcers on her right lower leg are smaller and cleaner again today. Significantly less slough accumulation. Edema control is good. 06/04/2022: All of her wounds are nearly healed. The remaining open areas are small and superficial with just a little slough and eschar present. Edema control is excellent. [name] [name], [name] [name] ([id number]) [id number] Page 6 of 10 06/15/2022: Her wounds are healed. READMISSION 09/10/2022: She returns today with a stage III pressure ulcer on the right heel. She is clearly been adhering to my recommendation that she  keep her feet elevated, but she has been propping her foot up on an ottoman and as a result, has had tissue breakdown at the posterior calcaneus. She has been wearing a bunny boot type slipper in bed at night, but this has caused an abrasion on her medial right lower leg. There is slough on the heel; at the ankle wound is clean. Patient History Information obtained from Patient. Allergies No Known Allergies Family History Unknown History. Social History Never smoker, Marital Status - Widowed, Alcohol Use - Never, Drug Use - No History, Caffeine Use - Never. Medical History Eyes Patient has history of Cataracts Cardiovascular Patient has history of Hypertension Endocrine Patient has history of Type II Diabetes Musculoskeletal Patient has history of Osteoarthritis Hospitalization/Surgery History - cataract extraction. - rectocele repair. - abdominal hysterectomy. - breast surgery. - c-eye surgery procedure. - eye surgery. Medical A Surgical History Notes nd Eyes diabetic retinopathy, macular degeneration Review  of Systems (ROS) Integumentary (Skin) Complains or has symptoms of Wounds - Right heel wound. Objective Constitutional Hypertensive, asymptomatic. No acute distress. Vitals Time Taken: 1:02 PM, Height: 65 in, Weight: 140 lbs, BMI: 23.3, Temperature: 97.9 F, Pulse: 72 bpm, Respiratory Rate: 18 breaths/min, Blood Pressure: 173/76 mmHg. Respiratory Normal work of breathing on room air. General Notes: 09/10/2022: On her right posterior calcaneus, there is an oval ulcer with a little bit of slough on the surface. There is good granulation tissue underneath. On her medial right lower leg, there is an abrasion consistent with friction from the slipper she has been wearing. Integumentary (Hair, Skin) Wound #4 status is Open. Original cause of wound was Pressure Injury. The date acquired was: 08/18/2022. The wound is located on the Right Calcaneus. The wound measures 1cm length x  1.8cm width x 0.1cm depth; 1.414cm^2 area and 0.141cm^3 volume. There is Fat Layer (Subcutaneous Tissue) exposed. There is no tunneling or undermining noted. There is a medium amount of serosanguineous drainage noted. The wound margin is distinct with the outline attached to the wound base. There is medium (34-66%) red granulation within the wound bed. There is a medium (34-66%) amount of necrotic tissue within the wound bed including Adherent Slough. The periwound skin appearance had no abnormalities noted for texture. The periwound skin appearance had no abnormalities noted for moisture. The periwound skin appearance had no abnormalities noted for color. Periwound temperature was noted as No Abnormality. Wound #5 status is Open. Original cause of wound was Footwear Injury. The date acquired was: 09/03/2022. The wound is located on the Right,Medial Lower Leg. The wound measures 0.4cm length x 0.3cm width x 0.1cm depth; 0.094cm^2 area and 0.009cm^3 volume. There is Fat Layer (Subcutaneous Tissue) exposed. There is no tunneling or undermining noted. There is large (67-100%) red granulation within the wound bed. There is a small (1-33%) amount of necrotic tissue within the wound bed including Adherent Slough. The periwound skin appearance had no abnormalities noted for texture. The periwound skin appearance had no abnormalities noted for moisture. The periwound skin appearance had no abnormalities noted for color. Periwound temperature was noted as No Abnormality. Assessment Active Problems ICD-10 Pressure ulcer of right heel, stage 3 Non-pressure chronic ulcer of other part of right lower leg limited to breakdown of skin Venous insufficiency (chronic) (peripheral) Type 2 diabetes mellitus with foot ulcer [name] [name], [name] [name] ([id number]) [id number] Page 7 of 10 Procedures Wound #4 Pre-procedure diagnosis of Wound #4 is a Diabetic Wound/Ulcer of the Lower Extremity located on  the Right Calcaneus .Severity of Tissue Pre Debridement is: Fat layer exposed. There was a Selective/Open Wound Non-Viable Tissue Debridement with a total area of 1.41 sq cm performed by Duanne Guess, MD. With the following instrument(s): Curette to remove Non-Viable tissue/material. Material removed includes [facility] after achieving pain control using Lidocaine 5% topical ointment. No specimens were taken. A time out was conducted at 14:03, prior to the start of the procedure. A Minimum amount of bleeding was controlled with Pressure. The procedure was tolerated well with a pain level of 0 throughout and a pain level of 0 following the procedure. Post Debridement Measurements: 1cm length x 1.8cm width x 0.1cm depth; 0.141cm^3 volume. Character of Wound/Ulcer Post Debridement is improved. Severity of Tissue Post Debridement is: Fat layer exposed. Post procedure Diagnosis Wound #4: Same as Pre-Procedure General Notes: Scribed for Dr. Lady Gary by J.Scotton. Pre-procedure diagnosis of Wound #4 is a Diabetic Wound/Ulcer of the Lower Extremity located on the Right Calcaneus .  There was a Three Layer Compression Therapy Procedure by Karie Schwalbe, RN. Post procedure Diagnosis Wound #4: Same as Pre-Procedure Wound #5 Pre-procedure diagnosis of Wound #5 is a Venous Leg Ulcer located on the Right,Medial Lower Leg . There was a Three Layer Compression Therapy Procedure by Karie Schwalbe, RN. Post procedure Diagnosis Wound #5: Same as Pre-Procedure Plan Follow-up Appointments: Return Appointment in 1 week. - Dr. Lady Gary Room 3 Anesthetic: Wound #4 Right Calcaneus: (In clinic) Topical Lidocaine 5% applied to wound bed Wound #5 Right,Medial Lower Leg: (In clinic) Topical Lidocaine 5% applied to wound bed Bathing/ Shower/ Hygiene: Other Bathing/Shower/Hygiene Orders/Instructions: - May sponge bathe or shower with cast protector on right leg Off-Loading: Wound #4 Right Calcaneus: Prevalon Boot - Will  order a prevalon boot for right foot Other: - Place pillow behind calf, to prop leg up. Wound #5 Right,Medial Lower Leg: Prevalon Boot - Will order a prevalon boot for right foot Other: - Place pillow behind calf, to prop leg up. WOUND #4: - Calcaneus Wound Laterality: Right Cleanser: Soap and Water 1 x Per Week/30 Days Discharge Instructions: May shower and wash wound with dial antibacterial soap and water prior to dressing change. Cleanser: Vashe 5.8 (oz) 1 x Per Week/30 Days Discharge Instructions: Cleanse the wound with Vashe prior to applying a clean dressing using gauze sponges, not tissue or cotton balls. Cleanser: Wound Cleanser 1 x Per Week/30 Days Discharge Instructions: Cleanse the wound with wound cleanser prior to applying a clean dressing using gauze sponges, not tissue or cotton balls. Peri-Wound Care: Sween Lotion (Moisturizing lotion) 1 x Per Week/30 Days Discharge Instructions: Apply moisturizing lotion as directed Prim Dressing: Maxorb Extra Ag+ Alginate Dressing, 2x2 (in/in) 1 x Per Week/30 Days ary Discharge Instructions: Apply to wound bed as instructed Secondary Dressing: Woven Gauze Sponge, Non-Sterile 4x4 in 1 x Per Week/30 Days Discharge Instructions: Apply over primary dressing as directed. Secondary Dressing: Heel Cup 1 x Per Week/30 Days Discharge Instructions: right foot Secured With: Transpore Surgical T ape, 2x10 (in/yd) 1 x Per Week/30 Days Discharge Instructions: Secure dressing with tape as directed. Com pression Wrap: ThreePress (3 layer compression wrap) 1 x Per Week/30 Days Discharge Instructions: Apply three layer compression as directed. WOUND #5: - Lower Leg Wound Laterality: Right, Medial Cleanser: Soap and Water 1 x Per Week/30 Days Discharge Instructions: May shower and wash wound with dial antibacterial soap and water prior to dressing change. Cleanser: Vashe 5.8 (oz) 1 x Per Week/30 Days Discharge Instructions: Cleanse the wound with Vashe  prior to applying a clean dressing using gauze sponges, not tissue or cotton balls. Cleanser: Wound Cleanser 1 x Per Week/30 Days Discharge Instructions: Cleanse the wound with wound cleanser prior to applying a clean dressing using gauze sponges, not tissue or cotton balls. Peri-Wound Care: Sween Lotion (Moisturizing lotion) 1 x Per Week/30 Days Discharge Instructions: Apply moisturizing lotion as directed Prim Dressing: Maxorb Extra Ag+ Alginate Dressing, 2x2 (in/in) 1 x Per Week/30 Days ary Discharge Instructions: Apply to wound bed as instructed Secondary Dressing: Woven Gauze Sponge, Non-Sterile 4x4 in 1 x Per Week/30 Days Discharge Instructions: Apply over primary dressing as directed. Secured With: Transpore Surgical T ape, 2x10 (in/yd) 1 x Per Week/30 Days Discharge Instructions: Secure dressing with tape as directed. Com pression Wrap: ThreePress (3 layer compression wrap) 1 x Per Week/30 Days Discharge Instructions: Apply three layer compression as directed. Com pression Wrap: Netting #5 1 x Per Week/30 Days [name] [name], [name] [name] ([id number]) [id number] Page 8 of 10  09/10/2022: She returns today with a pressure ulcer on her heel and an abrasion on her medial right lower leg secondary to a bunny boot type slipper. On her right posterior calcaneus, there is an oval ulcer with a little bit of slough on the surface. There is good granulation tissue underneath. On her medial right lower leg, there is an abrasion consistent with friction from the slipper she has been wearing. I used a curette to debride the slough from the heel wound. Will apply silver alginate to both sites and 3 layer compression equivalent. We will order her a proper Prevalon boot. She should continue to elevate her legs, but she needs to float the heel, rather than rested on her ottoman. She will follow-up in a week. Electronic Signature(s) Signed: 10/18/2022 2:35:39 PM By: Pearletha Alfred Signed:  10/18/2022 2:55:18 PM By: Duanne Guess MD FACS Previous Signature: 09/10/2022 2:24:38 PM Version By: Duanne Guess MD FACS Entered By: Pearletha Alfred on 10/18/2022 14:35:39 -------------------------------------------------------------------------------- HxROS Details Patient Name: Date of Service: [name] [name]. 09/10/2022 1:15 PM Medical Record Number: [id number] Patient Account Number: [id number] Date of Birth/Sex: Treating RN: [birth year] (89 y.o. [name] [name] Primary Care Provider: Cranford Mon Other Clinician: Referring Provider: Treating Provider/Extender: [name] in Treatment: 0 Information Obtained From Patient Integumentary (Skin) Complaints and Symptoms: Positive for: Wounds - Right heel wound Eyes Medical History: Positive for: Cataracts Past Medical History Notes: diabetic retinopathy, macular degeneration Cardiovascular Medical History: Positive for: Hypertension Endocrine Medical History: Positive for: Type II Diabetes Time with diabetes: 45 yrs Treated with: Insulin Blood sugar tested every day: Yes Tested : Musculoskeletal Medical History: Positive for: Osteoarthritis HBO Extended History Items Eyes: Cataracts Immunizations Pneumococcal Vaccine: Received Pneumococcal Vaccination: No [name] [name], [name] [name] ([id number]) [id number] Page 9 of 10 Implantable Devices None Hospitalization / Surgery History Type of Hospitalization/Surgery cataract extraction rectocele repair abdominal hysterectomy breast surgery c-eye surgery procedure eye surgery Family and Social History Unknown History: Yes; Never smoker; Marital Status - Widowed; Alcohol Use: Never; Drug Use: No History; Caffeine Use: Never; Financial Concerns: No; Food, Clothing or Shelter Needs: No; Support System Lacking: No; Transportation Concerns: No Electronic Signature(s) Signed: 09/10/2022 2:18:57 PM By: Duanne Guess MD  FACS Signed: 09/10/2022 2:56:55 PM By: [name] Entered By: Karie Schwalbe on 09/10/2022 13:31:49 -------------------------------------------------------------------------------- SuperBill Details Patient Name: Date of Service: [name] [name], [name] [name]. 09/10/2022 Medical Record Number: [id number] Patient Account Number: [id number] Date of Birth/Sex: Treating RN: October 08, 1932 (89 y.o. F) Primary Care Provider: Cranford Mon Other Clinician: Referring Provider: Treating Provider/Extender: [name] in Treatment: 0 Diagnosis Coding ICD-10 Codes Code Description [phone] Pressure ulcer of right heel, stage 3 L97.811 Non-pressure chronic ulcer of other part of right lower leg limited to breakdown of skin I87.2 Venous insufficiency (chronic) (peripheral) E11.621 Type 2 diabetes mellitus with foot ulcer Facility Procedures : CPT4 Code: 46962952 Description: 99213 - WOUND CARE VISIT-LEV 3 EST PT Modifier: 25 Quantity: 1 : CPT4 Code: [id number] Description: 97597 - DEBRIDE WOUND 1ST 20 SQ CM OR < ICD-10 Diagnosis Description L89.613 Pressure ulcer of right heel, stage 3 Modifier: Quantity: 1 : CPT4 Code: 10272536 Description: (Facility Use Only) [id number] - APPLY MULTLAY COMPRS LWR RT LEG ICD-10 Diagnosis Description L97.811 Non-pressure chronic ulcer of other part of right lower leg limited to breakdown of s Modifier: kin Quantity: 1 Physician Procedures : CPT4 Code Description Modifier [id number] 99214 - WC PHYS LEVEL 4 - EST PT 25  ICD-10 Diagnosis Description L89.613 Pressure ulcer of right heel, stage 3 L97.811 Non-pressure chronic ulcer of other part of right lower leg limited to breakdown of skin I87.2  Venous insufficiency (chronic) (peripheral) E11.621 Type 2 diabetes mellitus with foot ulcer Quantity: 1 : [id number] 97597 - WC PHYS DEBR WO ANESTH 20 SQ CM [name] [name], [name] [name] ([id number]) [id number] ICD-10 Diagnosis Description L89.613  Pressure ulcer of right heel, stage 3 Quantity: 1 Page 10 of 10 Electronic Signature(s) Signed: 09/10/2022 2:56:55 PM By: [name] Signed: 09/14/2022 7:41:52 AM By: Duanne Guess MD FACS Previous Signature: 09/10/2022 2:24:54 PM Version By: Duanne Guess MD FACS Entered By: Karie Schwalbe on 09/10/2022 14:55:06

## 2022-10-18 NOTE — Progress Notes (Signed)
MACKINLEY, ROORDA ([id number]) [id number] Page 1 of 9 Visit Report for 09/17/2022 Chief Complaint Document Details Patient Name: Date of Service: [place] [name], [name] 09/17/2022 10:45 A M Medical Record Number: [id number] Patient Account Number: [id number] Date of Birth/Sex: Treating RN: 1933/02/20 (89 y.o. F) Primary Care Provider: Cranford Mon Other Clinician: Referring Provider: Treating Provider/Extender: [name] in Treatment: 1 Information Obtained from: Patient Chief Complaint Patient presents for treatment of multiple open ulcers due to suspected venous insufficiency and a gluteal pressure ulcer 09/10/2022: pressure ulcer of right heel Electronic Signature(s) Signed: 09/17/2022 10:54:12 AM By: Duanne Guess MD FACS Entered By: Duanne Guess on 09/17/2022 10:54:12 -------------------------------------------------------------------------------- Debridement Details Patient Name: Date of Service: [name] [name], [name] 09/17/2022 10:45 A M Medical Record Number: [id number] Patient Account Number: [id number] Date of Birth/Sex: Treating RN: 1932-06-17 (89 y.o. Fredderick Phenix Primary Care Provider: Cranford Mon Other Clinician: Referring Provider: Treating Provider/Extender: [name] in Treatment: 1 Debridement Performed for Assessment: Wound #4 Right Calcaneus Performed By: Physician Duanne Guess, MD Debridement Type: Debridement Severity of Tissue Pre Debridement: Fat layer exposed Level of Consciousness (Pre-procedure): Awake and Alert Pre-procedure Verification/Time Out Yes - 10:50 Taken: Start Time: 10:50 Pain Control: Lidocaine 5% topical ointment Percent of Wound Bed Debrided: 100% T Area Debrided (cm): otal 1.18 Tissue and other material debrided: Non-Viable, Slough, Slough Level: Non-Viable Tissue Debridement Description: Selective/Open Wound Instrument: Curette Bleeding:  Minimum Hemostasis Achieved: Pressure Response to Treatment: Procedure was tolerated well Level of Consciousness (Post- Awake and Alert procedure): Post Debridement Measurements of Total Wound Length: (cm) 1 Width: (cm) 1.5 Depth: (cm) 0.1 Volume: (cm) 0.118 Character of Wound/Ulcer Post Debridement: Improved Severity of Tissue Post Debridement: Fat layer exposed BEATRICE, MIRE B ([id number]) [id number] Page 2 of 9 Post Procedure Diagnosis Same as Pre-procedure Notes scribed for Dr. Lady Gary by Samuella Bruin, RN Electronic Signature(s) Signed: 09/17/2022 11:19:13 AM By: Duanne Guess MD FACS Signed: 09/17/2022 3:53:36 PM By: Samuella Bruin Entered By: Samuella Bruin on 09/17/2022 10:51:26 -------------------------------------------------------------------------------- HPI Details Patient Name: Date of Service: Kimberly [name], [name] 09/17/2022 10:45 A M Medical Record Number: [id number] Patient Account Number: [id number] Date of Birth/Sex: Treating RN: [birth year] (89 y.o. F) Primary Care Provider: Cranford Mon Other Clinician: Referring Provider: Treating Provider/Extender: [name] in Treatment: 1 History of Present Illness HPI Description: ADMISSION 04/29/2022 This is an 89 year old type II diabetic (no hemoglobin A1c available for review) who also has hypertension, neuropathy, peripheral angiopathy, chronic kidney disease, among other medical comorbidities. Her history is somewhat difficult to put together as she is extremely hard of hearing and according to her son, who accompanies her today, she does not reliably see physicians. There are apparently some concerns for self-neglect, but the family dynamics are complicated. She has 2+ pitting edema bilaterally with bilateral erythema suggestive of stasis dermatitis. What little outside information was sent ahead indicates that she was thought to potentially have  cellulitis and is she is currently taking Keflex for this. She also has open wounds on her right lower extremity on both the anterior, posterior, and medial aspects. They vary in depth but all have slough and nonviable subcutaneous tissue present. She has a small pressure ulcer on her left gluteus, just adjacent to the natal cleft. 05/06/2022: The anterior lower leg wound is healed. The posterior and medial lower leg wounds have accumulated a substantial amount of slough. Edema control bilaterally is markedly improved  from last week. The pressure ulcer on her left buttock is smaller and quite clean with just a small amount of slough and eschar present. 05/14/2022: The gluteal ulcer is healed. The ulcers on her posterior and medial lower leg are cleaner but still with a fair amount of slough buildup. They are less tender than on prior visits. Edema control bilaterally is good. 05/24/2022: The ulcers on her right lower leg are smaller and cleaner again today. Significantly less slough accumulation. Edema control is good. 06/04/2022: All of her wounds are nearly healed. The remaining open areas are small and superficial with just a little slough and eschar present. Edema control is excellent. 06/15/2022: Her wounds are healed. READMISSION 09/10/2022: She returns today with a stage III pressure ulcer on the right heel. She is clearly been adhering to my recommendation that she keep her feet elevated, but she has been propping her foot up on an ottoman and as a result, has had tissue breakdown at the posterior calcaneus. She has been wearing a bunny boot type slipper in bed at night, but this has caused an abrasion on her medial right lower leg. There is slough on the heel; at the ankle wound is clean. 09/17/2022: The ankle wound has healed. The heel ulcer is measuring smaller and has just a small amount of slough on the surface. Edema control is good. Electronic Signature(s) Signed: 09/17/2022 10:54:48 AM By:  Duanne Guess MD FACS Entered By: Duanne Guess on 09/17/2022 10:54:48 -------------------------------------------------------------------------------- Physical Exam Details Patient Name: Date of Service: Kimberly [name], [name] 09/17/2022 10:45 A M Medical Record Number: [id number] Patient Account Number: [id number] Date of Birth/Sex: Treating RN: 10/13/1932 (89 y.o. Janyria Sensat, Alisse B ([id number]) [id number] Page 3 of 9 Primary Care Provider: Cranford Mon Other Clinician: Referring Provider: Treating Provider/Extender: Eloy End Weeks in Treatment: 1 Constitutional Hypertensive, asymptomatic. . . . no acute distress. Respiratory Normal work of breathing on room air. Notes 09/17/2022: The ankle wound has healed. The heel ulcer is measuring smaller and has just a small amount of slough on the surface. Edema control is good. Electronic Signature(s) Signed: 09/17/2022 10:55:17 AM By: Duanne Guess MD FACS Entered By: Duanne Guess on 09/17/2022 10:55:17 -------------------------------------------------------------------------------- Physician Orders Details Patient Name: Date of Service: Kimberly [name], [name] 09/17/2022 10:45 A M Medical Record Number: [id number] Patient Account Number: [id number] Date of Birth/Sex: Treating RN: July 01, 1932 (89 y.o. Fredderick Phenix Primary Care Provider: Cranford Mon Other Clinician: Referring Provider: Treating Provider/Extender: [name] in Treatment: 1 Verbal / Phone Orders: No Diagnosis Coding ICD-10 Coding Code Description [phone] Pressure ulcer of right heel, stage 3 I87.2 Venous insufficiency (chronic) (peripheral) E11.621 Type 2 diabetes mellitus with foot ulcer Follow-up Appointments ppointment in 1 week. - Dr. Lady Gary Room 3 Return A Anesthetic Wound #4 Right Calcaneus (In clinic) Topical Lidocaine 5% applied to wound bed Bathing/ Shower/  Hygiene Other Bathing/Shower/Hygiene Orders/Instructions: - May sponge bathe or shower with cast protector on right leg Off-Loading Wound #4 Right Calcaneus Prevalon Boot Other: - Place pillow behind calf, to prop leg up. Wound Treatment Wound #4 - Calcaneus Wound Laterality: Right Cleanser: Soap and Water 1 x Per Week/30 Days Discharge Instructions: May shower and wash wound with dial antibacterial soap and water prior to dressing change. Cleanser: Vashe 5.8 (oz) 1 x Per Week/30 Days Discharge Instructions: Cleanse the wound with Vashe prior to applying a clean dressing using gauze sponges, not tissue or cotton balls.  Cleanser: Wound Cleanser 1 x Per Week/30 Days Discharge Instructions: Cleanse the wound with wound cleanser prior to applying a clean dressing using gauze sponges, not tissue or cotton balls. Peri-Wound Care: Sween Lotion (Moisturizing lotion) 1 x Per Week/30 Days Discharge Instructions: Apply moisturizing lotion as directed AVISHA, BARTOSH ([id number]) [id number] Page 4 of 9 Prim Dressing: Maxorb Extra Ag+ Alginate Dressing, 2x2 (in/in) 1 x Per Week/30 Days ary Discharge Instructions: Apply to wound bed as instructed Secondary Dressing: Woven Gauze Sponge, Non-Sterile 4x4 in 1 x Per Week/30 Days Discharge Instructions: Apply over primary dressing as directed. Secondary Dressing: Heel Cup 1 x Per Week/30 Days Discharge Instructions: right foot Secured With: Transpore Surgical Tape, 2x10 (in/yd) 1 x Per Week/30 Days Discharge Instructions: Secure dressing with tape as directed. Compression Wrap: ThreePress (3 layer compression wrap) 1 x Per Week/30 Days Discharge Instructions: Apply three layer compression as directed. Patient Medications llergies: No Known Allergies A Notifications Medication Indication Start End 09/17/2022 lidocaine DOSE topical 5 % ointment - ointment topical Electronic Signature(s) Signed: 09/17/2022 11:19:13 AM By:  Duanne Guess MD FACS Entered By: Duanne Guess on 09/17/2022 10:55:30 -------------------------------------------------------------------------------- Problem List Details Patient Name: Date of Service: Kimberly [name], [name] 09/17/2022 10:45 A M Medical Record Number: [id number] Patient Account Number: [id number] Date of Birth/Sex: Treating RN: [birth year] (89 y.o. F) Primary Care Provider: Cranford Mon Other Clinician: Referring Provider: Treating Provider/Extender: [name] in Treatment: 1 Active Problems ICD-10 Encounter Code Description Active Date MDM Diagnosis L89.613 Pressure ulcer of right heel, stage 3 09/10/2022 No Yes I87.2 Venous insufficiency (chronic) (peripheral) 09/10/2022 No Yes E11.621 Type 2 diabetes mellitus with foot ulcer 09/10/2022 No Yes Inactive Problems Resolved Problems ICD-10 Code Description Active Date Resolved Date L97.811 Non-pressure chronic ulcer of other part of right lower leg limited to breakdown of skin 09/10/2022 09/10/2022 JADA, MAYABB ([id number]) [phone].pdf Page 5 of 9 Electronic Signature(s) Signed: 09/17/2022 10:53:56 AM By: Duanne Guess MD FACS Entered By: Duanne Guess on 09/17/2022 10:53:56 -------------------------------------------------------------------------------- Progress Note Details Patient Name: Date of Service: Kimberly [name], [name] 09/17/2022 10:45 A M Medical Record Number: [id number] Patient Account Number: [id number] Date of Birth/Sex: Treating RN: July 31, 1932 (89 y.o. F) Primary Care Provider: Cranford Mon Other Clinician: Referring Provider: Treating Provider/Extender: [name] in Treatment: 1 Subjective Chief Complaint Information obtained from Patient Patient presents for treatment of multiple open ulcers due to suspected venous insufficiency and a gluteal pressure ulcer 09/10/2022: pressure ulcer of right heel History of  Present Illness (HPI) ADMISSION 04/29/2022 This is an 89 year old type II diabetic (no hemoglobin A1c available for review) who also has hypertension, neuropathy, peripheral angiopathy, chronic kidney disease, among other medical comorbidities. Her history is somewhat difficult to put together as she is extremely hard of hearing and according to her son, who accompanies her today, she does not reliably see physicians. There are apparently some concerns for self-neglect, but the family dynamics are complicated. She has 2+ pitting edema bilaterally with bilateral erythema suggestive of stasis dermatitis. What little outside information was sent ahead indicates that she was thought to potentially have cellulitis and is she is currently taking Keflex for this. She also has open wounds on her right lower extremity on both the anterior, posterior, and medial aspects. They vary in depth but all have slough and nonviable subcutaneous tissue present. She has a small pressure ulcer on her left gluteus, just adjacent to the natal cleft. 05/06/2022: The anterior  lower leg wound is healed. The posterior and medial lower leg wounds have accumulated a substantial amount of slough. Edema control bilaterally is markedly improved from last week. The pressure ulcer on her left buttock is smaller and quite clean with just a small amount of slough and eschar present. 05/14/2022: The gluteal ulcer is healed. The ulcers on her posterior and medial lower leg are cleaner but still with a fair amount of slough buildup. They are less tender than on prior visits. Edema control bilaterally is good. 05/24/2022: The ulcers on her right lower leg are smaller and cleaner again today. Significantly less slough accumulation. Edema control is good. 06/04/2022: All of her wounds are nearly healed. The remaining open areas are small and superficial with just a little slough and eschar present. Edema control is excellent. 06/15/2022: Her  wounds are healed. READMISSION 09/10/2022: She returns today with a stage III pressure ulcer on the right heel. She is clearly been adhering to my recommendation that she keep her feet elevated, but she has been propping her foot up on an ottoman and as a result, has had tissue breakdown at the posterior calcaneus. She has been wearing a bunny boot type slipper in bed at night, but this has caused an abrasion on her medial right lower leg. There is slough on the heel; at the ankle wound is clean. 09/17/2022: The ankle wound has healed. The heel ulcer is measuring smaller and has just a small amount of slough on the surface. Edema control is good. Patient History Information obtained from Patient. Family History Unknown History. Social History Never smoker, Marital Status - Widowed, Alcohol Use - Never, Drug Use - No History, Caffeine Use - Never. Medical History Eyes Patient has history of Cataracts Cardiovascular Patient has history of Hypertension Endocrine Patient has history of Type II Diabetes Musculoskeletal Patient has history of Osteoarthritis Hospitalization/Surgery History - cataract extraction. - rectocele repair. - abdominal hysterectomy. - breast surgery. - c-eye surgery procedure. - eye surgery. Medical A Surgical History Notes nd Eyes DESIRIE, BOSSCHER ([id number]) [id number] Page 6 of 9 diabetic retinopathy, macular degeneration Objective Constitutional Hypertensive, asymptomatic. no acute distress. Vitals Time Taken: 10:25 AM, Height: 65 in, Weight: 140 lbs, BMI: 23.3, Temperature: 97.8 F, Pulse: 78 bpm, Respiratory Rate: 20 breaths/min, Blood Pressure: 172/82 mmHg. Respiratory Normal work of breathing on room air. General Notes: 09/17/2022: The ankle wound has healed. The heel ulcer is measuring smaller and has just a small amount of slough on the surface. Edema control is good. Integumentary (Hair, Skin) Wound #4 status is Open. Original  cause of wound was Pressure Injury. The date acquired was: 08/18/2022. The wound has been in treatment 1 weeks. The wound is located on the Right Calcaneus. The wound measures 1cm length x 1.5cm width x 0.1cm depth; 1.178cm^2 area and 0.118cm^3 volume. There is Fat Layer (Subcutaneous Tissue) exposed. There is no tunneling or undermining noted. There is a medium amount of serosanguineous drainage noted. The wound margin is distinct with the outline attached to the wound base. There is large (67-100%) red granulation within the wound bed. There is a small (1-33%) amount of necrotic tissue within the wound bed including Adherent Slough. The periwound skin appearance had no abnormalities noted for texture. The periwound skin appearance had no abnormalities noted for moisture. The periwound skin appearance had no abnormalities noted for color. Periwound temperature was noted as No Abnormality. Wound #5 status is Healed - Epithelialized. Original cause of wound was Footwear Injury. The date  acquired was: 09/03/2022. The wound has been in treatment 1 weeks. The wound is located on the Right,Medial Lower Leg. The wound measures 0cm length x 0cm width x 0cm depth; 0cm^2 area and 0cm^3 volume. There is no tunneling or undermining noted. There is a none present amount of drainage noted. The wound margin is flat and intact. There is no granulation within the wound bed. There is no necrotic tissue within the wound bed. The periwound skin appearance had no abnormalities noted for texture. The periwound skin appearance had no abnormalities noted for moisture. The periwound skin appearance had no abnormalities noted for color. Periwound temperature was noted as No Abnormality. Assessment Active Problems ICD-10 Pressure ulcer of right heel, stage 3 Venous insufficiency (chronic) (peripheral) Type 2 diabetes mellitus with foot ulcer Procedures Wound #4 Pre-procedure diagnosis of Wound #4 is a Diabetic Wound/Ulcer  of the Lower Extremity located on the Right Calcaneus .Severity of Tissue Pre Debridement is: Fat layer exposed. There was a Selective/Open Wound Non-Viable Tissue Debridement with a total area of 1.18 sq cm performed by Duanne Guess, MD. With the following instrument(s): Curette to remove Non-Viable tissue/material. Material removed includes [facility] after achieving pain control using Lidocaine 5% topical ointment. No specimens were taken. A time out was conducted at 10:50, prior to the start of the procedure. A Minimum amount of bleeding was controlled with Pressure. The procedure was tolerated well. Post Debridement Measurements: 1cm length x 1.5cm width x 0.1cm depth; 0.118cm^3 volume. Character of Wound/Ulcer Post Debridement is improved. Severity of Tissue Post Debridement is: Fat layer exposed. Post procedure Diagnosis Wound #4: Same as Pre-Procedure General Notes: scribed for Dr. Lady Gary by Samuella Bruin, RN. Pre-procedure diagnosis of Wound #4 is a Diabetic Wound/Ulcer of the Lower Extremity located on the Right Calcaneus . There was a Double Layer Compression Therapy Procedure by Burt Ek, RN. Post procedure Diagnosis Wound #4: Same as Pre-Procedure Plan Follow-up Appointments: Return Appointment in 1 week. - Dr. Lady Gary Room 3 Anesthetic: Wound #4 Right Calcaneus: JENASYS, JIMMERSON ([id number]) [id number] Page 7 of 9 (In clinic) Topical Lidocaine 5% applied to wound bed Bathing/ Shower/ Hygiene: Other Bathing/Shower/Hygiene Orders/Instructions: - May sponge bathe or shower with cast protector on right leg Off-Loading: Wound #4 Right Calcaneus: Prevalon Boot Other: - Place pillow behind calf, to prop leg up. The following medication(s) was prescribed: lidocaine topical 5 % ointment ointment topical was prescribed at facility WOUND #4: - Calcaneus Wound Laterality: Right Cleanser: Soap and Water 1 x Per Week/30 Days Discharge Instructions:  May shower and wash wound with dial antibacterial soap and water prior to dressing change. Cleanser: Vashe 5.8 (oz) 1 x Per Week/30 Days Discharge Instructions: Cleanse the wound with Vashe prior to applying a clean dressing using gauze sponges, not tissue or cotton balls. Cleanser: Wound Cleanser 1 x Per Week/30 Days Discharge Instructions: Cleanse the wound with wound cleanser prior to applying a clean dressing using gauze sponges, not tissue or cotton balls. Peri-Wound Care: Sween Lotion (Moisturizing lotion) 1 x Per Week/30 Days Discharge Instructions: Apply moisturizing lotion as directed Prim Dressing: Maxorb Extra Ag+ Alginate Dressing, 2x2 (in/in) 1 x Per Week/30 Days ary Discharge Instructions: Apply to wound bed as instructed Secondary Dressing: Woven Gauze Sponge, Non-Sterile 4x4 in 1 x Per Week/30 Days Discharge Instructions: Apply over primary dressing as directed. Secondary Dressing: Heel Cup 1 x Per Week/30 Days Discharge Instructions: right foot Secured With: Transpore Surgical T ape, 2x10 (in/yd) 1 x Per Week/30 Days Discharge Instructions: Secure  dressing with tape as directed. Com pression Wrap: ThreePress (3 layer compression wrap) 1 x Per Week/30 Days Discharge Instructions: Apply three layer compression as directed. 09/17/2022: The ankle wound has healed. The heel ulcer is measuring smaller and has just a small amount of slough on the surface. Edema control is good. I used a curette to debride slough from the ulcer on her heel. We will continue silver alginate and 3 layer compression. She was provided with a proper Prevalon boot today and her granddaughter instructed in how to apply it correctly. Follow-up in 1 week. Electronic Signature(s) Signed: 10/18/2022 2:36:01 PM By: Pearletha Alfred Signed: 10/18/2022 2:55:45 PM By: Duanne Guess MD FACS Previous Signature: 09/17/2022 10:56:10 AM Version By: Duanne Guess MD FACS Entered By: Pearletha Alfred on 10/18/2022  14:36:01 -------------------------------------------------------------------------------- HxROS Details Patient Name: Date of Service: Kimberly [name], [name] 09/17/2022 10:45 A M Medical Record Number: [id number] Patient Account Number: [id number] Date of Birth/Sex: Treating RN: 03-12-1933 (89 y.o. F) Primary Care Provider: Cranford Mon Other Clinician: Referring Provider: Treating Provider/Extender: [name] in Treatment: 1 Information Obtained From Patient Eyes Medical History: Positive for: Cataracts Past Medical History Notes: diabetic retinopathy, macular degeneration Cardiovascular Medical History: Positive for: Hypertension Endocrine Medical History: Positive for: Type II Diabetes Time with diabetes: 45 yrs BLANCH, BLOK ([id number]) [id number] Page 8 of 9 Treated with: Insulin Blood sugar tested every day: Yes Tested : Musculoskeletal Medical History: Positive for: Osteoarthritis HBO Extended History Items Eyes: Cataracts Immunizations Pneumococcal Vaccine: Received Pneumococcal Vaccination: No Implantable Devices None Hospitalization / Surgery History Type of Hospitalization/Surgery cataract extraction rectocele repair abdominal hysterectomy breast surgery c-eye surgery procedure eye surgery Family and Social History Unknown History: Yes; Never smoker; Marital Status - Widowed; Alcohol Use: Never; Drug Use: No History; Caffeine Use: Never; Financial Concerns: No; Food, Clothing or Shelter Needs: No; Support System Lacking: No; Transportation Concerns: No Electronic Signature(s) Signed: 09/17/2022 11:19:13 AM By: Duanne Guess MD FACS Entered By: Duanne Guess on 09/17/2022 10:54:54 -------------------------------------------------------------------------------- SuperBill Details Patient Name: Date of Service: [name], [name] 09/17/2022 Medical Record Number: [id number] Patient Account Number:  [id number] Date of Birth/Sex: Treating RN: 1933/04/06 (89 y.o. F) Primary Care Provider: Cranford Mon Other Clinician: Referring Provider: Treating Provider/Extender: Eloy End Weeks in Treatment: 1 Diagnosis Coding ICD-10 Codes Code Description [id number] Pressure ulcer of right heel, stage 3 I87.2 Venous insufficiency (chronic) (peripheral) E11.621 Type 2 diabetes mellitus with foot ulcer Facility Procedures : CPT4 Code: [id number] Description: 97597 - DEBRIDE WOUND 1ST 20 SQ CM OR < ICD-10 Diagnosis Description L89.613 Pressure ulcer of right heel, stage 3 Modifier: Quantity: 1 Physician Procedures : CPT4 Code Description Modifier ELYSSE, FRANZEL ([id number]) [id number] 99213 - WC PHYS LEVEL 3 - EST PT 25 1 ICD-10 Diagnosis Description L89.613 Pressure ulcer of right heel, stage 3 I87.2 Venous insufficiency  (chronic) (peripheral) E11.621 Type 2 diabetes mellitus with foot ulcer Quantity: df Page 9 of 9 : [id number] 97597 - WC PHYS DEBR WO ANESTH 20 SQ CM 1 ICD-10 Diagnosis Description L89.613 Pressure ulcer of right heel, stage 3 Quantity: Electronic Signature(s) Signed: 09/17/2022 10:56:42 AM By: Duanne Guess MD FACS Entered By: Duanne Guess on 09/17/2022 10:56:41

## 2022-10-19 NOTE — Progress Notes (Signed)
ACQUANETTA, PRASKA ([id number]) [id number] Page 1 of 7 Visit Report for 09/24/2022 HPI Details Patient Name: Date of Service: [name] [name], [name] 09/24/2022 3:15 PM Medical Record Number: [id number] Patient Account Number: [id number] Date of Birth/Sex: Treating RN: December 19, 1932 (89 y.o. F) Primary Care Provider: Cranford [name] Other Clinician: Referring Provider: Treating Provider/Extender: [name] in Treatment: 2 History of Present Illness HPI Description: ADMISSION 04/29/2022 This is an 89 year old type II diabetic (no hemoglobin A1c available for review) who also has hypertension, neuropathy, peripheral angiopathy, chronic kidney disease, among other medical comorbidities. Her history is somewhat difficult to put together as she is extremely hard of hearing and according to her son, who accompanies her today, she does not reliably see physicians. There are apparently some concerns for self-neglect, but the family dynamics are complicated. She has 2+ pitting edema bilaterally with bilateral erythema suggestive of stasis dermatitis. What little outside information was sent ahead indicates that she was thought to potentially have cellulitis and is she is currently taking Keflex for this. She also has open wounds on her right lower extremity on both the anterior, posterior, and medial aspects. They vary in depth but all have slough and nonviable subcutaneous tissue present. She has a small pressure ulcer on her left gluteus, just adjacent to the natal cleft. 05/06/2022: The anterior lower leg wound is healed. The posterior and medial lower leg wounds have accumulated a substantial amount of slough. Edema control bilaterally is markedly improved from last week. The pressure ulcer on her left buttock is smaller and quite clean with just a small amount of slough and eschar present. 05/14/2022: The gluteal ulcer is healed. The ulcers on her posterior and  medial lower leg are cleaner but still with a fair amount of slough buildup. They are less tender than on prior visits. Edema control bilaterally is good. 05/24/2022: The ulcers on her right lower leg are smaller and cleaner again today. Significantly less slough accumulation. Edema control is good. 06/04/2022: All of her wounds are nearly healed. The remaining open areas are small and superficial with just a little slough and eschar present. Edema control is excellent. 06/15/2022: Her wounds are healed. READMISSION 09/10/2022: She returns today with a stage III pressure ulcer on the right heel. She is clearly been adhering to my recommendation that she keep her feet elevated, but she has been propping her foot up on an ottoman and as a result, has had tissue breakdown at the posterior calcaneus. She has been wearing a bunny boot type slipper in bed at night, but this has caused an abrasion on her medial right lower leg. There is slough on the heel; at the ankle wound is clean. 09/17/2022: The ankle wound has healed. The heel ulcer is measuring smaller and has just a small amount of slough on the surface. Edema control is good. 6/7; patient has a pressure ulcer on the right lateral heel. She comes in today complaining of increasing pain with our intake nurse noting purulent drainage when she would change the dressing. The patient has a temperature of 99.1 otherwise her vitals are stable. Because of her hearing loss it is hard to really have a conversation although it is fairly reliable that she thinks the pain is more intense Electronic Signature(s) Signed: 09/24/2022 4:34:22 PM By: Baltazar Najjar MD Entered By: Baltazar Najjar on 09/24/2022 15:42:00 -------------------------------------------------------------------------------- Physical Exam Details Patient Name: Date of Service: Kimberly [name], Kimberly TTIE B. 09/24/2022 3:15 PM Medical Record Number:  [id number] Patient Account Number: [id number] Date of  Birth/Sex: Treating RN: 02-25-1933 (89 y.o. F) Primary Care Provider: Cranford [name] Other Clinician: Referring Provider: Treating Provider/Extender: [name] in Treatment: 2 Cardiovascular pedal pulses decreased but palpable. Kimberly, [name] ([id number]) [id number] Page 2 of 7 Notes Wound exam; lateral right heel. The wound does not look too bad. There is tenderness and erythema spreading distally towards the toes and posteriorly towards the base of the heel. This seems fairly reliable I have marked this area. There is no crepitus. No evidence of further wound breakdown that I can appreciate Electronic Signature(s) Signed: 09/24/2022 4:34:22 PM By: Baltazar Najjar MD Entered By: Baltazar Najjar on 09/24/2022 15:48:26 -------------------------------------------------------------------------------- Physician Orders Details Patient Name: Date of Service: Kimberly [name], Kimberly TTIE B. 09/24/2022 3:15 PM Medical Record Number: [id number] Patient Account Number: [id number] Date of Birth/Sex: Treating RN: 1933/02/01 (89 y.o. [name] Primary Care Provider: Cranford [name] Other Clinician: Referring Provider: Treating Provider/Extender: [name] in Treatment: 2 Verbal / Phone Orders: No Diagnosis Coding ICD-10 Coding Code Description [phone] Pressure ulcer of right heel, stage 3 I87.2 Venous insufficiency (chronic) (peripheral) E11.621 Type 2 diabetes mellitus with foot ulcer Follow-up Appointments ppointment in 1 week. - Dr. Lady Gary Room 3 Return A 10/05/22 at 11:45am Anesthetic Wound #4 Right Calcaneus (In clinic) Topical Lidocaine 5% applied to wound bed Bathing/ Shower/ Hygiene Other Bathing/Shower/Hygiene Orders/Instructions: - May sponge bathe or shower with cast protector on right leg Off-Loading Wound #4 Right Calcaneus Prevalon Boot Other: - Place pillow behind calf, to prop leg up. Wound  Treatment Wound #4 - Calcaneus Wound Laterality: Right Cleanser: Soap and Water 1 x Per Week/30 Days Discharge Instructions: May shower and wash wound with dial antibacterial soap and water prior to dressing change. Cleanser: Vashe 5.8 (oz) 1 x Per Week/30 Days Discharge Instructions: Cleanse the wound with Vashe prior to applying a clean dressing using gauze sponges, not tissue or cotton balls. Cleanser: Wound Cleanser 1 x Per Week/30 Days Discharge Instructions: Cleanse the wound with wound cleanser prior to applying a clean dressing using gauze sponges, not tissue or cotton balls. Peri-Wound Care: Sween Lotion (Moisturizing lotion) 1 x Per Week/30 Days Discharge Instructions: Apply moisturizing lotion as directed Prim Dressing: Maxorb Extra Ag+ Alginate Dressing, 2x2 (in/in) 1 x Per Week/30 Days ary Discharge Instructions: Apply to wound bed as instructed Secondary Dressing: Woven Gauze Sponge, Non-Sterile 4x4 in 1 x Per Week/30 Days Discharge Instructions: Apply over primary dressing as directed. Secondary Dressing: Heel Cup 1 x Per Week/30 Days Discharge Instructions: right foot MERIDA, SLATES ([id number]) [phone].pdf Page 3 of 7 Secured With: Transpore Surgical Tape, 2x10 (in/yd) 1 x Per Week/30 Days Discharge Instructions: Secure dressing with tape as directed. Compression Wrap: ThreePress (3 layer compression wrap) 1 x Per Week/30 Days Discharge Instructions: Apply three layer compression as directed. Laboratory naerobe culture (MICRO) - Evaluate for potential infection. - (ICD10 L89.613 - Pressure ulcer of Bacteria identified in Unspecified specimen by A right heel, stage 3) LOINC Code: 635-3 Convenience Name: Anaerobic culture Electronic Signature(s) Signed: 09/28/2022 5:26:07 PM By: [name] Signed: 10/19/2022 3:35:02 PM By: Baltazar Najjar MD Previous Signature: 09/24/2022 4:09:35 PM Version By: Brenton Grills Previous Signature: 09/24/2022  4:34:22 PM Version By: Baltazar Najjar MD Previous Signature: 09/24/2022 3:52:33 PM Version By: Baltazar Najjar MD Entered By: Karie Schwalbe on 09/28/2022 12:15:43 Prescription 09/24/2022 -------------------------------------------------------------------------------- Fayrene Fearing, Orlene B. Baltazar Najjar MD Patient Name: Provider: 1933-02-17 [id number] Date  of Birth: NPI#Carmon Ginsberg [id number] Sex: DEA #: [phone] [id number] Phone #: License #: [id number] UPN: Patient Address: [address] Eligha Bridegroom [facility] [place], [place] [address] [address] Floor [place], [place] [address] [phone] Allergies No Known Allergies Provider's Orders naerobe culture - ICD10: L89.613 - Evaluate for potential infection. Bacteria identified in Unspecified specimen by A LOINC Code: 635-3 Convenience Name: Anaerobic culture Hand Signature: Date(s): Electronic Signature(s) Signed: 09/28/2022 5:26:07 PM By: [name] Signed: 10/19/2022 3:35:02 PM By: Baltazar Najjar MD Previous Signature: 09/24/2022 4:09:35 PM Version By: Brenton Grills Previous Signature: 09/24/2022 4:34:22 PM Version By: Baltazar Najjar MD Entered By: Karie Schwalbe on 09/28/2022 12:15:43 Problem List Details -------------------------------------------------------------------------------- [name] ([id number]) [id number] Page 4 of 7 Patient Name: Date of Service: [name], [name] 09/24/2022 3:15 PM Medical Record Number: [id number] Patient Account Number: [id number] Date of Birth/Sex: Treating RN: 04-12-1933 (89 y.o. [name] Primary Care Provider: Cranford [name] Other Clinician: Referring Provider: Treating Provider/Extender: [name] in Treatment: 2 Active Problems ICD-10 Encounter Code Description Active Date MDM Diagnosis [phone] Pressure ulcer of right heel, stage 3 09/10/2022 No Yes I87.2 Venous insufficiency (chronic)  (peripheral) 09/10/2022 No Yes E11.621 Type 2 diabetes mellitus with foot ulcer 09/10/2022 No Yes Inactive Problems Resolved Problems ICD-10 Code Description Active Date Resolved Date L97.811 Non-pressure chronic ulcer of other part of right lower leg limited to breakdown of skin 09/10/2022 09/10/2022 Electronic Signature(s) Signed: 09/24/2022 4:34:22 PM By: Baltazar Najjar MD Entered By: Baltazar Najjar on 09/24/2022 15:40:51 -------------------------------------------------------------------------------- Progress Note Details Patient Name: Date of Service: Kimberly Bouillon, Kimberly TTIE B. 09/24/2022 3:15 PM Medical Record Number: [id number] Patient Account Number: [id number] Date of Birth/Sex: Treating RN: Aug 26, 1932 (89 y.o. F) Primary Care Provider: Cranford [name] Other Clinician: Referring Provider: Treating Provider/Extender: [name] in Treatment: 2 Subjective History of Present Illness (HPI) ADMISSION 04/29/2022 This is an 89 year old type II diabetic (no hemoglobin A1c available for review) who also has hypertension, neuropathy, peripheral angiopathy, chronic kidney disease, among other medical comorbidities. Her history is somewhat difficult to put together as she is extremely hard of hearing and according to her son, who accompanies her today, she does not reliably see physicians. There are apparently some concerns for self-neglect, but the family dynamics are complicated. She has 2+ pitting edema bilaterally with bilateral erythema suggestive of stasis dermatitis. What little outside information was sent ahead indicates that she was thought to potentially have cellulitis and is she is currently taking Keflex for this. She also has open wounds on her right lower extremity on both the anterior, posterior, and medial aspects. They vary in depth but all have slough and nonviable subcutaneous tissue present. She has a small pressure ulcer on her left gluteus, just adjacent to  the natal cleft. 05/06/2022: The anterior lower leg wound is healed. The posterior and medial lower leg wounds have accumulated a substantial amount of slough. Edema control bilaterally is markedly improved from last week. The pressure ulcer on her left buttock is smaller and quite clean with just a small amount of slough and eschar present. 05/14/2022: The gluteal ulcer is healed. The ulcers on her posterior and medial lower leg are cleaner but still with a fair amount of slough buildup. They are less tender than on prior visits. Edema control bilaterally is good. CAROLEENA, DEDERICK ([id number]) [id number] Page 5 of 7 05/24/2022: The ulcers on her right lower  leg are smaller and cleaner again today. Significantly less slough accumulation. Edema control is good. 06/04/2022: All of her wounds are nearly healed. The remaining open areas are small and superficial with just a little slough and eschar present. Edema control is excellent. 06/15/2022: Her wounds are healed. READMISSION 09/10/2022: She returns today with a stage III pressure ulcer on the right heel. She is clearly been adhering to my recommendation that she keep her feet elevated, but she has been propping her foot up on an ottoman and as a result, has had tissue breakdown at the posterior calcaneus. She has been wearing a bunny boot type slipper in bed at night, but this has caused an abrasion on her medial right lower leg. There is slough on the heel; at the ankle wound is clean. 09/17/2022: The ankle wound has healed. The heel ulcer is measuring smaller and has just a small amount of slough on the surface. Edema control is good. 6/7; patient has a pressure ulcer on the right lateral heel. She comes in today complaining of increasing pain with our intake nurse noting purulent drainage when she would change the dressing. The patient has a temperature of 99.1 otherwise her vitals are stable. Because of her hearing loss it  is hard to really have a conversation although it is fairly reliable that she thinks the pain is more intense Objective Constitutional Vitals Time Taken: 2:58 PM, Height: 65 in, Weight: 140 lbs, BMI: 23.3, Temperature: 99.1 F, Pulse: 69 bpm, Respiratory Rate: 18 breaths/min, Blood Pressure: 168/69 mmHg. Cardiovascular pedal pulses decreased but palpable. General Notes: Wound exam; lateral right heel. The wound does not look too bad. There is tenderness and erythema spreading distally towards the toes and posteriorly towards the base of the heel. This seems fairly reliable I have marked this area. There is no crepitus. No evidence of further wound breakdown that I can appreciate Integumentary (Hair, Skin) Wound #4 status is Open. Original cause of wound was Pressure Injury. The date acquired was: 08/18/2022. The wound has been in treatment 2 weeks. The wound is located on the Right Calcaneus. The wound measures 1cm length x 1.5cm width x 0.1cm depth; 1.178cm^2 area and 0.118cm^3 volume. There is Fat Layer (Subcutaneous Tissue) exposed. There is a medium amount of serosanguineous drainage noted. The wound margin is distinct with the outline attached to the wound base. There is large (67-100%) red granulation within the wound bed. There is a small (1-33%) amount of necrotic tissue within the wound bed including Adherent Slough. The periwound skin appearance had no abnormalities noted for texture. The periwound skin appearance had no abnormalities noted for moisture. The periwound skin appearance had no abnormalities noted for color. Periwound temperature was noted as No Abnormality. Assessment Active Problems ICD-10 Pressure ulcer of right heel, stage 3 Venous insufficiency (chronic) (peripheral) Type 2 diabetes mellitus with foot ulcer Procedures Wound #4 Pre-procedure diagnosis of Wound #4 is a Diabetic Wound/Ulcer of the Lower Extremity located on the Right Calcaneus . There was a Three  Layer Compression Therapy Procedure by Zenaida Deed, RN. Post procedure Diagnosis Wound #4: Same as Pre-Procedure Plan Follow-up Appointments: Return Appointment in 1 week. - Dr. Lady Gary Room 3 10/05/22 at 11:45am Anesthetic: Wound #4 Right Calcaneus: (In clinic) Topical Lidocaine 5% applied to wound bed Bathing/ Shower/ Hygiene: Other Bathing/Shower/Hygiene Orders/Instructions: - May sponge bathe or shower with cast protector on right leg Off-Loading: MICAYLA, ROMM ([id number]) [id number] Page 6 of 7 Wound #4 Right Calcaneus: Prevalon Boot Other: -  Place pillow behind calf, to prop leg up. Laboratory ordered were: Anaerobic culture - Evaluate for potential infection. WOUND #4: - Calcaneus Wound Laterality: Right Cleanser: Soap and Water 1 x Per Week/30 Days Discharge Instructions: May shower and wash wound with dial antibacterial soap and water prior to dressing change. Cleanser: Vashe 5.8 (oz) 1 x Per Week/30 Days Discharge Instructions: Cleanse the wound with Vashe prior to applying a clean dressing using gauze sponges, not tissue or cotton balls. Cleanser: Wound Cleanser 1 x Per Week/30 Days Discharge Instructions: Cleanse the wound with wound cleanser prior to applying a clean dressing using gauze sponges, not tissue or cotton balls. Peri-Wound Care: Sween Lotion (Moisturizing lotion) 1 x Per Week/30 Days Discharge Instructions: Apply moisturizing lotion as directed Prim Dressing: Maxorb Extra Ag+ Alginate Dressing, 2x2 (in/in) 1 x Per Week/30 Days ary Discharge Instructions: Apply to wound bed as instructed Secondary Dressing: Woven Gauze Sponge, Non-Sterile 4x4 in 1 x Per Week/30 Days Discharge Instructions: Apply over primary dressing as directed. Secondary Dressing: Heel Cup 1 x Per Week/30 Days Discharge Instructions: right foot Secured With: Transpore Surgical T ape, 2x10 (in/yd) 1 x Per Week/30 Days Discharge Instructions: Secure dressing  with tape as directed. Com pression Wrap: ThreePress (3 layer compression wrap) 1 x Per Week/30 Days Discharge Instructions: Apply three layer compression as directed. 1. Empiric doxycycline 100 twice daily for 7 days 2. Culture of the purulence obtained by our intake nurse sent for CandS 3. I have not changed the primary dressing which is silver alginate under compression. 4. Will bring her back for a nurse visit on Tuesday to check the status of the area of cellulitis. I have marked the area of erythema hopefully this might guide future management. 5. I have counseled that if she develops increasing fever, increasing pain before we see her she may need to seek urgent care type medical consultation Electronic Signature(s) Signed: 09/30/2022 4:44:08 PM By: Shawn Stall RN, BSN Signed: 10/19/2022 3:35:02 PM By: Baltazar Najjar MD Previous Signature: 09/24/2022 4:34:22 PM Version By: Baltazar Najjar MD Entered By: Shawn Stall on 09/30/2022 16:44:08 -------------------------------------------------------------------------------- SuperBill Details Patient Name: Date of Service: Kimberly [name], Kimberly TTIE B. 09/24/2022 Medical Record Number: [id number] Patient Account Number: [id number] Date of Birth/Sex: Treating RN: 08-18-1932 (89 y.o. [name] Primary Care Provider: Cranford [name] Other Clinician: Referring Provider: Treating Provider/Extender: [name] in Treatment: 2 Diagnosis Coding ICD-10 Codes Code Description [phone] Pressure ulcer of right heel, stage 3 I87.2 Venous insufficiency (chronic) (peripheral) E11.621 Type 2 diabetes mellitus with foot ulcer L03.115 Cellulitis of right lower limb Facility Procedures : CPT4 Code: [id number] Description: (Facility Use Only) [phone] - APPLY MULTLAY COMPRS LWR RT LEG ICD-10 Diagnosis Description L89.613 Pressure ulcer of right heel, stage 3 Modifier: Quantity: 1 Physician Procedures : CPT4 Code Description Modifier  [id number] - WC PHYS LEVEL 4 - EST PT ZARA, HENRETTY ([id number]) [id number] Page 7 ICD-10 Diagnosis Description L89.613 Pressure ulcer of right heel, stage 3 L03.115 Cellulitis of right  lower limb Quantity: 1 of 7 Electronic Signature(s) Signed: 09/24/2022 4:34:22 PM By: Baltazar Najjar MD Entered By: Baltazar Najjar on 09/24/2022 15:54:32

## 2022-10-22 ENCOUNTER — Encounter (HOSPITAL_BASED_OUTPATIENT_CLINIC_OR_DEPARTMENT_OTHER): Payer: Medicare Other | Attending: General Surgery | Admitting: General Surgery

## 2022-10-22 DIAGNOSIS — E1151 Type 2 diabetes mellitus with diabetic peripheral angiopathy without gangrene: Secondary | ICD-10-CM | POA: Diagnosis not present

## 2022-10-22 DIAGNOSIS — M1991 Primary osteoarthritis, unspecified site: Secondary | ICD-10-CM | POA: Diagnosis not present

## 2022-10-22 DIAGNOSIS — L89613 Pressure ulcer of right heel, stage 3: Secondary | ICD-10-CM | POA: Insufficient documentation

## 2022-10-22 DIAGNOSIS — E11621 Type 2 diabetes mellitus with foot ulcer: Secondary | ICD-10-CM | POA: Insufficient documentation

## 2022-10-22 DIAGNOSIS — I10 Essential (primary) hypertension: Secondary | ICD-10-CM | POA: Diagnosis not present

## 2022-10-22 DIAGNOSIS — I872 Venous insufficiency (chronic) (peripheral): Secondary | ICD-10-CM | POA: Diagnosis not present

## 2022-10-22 DIAGNOSIS — E119 Type 2 diabetes mellitus without complications: Secondary | ICD-10-CM | POA: Diagnosis not present

## 2022-10-22 DIAGNOSIS — H259 Unspecified age-related cataract: Secondary | ICD-10-CM | POA: Diagnosis not present

## 2022-10-22 NOTE — Progress Notes (Signed)
Kimberly, [name] ([id number]) [id number] Page 1 of 7 Visit Report for 10/22/2022 Arrival Information Details Patient Name: Date of Service: [name] [name] 10/22/2022 10:45 A M Medical Record Number: [id number] Patient Account Number: [id number] Date of Birth/Sex: Treating RN: 1932/08/21 (89 y.o. F) Primary Care [name] Other Clinician: Referring [name]: Treating [name]/Extender: [name] in Treatment: 6 Visit Information History Since Last Visit All ordered tests and consults were completed: No Patient Arrived: Ambulatory Added or deleted any medications: No Arrival Time: 10:24 Any new allergies or adverse reactions: No Accompanied By: daughter Had a fall or experienced change in No Transfer Assistance: None activities of daily living that may affect Patient Identification Verified: Yes risk of falls: Secondary Verification Process Completed: Yes Signs or symptoms of abuse/neglect since last visito No Patient Requires Transmission-Based Precautions: No Hospitalized since last visit: No Patient Has Alerts: Yes Implantable device outside of the clinic excluding No Patient Alerts: ABI R 0.87 cellular tissue based products placed in the center since last visit: Pain Present Now: No Electronic Signature(s) Signed: 10/22/2022 11:23:56 AM By: [name] Entered By: [name] on 10/22/2022 10:25:09 -------------------------------------------------------------------------------- Clinic Level of Care Assessment Details Patient Name: Date of Service: [name] [name]. 10/22/2022 10:45 A M Medical Record Number: [id number] Patient Account Number: [id number] Date of Birth/Sex: Treating RN: 11/29/1932 (89 y.o. Fredderick [name] Primary Care [name]: [name] Other Clinician: Referring Cathryn Gallery: Treating Demar Shad/Extender: [name] in Treatment: 6 Clinic Level of Care Assessment  Items TOOL 4 Quantity Score X- 1 0 Use when only an EandM is performed on FOLLOW-UP visit ASSESSMENTS - Nursing Assessment / Reassessment []  - 0 Reassessment of Co-morbidities (includes updates in patient status) X- 1 5 Reassessment of Adherence to Treatment Plan ASSESSMENTS - Wound and Skin A ssessment / Reassessment X - Simple Wound Assessment / Reassessment - one wound 1 5 []  - 0 Complex Wound Assessment / Reassessment - multiple wounds []  - 0 Dermatologic / Skin Assessment (not related to wound area) ASSESSMENTS - Focused Assessment []  - 0 Circumferential Edema Measurements - multi extremities []  - 0 Nutritional Assessment / Counseling / Intervention CIERRAH, MORIEL ([id number]) [id number] Page 2 of 7 []  - 0 Lower Extremity Assessment (monofilament, tuning fork, pulses) []  - 0 Peripheral Arterial Disease Assessment (using hand held doppler) ASSESSMENTS - Ostomy and/or Continence Assessment and Care []  - 0 Incontinence Assessment and Management []  - 0 Ostomy Care Assessment and Management (repouching, etc.) PROCESS - Coordination of Care X - Simple Patient / Family Education for ongoing care 1 15 []  - 0 Complex (extensive) Patient / Family Education for ongoing care X- 1 10 Staff obtains Chiropractor, Records, T Results / Process Orders est []  - 0 Staff telephones HHA, Nursing Homes / Clarify orders / etc []  - 0 Routine Transfer to another Facility (non-emergent condition) []  - 0 Routine Hospital Admission (non-emergent condition) []  - 0 New Admissions / Manufacturing engineer / Ordering NPWT Apligraf, etc. , []  - 0 Emergency Hospital Admission (emergent condition) X- 1 10 Simple Discharge Coordination []  - 0 Complex (extensive) Discharge Coordination PROCESS - Special Needs []  - 0 Pediatric / Minor Patient Management []  - 0 Isolation Patient Management []  - 0 Hearing / Language / Visual special needs []  - 0 Assessment of  Community assistance (transportation, D/C planning, etc.) []  - 0 Additional assistance / Altered mentation []  - 0 Support Surface(s) Assessment (bed, cushion, seat, etc.) INTERVENTIONS -  Wound Cleansing / Measurement X - Simple Wound Cleansing - one wound 1 5 []  - 0 Complex Wound Cleansing - multiple wounds X- 1 5 Wound Imaging (photographs - any number of wounds) []  - 0 Wound Tracing (instead of photographs) []  - 0 Simple Wound Measurement - one wound []  - 0 Complex Wound Measurement - multiple wounds INTERVENTIONS - Wound Dressings X - Small Wound Dressing one or multiple wounds 1 10 []  - 0 Medium Wound Dressing one or multiple wounds []  - 0 Large Wound Dressing one or multiple wounds X- 1 5 Application of Medications - topical []  - 0 Application of Medications - injection INTERVENTIONS - Miscellaneous []  - 0 External ear exam []  - 0 Specimen Collection (cultures, biopsies, blood, body fluids, etc.) []  - 0 Specimen(s) / Culture(s) sent or taken to Lab for analysis []  - 0 Patient Transfer (multiple staff / Nurse, adult / Similar devices) []  - 0 Simple Staple / Suture removal (25 or less) []  - 0 Complex Staple / Suture removal (26 or more) []  - 0 Hypo / Hyperglycemic Management (close monitor of Blood Glucose) [name], [name] ([id number]) [id number] Page 3 of 7 []  - 0 Ankle / Brachial Index (ABI) - do not check if billed separately X- 1 5 Vital Signs Has the patient been seen at the hospital within the last three years: Yes Total Score: 75 Level Of Care: New/Established - Level 2 Electronic Signature(s) Signed: 10/22/2022 3:40:23 PM By: [name] Entered By: [name] on 10/22/2022 10:43:33 -------------------------------------------------------------------------------- Encounter Discharge Information Details Patient Name: Date of Service: [name] [name]. 10/22/2022 10:45 A M Medical Record Number: [id number] Patient  Account Number: [id number] Date of Birth/Sex: Treating RN: 1933/01/25 (89 y.o. Fredderick [name] Primary Care [name] Other Clinician: Referring [name]: Treating [name] Quam/Extender: [name] in Treatment: 6 Encounter Discharge Information Items Discharge Condition: Stable Ambulatory Status: Ambulatory Discharge Destination: Home Transportation: Private Auto Accompanied By: granddaughter Schedule Follow-up Appointment: No Clinical Summary of Care: Patient Declined Electronic Signature(s) Signed: 10/22/2022 3:40:23 PM By: Gelene Mink By: [name] on 10/22/2022 10:42:45 -------------------------------------------------------------------------------- Lower Extremity Assessment Details Patient Name: Date of Service: [name], [name] [name]. 10/22/2022 10:45 A M Medical Record Number: [id number] Patient Account Number: [id number] Date of Birth/Sex: Treating RN: 07-18-1932 (89 y.o. Fredderick [name] Primary Care Jessieca Rhem: [name] Other Clinician: Referring Hoover Grewe: Treating Davide Risdon/Extender: Eloy End Weeks in Treatment: 6 Edema Assessment Assessed: [Left: No] [Right: No] [Left: Edema] [Right: :] Calf Left: Right: Point of Measurement: 33 cm From Medial Instep 29.4 cm Ankle Left: Right: Point of Measurement: 11 cm From Medial Instep 18.5 cm Electronic Signature(s) Signed: 10/22/2022 3:40:23 PM By: Mellody Memos, Adahlia [name] ([id number]) PM By: Loyal Buba.pdf Page 4 of 7 Signed: 10/22/2022 3:40:23 aylor Entered By: [name] on 10/22/2022 10:34:37 -------------------------------------------------------------------------------- Multi Wound Chart Details Patient Name: Date of Service: [facility], [name] [name] 10/22/2022 10:45 A M Medical Record Number: [id number] Patient Account Number: [id number] Date of Birth/Sex: Treating RN: Dec 31, 1932 (89  y.o. F) Primary Care Shyana Kulakowski: [name] Other Clinician: Referring Deklyn Gibbon: Treating Kailly Richoux/Extender: [name] in Treatment: 6 Vital Signs Height(in): 65 Pulse(bpm): 71 Weight(lbs): 140 Blood Pressure(mmHg): 145/79 Body Mass Index(BMI): 23.3 Temperature(F): 97.9 Respiratory Rate(breaths/min): 18 [4:Photos:] [N/A:N/A] Right Calcaneus N/A N/A Wound Location: Pressure Injury N/A N/A Wounding Event: Diabetic Wound/Ulcer of the Lower N/A N/A Primary Etiology: Extremity Cataracts, Hypertension, Type II  N/A N/A Comorbid History: Diabetes, Osteoarthritis 08/18/2022 N/A N/A Date Acquired: 6 N/A N/A Weeks of Treatment: Healed - Epithelialized N/A N/A Wound Status: No N/A N/A Wound Recurrence: 0x0x0 N/A N/A Measurements L x W x D (cm) 0 N/A N/A A (cm) : rea 0 N/A N/A Volume (cm) : 100.00% N/A N/A % Reduction in A rea: 100.00% N/A N/A % Reduction in Volume: Grade 1 N/A N/A Classification: None Present N/A N/A Exudate A mount: Distinct, outline attached N/A N/A Wound Margin: None Present (0%) N/A N/A Granulation A mount: None Present (0%) N/A N/A Necrotic A mount: Fascia: No N/A N/A Exposed Structures: Fat Layer (Subcutaneous Tissue): No Tendon: No Muscle: No Joint: No Bone: No Large (67-100%) N/A N/A Epithelialization: No Abnormalities Noted N/A N/A Periwound Skin Texture: No Abnormalities Noted N/A N/A Periwound Skin Moisture: No Abnormalities Noted N/A N/A Periwound Skin Color: No Abnormality N/A N/A Temperature: Treatment Notes Electronic Signature(s) Signed: 10/22/2022 10:56:16 AM By: Duanne Guess MD FACS Entered By: Duanne Guess on 10/22/2022 10:56:16 [name], [name] ([id number]) [id number] Page 5 of 7 -------------------------------------------------------------------------------- Multi-Disciplinary Care Plan Details Patient Name: Date of Service: [facility], [name] [name] 10/22/2022 10:45  A M Medical Record Number: [id number] Patient Account Number: [id number] Date of Birth/Sex: Treating RN: 1933-02-10 (89 y.o. Fredderick [name] Primary Care Rosselyn Martha: [name] Other Clinician: Referring Jonetta Dagley: Treating Resa Rinks/Extender: Eloy End Weeks in Treatment: 6 Active Inactive Electronic Signature(s) Signed: 10/22/2022 3:40:23 PM By: Gelene Mink By: [name] on 10/22/2022 10:42:52 -------------------------------------------------------------------------------- Pain Assessment Details Patient Name: Date of Service: [name] [name]. 10/22/2022 10:45 A M Medical Record Number: [id number] Patient Account Number: [id number] Date of Birth/Sex: Treating RN: [birth year] (89 y.o. F) Primary Care Robertson Colclough: [name] Other Clinician: Referring Taijuan Serviss: Treating Reagan Klemz/Extender: [name] in Treatment: 6 Active Problems Location of Pain Severity and Description of Pain Patient Has Paino No Site Locations Pain Management and Medication Current Pain Management: Electronic Signature(s) Signed: 10/22/2022 11:23:56 AM By: [name] Entered By: [name] on 10/22/2022 10:25:35 Aller, Eleonore [name] ([id number]) [id number] Page 6 of 7 -------------------------------------------------------------------------------- Patient/Caregiver Education Details Patient Name: Date of Service: [facility] [name], [name] [name] 7/5/2024andnbsp10:45 A M Medical Record Number: [id number] Patient Account Number: [id number] Date of Birth/Gender: Treating RN: 06/12/1932 (89 y.o. Fredderick [name] Primary Care Physician: [name] Other Clinician: Referring Physician: Treating Physician/Extender: [name] in Treatment: 6 Education Assessment Education Provided To: Patient Education Topics Provided Safety: Methods: Explain/Verbal Responses: Reinforcements needed, State content  correctly Electronic Signature(s) Signed: 10/22/2022 3:40:23 PM By: [name] Entered By: [name] on 10/22/2022 10:43:10 -------------------------------------------------------------------------------- Wound Assessment Details Patient Name: Date of Service: [name], [name] [name]. 10/22/2022 10:45 A M Medical Record Number: [id number] Patient Account Number: [id number] Date of Birth/Sex: Treating RN: Sep 20, 1932 (89 y.o. F) Primary Care Margretta Zamorano: [name] Other Clinician: Referring Sherby Moncayo: Treating Kasha Howeth/Extender: [name] in Treatment: 6 Wound Status Wound Number: 4 Primary Etiology: Diabetic Wound/Ulcer of the Lower Extremity Wound Location: Right Calcaneus Wound Status: Healed - Epithelialized Wounding Event: Pressure Injury Comorbid History: Cataracts, Hypertension, Type II Diabetes, Osteoarthritis Date Acquired: 08/18/2022 Weeks Of Treatment: 6 Clustered Wound: No Photos Wound Measurements Length: (cm) 0 Width: (cm) 0 Shippey, [name] ([id number]) Depth: (cm) 0 Area: (cm) 0 Volume: (cm) 0 % Reduction in Area: 100% % Reduction in Volume: 100% [id number] Page 7 of 7 Epithelialization: Large (67-100%) Tunneling: No  Undermining: No Wound Description Classification: Grade 1 Wound Margin: Distinct, outline attached Exudate Amount: None Present Foul Odor After Cleansing: No Slough/Fibrino No Wound Bed Granulation Amount: None Present (0%) Exposed Structure Necrotic Amount: None Present (0%) Fascia Exposed: No Fat Layer (Subcutaneous Tissue) Exposed: No Tendon Exposed: No Muscle Exposed: No Joint Exposed: No Bone Exposed: No Periwound Skin Texture Texture Color No Abnormalities Noted: Yes No Abnormalities Noted: Yes Moisture Temperature / Pain No Abnormalities Noted: Yes Temperature: No Abnormality Electronic Signature(s) Signed: 10/22/2022 3:40:23 PM By: [name] Entered By:  [name] on 10/22/2022 10:37:58 -------------------------------------------------------------------------------- Vitals Details Patient Name: Date of Service: [name], [name] [name]. 10/22/2022 10:45 A M Medical Record Number: [id number] Patient Account Number: [id number] Date of Birth/Sex: Treating RN: Nov 10, 1932 (89 y.o. F) Primary Care Mailey Landstrom: [name] Other Clinician: Referring Verbie Babic: Treating Julena Barbour/Extender: [name] in Treatment: 6 Vital Signs Time Taken: 10:25 Temperature (F): 97.9 Height (in): 65 Pulse (bpm): 71 Weight (lbs): 140 Respiratory Rate (breaths/min): 18 Body Mass Index (BMI): 23.3 Blood Pressure (mmHg): 145/79 Reference Range: 80 - 120 mg / dl Electronic Signature(s) Signed: 10/22/2022 11:23:56 AM By: [name] Entered By: [name] on 10/22/2022 10:25:29

## 2022-10-22 NOTE — Progress Notes (Signed)
Kimberly, [name] ([id number]) [id number] Page 1 of 7 Visit Report for 10/22/2022 Chief Complaint Document Details Patient Name: Date of Service: [name], [name] 10/22/2022 10:45 A M Medical Record Number: [id number] Patient Account Number: [id number] Date of Birth/Sex: Treating RN: 01/20/1933 (89 y.o. F) Primary Care Provider: Cranford Mon Other Clinician: Referring Provider: Treating Provider/Extender: [name] in Treatment: 6 Information Obtained from: Patient Chief Complaint Patient presents for treatment of multiple open ulcers due to suspected venous insufficiency and a gluteal pressure ulcer 09/10/2022: pressure ulcer of right heel Electronic Signature(s) Signed: 10/22/2022 10:56:23 AM By: Duanne Guess MD FACS Entered By: Duanne Guess on 10/22/2022 10:56:23 -------------------------------------------------------------------------------- HPI Details Patient Name: Date of Service: Kimberly [name], Kimberly TTIE B. 10/22/2022 10:45 A M Medical Record Number: [id number] Patient Account Number: [id number] Date of Birth/Sex: Treating RN: 1932-12-08 (89 y.o. F) Primary Care Provider: Cranford Mon Other Clinician: Referring Provider: Treating Provider/Extender: [name] in Treatment: 6 History of Present Illness HPI Description: ADMISSION 04/29/2022 This is an 89 year old type II diabetic (no hemoglobin A1c available for review) who also has hypertension, neuropathy, peripheral angiopathy, chronic kidney disease, among other medical comorbidities. Her history is somewhat difficult to put together as she is extremely hard of hearing and according to her son, who accompanies her today, she does not reliably see physicians. There are apparently some concerns for self-neglect, but the family dynamics are complicated. She has 2+ pitting edema bilaterally with bilateral erythema suggestive of stasis dermatitis. What little  outside information was sent ahead indicates that she was thought to potentially have cellulitis and is she is currently taking Keflex for this. She also has open wounds on her right lower extremity on both the anterior, posterior, and medial aspects. They vary in depth but all have slough and nonviable subcutaneous tissue present. She has a small pressure ulcer on her left gluteus, just adjacent to the natal cleft. 05/06/2022: The anterior lower leg wound is healed. The posterior and medial lower leg wounds have accumulated a substantial amount of slough. Edema control bilaterally is markedly improved from last week. The pressure ulcer on her left buttock is smaller and quite clean with just a small amount of slough and eschar present. 05/14/2022: The gluteal ulcer is healed. The ulcers on her posterior and medial lower leg are cleaner but still with a fair amount of slough buildup. They are less tender than on prior visits. Edema control bilaterally is good. 05/24/2022: The ulcers on her right lower leg are smaller and cleaner again today. Significantly less slough accumulation. Edema control is good. 06/04/2022: All of her wounds are nearly healed. The remaining open areas are small and superficial with just a little slough and eschar present. Edema control is excellent. 06/15/2022: Her wounds are healed. READMISSION 09/10/2022: She returns today with a stage III pressure ulcer on the right heel. She is clearly been adhering to my recommendation that she keep her feet elevated, but she has been propping her foot up on an ottoman and as a result, has had tissue breakdown at the posterior calcaneus. She has been wearing a bunny boot type slipper in bed at night, but this has caused an abrasion on her medial right lower leg. There is slough on the heel; at the ankle wound is clean. Kimberly, [name] ([id number]) [id number] Page 2 of 7 09/17/2022: The ankle wound has healed. The  heel ulcer is measuring smaller and has just a small amount  of slough on the surface. Edema control is good. 6/7; patient has a pressure ulcer on the right lateral heel. She comes in today complaining of increasing pain with our intake nurse noting purulent drainage when she would change the dressing. The patient has a temperature of 99.1 otherwise her vitals are stable. Because of her hearing loss it is hard to really have a conversation although it is fairly reliable that she thinks the pain is more intense 10/05/2022: There is a large thick layer of eschar overlying her wound. Underneath, it is about half a centimeter in diameter, very superficial, and clean. 10/15/2022: There is a layer of thin eschar overlying the wound. Underneath, there are just a couple of millimeters of residual opening. 10/22/2022: Her wound is healed. Electronic Signature(s) Signed: 10/22/2022 10:56:39 AM By: Duanne Guess MD FACS Entered By: Duanne Guess on 10/22/2022 10:56:38 -------------------------------------------------------------------------------- Physical Exam Details Patient Name: Date of Service: Kimberly [name], Kimberly TTIE B. 10/22/2022 10:45 A M Medical Record Number: [id number] Patient Account Number: [id number] Date of Birth/Sex: Treating RN: [birth year] (89 y.o. F) Primary Care Provider: Cranford Mon Other Clinician: Referring Provider: Treating Provider/Extender: Eloy End Weeks in Treatment: 6 Constitutional Slightly hypertensive. . . . no acute distress. Respiratory Normal work of breathing on room air. Notes 10/22/2022: Her wound is healed. Electronic Signature(s) Signed: 10/22/2022 10:57:13 AM By: Duanne Guess MD FACS Entered By: Duanne Guess on 10/22/2022 10:57:13 -------------------------------------------------------------------------------- Physician Orders Details Patient Name: Date of Service: Kimberly [name], Kimberly TTIE B. 10/22/2022 10:45 A M Medical Record Number:  [id number] Patient Account Number: [id number] Date of Birth/Sex: Treating RN: 1932-06-23 (89 y.o. [name] Primary Care Provider: Cranford Mon Other Clinician: Referring Provider: Treating Provider/Extender: [name] in Treatment: 6 Verbal / Phone Orders: No Diagnosis Coding ICD-10 Coding Code Description [phone] Pressure ulcer of right heel, stage 3 I87.2 Venous insufficiency (chronic) (peripheral) E11.621 Type 2 diabetes mellitus with foot ulcer Discharge From [facility] Services Discharge from [facility] - Congratulations!!!!! [place], Swede Heaven B ([id number]) [id number] Page 3 of 7 Anesthetic (In clinic) Topical Lidocaine 4% applied to wound bed Off-Loading Other: - continue to float heel Patient Medications llergies: No Known Allergies A Notifications Medication Indication Start End 10/22/2022 lidocaine DOSE topical 4 % cream - cream topical Electronic Signature(s) Signed: 10/22/2022 11:26:06 AM By: Duanne Guess MD FACS Entered By: Duanne Guess on 10/22/2022 10:57:24 -------------------------------------------------------------------------------- Problem List Details Patient Name: Date of Service: [name], Kimberly TTIE B. 10/22/2022 10:45 A M Medical Record Number: [id number] Patient Account Number: [id number] Date of Birth/Sex: Treating RN: 06/01/1932 (89 y.o. F) Primary Care Provider: Cranford Mon Other Clinician: Referring Provider: Treating Provider/Extender: [name] in Treatment: 6 Active Problems ICD-10 Encounter Code Description Active Date MDM Diagnosis L89.613 Pressure ulcer of right heel, stage 3 09/10/2022 No Yes I87.2 Venous insufficiency (chronic) (peripheral) 09/10/2022 No Yes E11.621 Type 2 diabetes mellitus with foot ulcer 09/10/2022 No Yes Inactive Problems Resolved Problems ICD-10 Code Description Active Date Resolved Date L97.811 Non-pressure chronic ulcer  of other part of right lower leg limited to breakdown of skin 09/10/2022 09/10/2022 Electronic Signature(s) Signed: 10/22/2022 10:54:36 AM By: Duanne Guess MD FACS Entered By: Duanne Guess on 10/22/2022 10:54:36 Fayrene Fearing, Kaya B ([id number]) [id number] Page 4 of 7 -------------------------------------------------------------------------------- Progress Note Details Patient Name: Date of Service: [facility] [name], [place] TTIE B. 10/22/2022 10:45 A M Medical Record Number: [id number] Patient Account Number: [id number] Date of Birth/Sex: Treating RN: May 31, 1932 (89  y.o. F) Primary Care Provider: Cranford Mon Other Clinician: Referring Provider: Treating Provider/Extender: [name] in Treatment: 6 Subjective Chief Complaint Information obtained from Patient Patient presents for treatment of multiple open ulcers due to suspected venous insufficiency and a gluteal pressure ulcer 09/10/2022: pressure ulcer of right heel History of Present Illness (HPI) ADMISSION 04/29/2022 This is an 89 year old type II diabetic (no hemoglobin A1c available for review) who also has hypertension, neuropathy, peripheral angiopathy, chronic kidney disease, among other medical comorbidities. Her history is somewhat difficult to put together as she is extremely hard of hearing and according to her son, who accompanies her today, she does not reliably see physicians. There are apparently some concerns for self-neglect, but the family dynamics are complicated. She has 2+ pitting edema bilaterally with bilateral erythema suggestive of stasis dermatitis. What little outside information was sent ahead indicates that she was thought to potentially have cellulitis and is she is currently taking Keflex for this. She also has open wounds on her right lower extremity on both the anterior, posterior, and medial aspects. They vary in depth but all have slough and nonviable subcutaneous  tissue present. She has a small pressure ulcer on her left gluteus, just adjacent to the natal cleft. 05/06/2022: The anterior lower leg wound is healed. The posterior and medial lower leg wounds have accumulated a substantial amount of slough. Edema control bilaterally is markedly improved from last week. The pressure ulcer on her left buttock is smaller and quite clean with just a small amount of slough and eschar present. 05/14/2022: The gluteal ulcer is healed. The ulcers on her posterior and medial lower leg are cleaner but still with a fair amount of slough buildup. They are less tender than on prior visits. Edema control bilaterally is good. 05/24/2022: The ulcers on her right lower leg are smaller and cleaner again today. Significantly less slough accumulation. Edema control is good. 06/04/2022: All of her wounds are nearly healed. The remaining open areas are small and superficial with just a little slough and eschar present. Edema control is excellent. 06/15/2022: Her wounds are healed. READMISSION 09/10/2022: She returns today with a stage III pressure ulcer on the right heel. She is clearly been adhering to my recommendation that she keep her feet elevated, but she has been propping her foot up on an ottoman and as a result, has had tissue breakdown at the posterior calcaneus. She has been wearing a bunny boot type slipper in bed at night, but this has caused an abrasion on her medial right lower leg. There is slough on the heel; at the ankle wound is clean. 09/17/2022: The ankle wound has healed. The heel ulcer is measuring smaller and has just a small amount of slough on the surface. Edema control is good. 6/7; patient has a pressure ulcer on the right lateral heel. She comes in today complaining of increasing pain with our intake nurse noting purulent drainage when she would change the dressing. The patient has a temperature of 99.1 otherwise her vitals are stable. Because of her hearing loss  it is hard to really have a conversation although it is fairly reliable that she thinks the pain is more intense 10/05/2022: There is a large thick layer of eschar overlying her wound. Underneath, it is about half a centimeter in diameter, very superficial, and clean. 10/15/2022: There is a layer of thin eschar overlying the wound. Underneath, there are just a couple of millimeters of residual opening. 10/22/2022: Her  wound is healed. Patient History Information obtained from Patient. Family History Unknown History. Social History Never smoker, Marital Status - Widowed, Alcohol Use - Never, Drug Use - No History, Caffeine Use - Never. Medical History Eyes Patient has history of Cataracts Cardiovascular Patient has history of Hypertension Endocrine Patient has history of Type II Diabetes Musculoskeletal Patient has history of Osteoarthritis Hospitalization/Surgery History - cataract extraction. - rectocele repair. - abdominal hysterectomy. - breast surgery. - c-eye surgery procedure. - eye surgery. Medical A Surgical History Notes nd TIMOTHY, CORRIVEAU ([id number]) [id number] Page 5 of 7 Eyes diabetic retinopathy, macular degeneration Objective Constitutional Slightly hypertensive. no acute distress. Vitals Time Taken: 10:25 AM, Height: 65 in, Weight: 140 lbs, BMI: 23.3, Temperature: 97.9 F, Pulse: 71 bpm, Respiratory Rate: 18 breaths/min, Blood Pressure: 145/79 mmHg. Respiratory Normal work of breathing on room air. General Notes: 10/22/2022: Her wound is healed. Integumentary (Hair, Skin) Wound #4 status is Healed - Epithelialized. Original cause of wound was Pressure Injury. The date acquired was: 08/18/2022. The wound has been in treatment 6 weeks. The wound is located on the Right Calcaneus. The wound measures 0cm length x 0cm width x 0cm depth; 0cm^2 area and 0cm^3 volume. There is no tunneling or undermining noted. There is a none present amount of drainage  noted. The wound margin is distinct with the outline attached to the wound base. There is no granulation within the wound bed. There is no necrotic tissue within the wound bed. The periwound skin appearance had no abnormalities noted for texture. The periwound skin appearance had no abnormalities noted for moisture. The periwound skin appearance had no abnormalities noted for color. Periwound temperature was noted as No Abnormality. Assessment Active Problems ICD-10 Pressure ulcer of right heel, stage 3 Venous insufficiency (chronic) (peripheral) Type 2 diabetes mellitus with foot ulcer Plan Discharge From [facility] Services: Discharge from [facility] - Congratulations!!!!! Anesthetic: (In clinic) Topical Lidocaine 4% applied to wound bed Off-Loading: Other: - continue to float heel The following medication(s) was prescribed: lidocaine topical 4 % cream cream topical was prescribed at facility 10/22/2022: Her wound is healed. As it is quite freshly closed, I have recommended that she keep the area covered with a foam border dressing for the next week or so and wear her surgical sandal. She should continue to float the heel indefinitely. We will discharge her from the wound care center. She may follow- up as needed. Electronic Signature(s) Signed: 10/22/2022 10:58:11 AM By: Duanne Guess MD FACS Entered By: Duanne Guess on 10/22/2022 10:58:10 [name] ([id number]) [id number] Page 6 of 7 -------------------------------------------------------------------------------- HxROS Details Patient Name: Date of Service: [facility] [name], [name] 10/22/2022 10:45 A M Medical Record Number: [id number] Patient Account Number: [id number] Date of Birth/Sex: Treating RN: 1932/10/01 (89 y.o. F) Primary Care Provider: Cranford Mon Other Clinician: Referring Provider: Treating Provider/Extender: [name] in Treatment: 6 Information Obtained  From Patient Eyes Medical History: Positive for: Cataracts Past Medical History Notes: diabetic retinopathy, macular degeneration Cardiovascular Medical History: Positive for: Hypertension Endocrine Medical History: Positive for: Type II Diabetes Time with diabetes: 45 yrs Treated with: Insulin Blood sugar tested every day: Yes Tested : Musculoskeletal Medical History: Positive for: Osteoarthritis HBO Extended History Items Eyes: Cataracts Immunizations Pneumococcal Vaccine: Received Pneumococcal Vaccination: No Implantable Devices None Hospitalization / Surgery History Type of Hospitalization/Surgery cataract extraction rectocele repair abdominal hysterectomy breast surgery c-eye surgery procedure eye surgery Family and Social History Unknown History: Yes; Never smoker; Marital Status -  Widowed; Alcohol Use: Never; Drug Use: No History; Caffeine Use: Never; Financial Concerns: No; Food, Clothing or Shelter Needs: No; Support System Lacking: No; Transportation Concerns: No Electronic Signature(s) Signed: 10/22/2022 11:26:06 AM By: Duanne Guess MD FACS Entered By: Duanne Guess on 10/22/2022 10:56:45 Karger, Amilyah B ([id number]) [id number] Page 7 of 7 -------------------------------------------------------------------------------- SuperBill Details Patient Name: Date of Service: [facility], [name] 10/22/2022 Medical Record Number: [id number] Patient Account Number: [id number] Date of Birth/Sex: Treating RN: 09/16/1932 (89 y.o. [name] Primary Care Provider: Cranford Mon Other Clinician: Referring Provider: Treating Provider/Extender: [name] in Treatment: 6 Diagnosis Coding ICD-10 Codes Code Description [phone] Pressure ulcer of right heel, stage 3 I87.2 Venous insufficiency (chronic) (peripheral) E11.621 Type 2 diabetes mellitus with foot ulcer Facility Procedures : CPT4 Code:  [id number] Description: [id number] - WOUND CARE VISIT-LEV 2 EST PT Modifier: Quantity: 1 Physician Procedures : CPT4 Code Description Modifier [id number] - WC PHYS LEVEL 3 - EST PT ICD-10 Diagnosis Description L89.613 Pressure ulcer of right heel, stage 3 I87.2 Venous insufficiency (chronic) (peripheral) E11.621 Type 2 diabetes mellitus with foot ulcer Quantity: 1 Electronic Signature(s) Signed: 10/22/2022 10:59:19 AM By: Duanne Guess MD FACS Entered By: Duanne Guess on 10/22/2022 10:59:19

## 2022-10-28 DIAGNOSIS — H10503 Unspecified blepharoconjunctivitis, bilateral: Secondary | ICD-10-CM | POA: Diagnosis not present

## 2022-10-29 ENCOUNTER — Ambulatory Visit (HOSPITAL_BASED_OUTPATIENT_CLINIC_OR_DEPARTMENT_OTHER): Payer: Medicare Other | Admitting: General Surgery

## 2022-12-14 DIAGNOSIS — N1831 Chronic kidney disease, stage 3a: Secondary | ICD-10-CM | POA: Diagnosis not present

## 2022-12-14 DIAGNOSIS — I10 Essential (primary) hypertension: Secondary | ICD-10-CM | POA: Diagnosis not present

## 2022-12-14 DIAGNOSIS — E785 Hyperlipidemia, unspecified: Secondary | ICD-10-CM | POA: Diagnosis not present

## 2022-12-14 DIAGNOSIS — R809 Proteinuria, unspecified: Secondary | ICD-10-CM | POA: Diagnosis not present

## 2022-12-14 DIAGNOSIS — E1129 Type 2 diabetes mellitus with other diabetic kidney complication: Secondary | ICD-10-CM | POA: Diagnosis not present

## 2022-12-21 DIAGNOSIS — N1832 Chronic kidney disease, stage 3b: Secondary | ICD-10-CM | POA: Diagnosis not present

## 2022-12-21 DIAGNOSIS — I10 Essential (primary) hypertension: Secondary | ICD-10-CM | POA: Diagnosis not present

## 2022-12-21 DIAGNOSIS — E785 Hyperlipidemia, unspecified: Secondary | ICD-10-CM | POA: Diagnosis not present

## 2022-12-21 DIAGNOSIS — E1122 Type 2 diabetes mellitus with diabetic chronic kidney disease: Secondary | ICD-10-CM | POA: Diagnosis not present

## 2023-01-05 DIAGNOSIS — X32XXXD Exposure to sunlight, subsequent encounter: Secondary | ICD-10-CM | POA: Diagnosis not present

## 2023-01-05 DIAGNOSIS — L570 Actinic keratosis: Secondary | ICD-10-CM | POA: Diagnosis not present

## 2023-01-05 DIAGNOSIS — L718 Other rosacea: Secondary | ICD-10-CM | POA: Diagnosis not present

## 2023-02-01 DIAGNOSIS — H353222 Exudative age-related macular degeneration, left eye, with inactive choroidal neovascularization: Secondary | ICD-10-CM | POA: Diagnosis not present

## 2023-04-22 DIAGNOSIS — N1832 Chronic kidney disease, stage 3b: Secondary | ICD-10-CM | POA: Diagnosis not present

## 2023-04-22 DIAGNOSIS — E785 Hyperlipidemia, unspecified: Secondary | ICD-10-CM | POA: Diagnosis not present

## 2023-04-22 DIAGNOSIS — Z79899 Other long term (current) drug therapy: Secondary | ICD-10-CM | POA: Diagnosis not present

## 2023-04-22 DIAGNOSIS — I10 Essential (primary) hypertension: Secondary | ICD-10-CM | POA: Diagnosis not present

## 2023-04-22 DIAGNOSIS — E1129 Type 2 diabetes mellitus with other diabetic kidney complication: Secondary | ICD-10-CM | POA: Diagnosis not present

## 2023-04-29 DIAGNOSIS — E1122 Type 2 diabetes mellitus with diabetic chronic kidney disease: Secondary | ICD-10-CM | POA: Diagnosis not present

## 2023-04-29 DIAGNOSIS — N183 Chronic kidney disease, stage 3 unspecified: Secondary | ICD-10-CM | POA: Diagnosis not present

## 2023-04-29 DIAGNOSIS — I10 Essential (primary) hypertension: Secondary | ICD-10-CM | POA: Diagnosis not present

## 2023-07-06 DIAGNOSIS — X32XXXD Exposure to sunlight, subsequent encounter: Secondary | ICD-10-CM | POA: Diagnosis not present

## 2023-07-06 DIAGNOSIS — L718 Other rosacea: Secondary | ICD-10-CM | POA: Diagnosis not present

## 2023-07-06 DIAGNOSIS — L570 Actinic keratosis: Secondary | ICD-10-CM | POA: Diagnosis not present

## 2023-08-18 DIAGNOSIS — X32XXXD Exposure to sunlight, subsequent encounter: Secondary | ICD-10-CM | POA: Diagnosis not present

## 2023-08-18 DIAGNOSIS — L570 Actinic keratosis: Secondary | ICD-10-CM | POA: Diagnosis not present

## 2023-08-18 DIAGNOSIS — D0462 Carcinoma in situ of skin of left upper limb, including shoulder: Secondary | ICD-10-CM | POA: Diagnosis not present

## 2023-08-22 DIAGNOSIS — I10 Essential (primary) hypertension: Secondary | ICD-10-CM | POA: Diagnosis not present

## 2023-08-22 DIAGNOSIS — E1129 Type 2 diabetes mellitus with other diabetic kidney complication: Secondary | ICD-10-CM | POA: Diagnosis not present

## 2023-08-22 DIAGNOSIS — N1832 Chronic kidney disease, stage 3b: Secondary | ICD-10-CM | POA: Diagnosis not present

## 2023-08-22 DIAGNOSIS — Z79899 Other long term (current) drug therapy: Secondary | ICD-10-CM | POA: Diagnosis not present

## 2023-08-29 DIAGNOSIS — E1122 Type 2 diabetes mellitus with diabetic chronic kidney disease: Secondary | ICD-10-CM | POA: Diagnosis not present

## 2023-08-29 DIAGNOSIS — I10 Essential (primary) hypertension: Secondary | ICD-10-CM | POA: Diagnosis not present

## 2023-08-29 DIAGNOSIS — N183 Chronic kidney disease, stage 3 unspecified: Secondary | ICD-10-CM | POA: Diagnosis not present

## 2023-11-21 DIAGNOSIS — Z79899 Other long term (current) drug therapy: Secondary | ICD-10-CM | POA: Diagnosis not present

## 2023-11-21 DIAGNOSIS — I10 Essential (primary) hypertension: Secondary | ICD-10-CM | POA: Diagnosis not present

## 2023-11-21 DIAGNOSIS — E1129 Type 2 diabetes mellitus with other diabetic kidney complication: Secondary | ICD-10-CM | POA: Diagnosis not present

## 2023-11-21 DIAGNOSIS — N1832 Chronic kidney disease, stage 3b: Secondary | ICD-10-CM | POA: Diagnosis not present

## 2023-11-28 DIAGNOSIS — E1122 Type 2 diabetes mellitus with diabetic chronic kidney disease: Secondary | ICD-10-CM | POA: Diagnosis not present

## 2023-11-28 DIAGNOSIS — I10 Essential (primary) hypertension: Secondary | ICD-10-CM | POA: Diagnosis not present

## 2023-11-28 DIAGNOSIS — N1832 Chronic kidney disease, stage 3b: Secondary | ICD-10-CM | POA: Diagnosis not present

## 2023-12-01 DIAGNOSIS — L570 Actinic keratosis: Secondary | ICD-10-CM | POA: Diagnosis not present

## 2023-12-01 DIAGNOSIS — Z08 Encounter for follow-up examination after completed treatment for malignant neoplasm: Secondary | ICD-10-CM | POA: Diagnosis not present

## 2023-12-01 DIAGNOSIS — X32XXXD Exposure to sunlight, subsequent encounter: Secondary | ICD-10-CM | POA: Diagnosis not present

## 2023-12-01 DIAGNOSIS — Z85828 Personal history of other malignant neoplasm of skin: Secondary | ICD-10-CM | POA: Diagnosis not present

## 2024-05-16 ENCOUNTER — Ambulatory Visit
Admission: EM | Admit: 2024-05-16 | Discharge: 2024-05-16 | Disposition: A | Discharge status: Other Acute Inpatient Hospital

## 2024-05-16 ENCOUNTER — Inpatient Hospital Stay (HOSPITAL_COMMUNITY)
Admission: EM | Admit: 2024-05-16 | Discharge: 2024-05-18 | DRG: 947 | Disposition: A | Discharge status: Skilled Nursing Facility | Attending: Family Medicine | Admitting: Family Medicine

## 2024-05-16 ENCOUNTER — Encounter (HOSPITAL_COMMUNITY): Payer: Self-pay

## 2024-05-16 ENCOUNTER — Emergency Department (HOSPITAL_COMMUNITY)

## 2024-05-16 ENCOUNTER — Other Ambulatory Visit: Payer: Self-pay

## 2024-05-16 DIAGNOSIS — H9190 Unspecified hearing loss, unspecified ear: Secondary | ICD-10-CM | POA: Diagnosis present

## 2024-05-16 DIAGNOSIS — I700 Atherosclerosis of aorta: Secondary | ICD-10-CM | POA: Diagnosis present

## 2024-05-16 DIAGNOSIS — S12100A Unspecified displaced fracture of second cervical vertebra, initial encounter for closed fracture: Principal | ICD-10-CM | POA: Diagnosis present

## 2024-05-16 DIAGNOSIS — W19XXXA Unspecified fall, initial encounter: Secondary | ICD-10-CM | POA: Diagnosis not present

## 2024-05-16 DIAGNOSIS — H3530 Unspecified macular degeneration: Secondary | ICD-10-CM | POA: Diagnosis present

## 2024-05-16 DIAGNOSIS — S0003XA Contusion of scalp, initial encounter: Secondary | ICD-10-CM | POA: Diagnosis present

## 2024-05-16 DIAGNOSIS — E113293 Type 2 diabetes mellitus with mild nonproliferative diabetic retinopathy without macular edema, bilateral: Secondary | ICD-10-CM | POA: Diagnosis present

## 2024-05-16 DIAGNOSIS — R627 Adult failure to thrive: Secondary | ICD-10-CM | POA: Diagnosis present

## 2024-05-16 DIAGNOSIS — I6523 Occlusion and stenosis of bilateral carotid arteries: Secondary | ICD-10-CM | POA: Diagnosis present

## 2024-05-16 DIAGNOSIS — Z833 Family history of diabetes mellitus: Secondary | ICD-10-CM | POA: Diagnosis not present

## 2024-05-16 DIAGNOSIS — N390 Urinary tract infection, site not specified: Secondary | ICD-10-CM | POA: Diagnosis present

## 2024-05-16 DIAGNOSIS — Z7984 Long term (current) use of oral hypoglycemic drugs: Secondary | ICD-10-CM | POA: Diagnosis not present

## 2024-05-16 DIAGNOSIS — E785 Hyperlipidemia, unspecified: Secondary | ICD-10-CM | POA: Diagnosis present

## 2024-05-16 DIAGNOSIS — Y92009 Unspecified place in unspecified non-institutional (private) residence as the place of occurrence of the external cause: Secondary | ICD-10-CM | POA: Diagnosis not present

## 2024-05-16 DIAGNOSIS — Z91041 Radiographic dye allergy status: Secondary | ICD-10-CM | POA: Diagnosis not present

## 2024-05-16 DIAGNOSIS — Z794 Long term (current) use of insulin: Secondary | ICD-10-CM | POA: Diagnosis not present

## 2024-05-16 DIAGNOSIS — Y92019 Unspecified place in single-family (private) house as the place of occurrence of the external cause: Secondary | ICD-10-CM

## 2024-05-16 DIAGNOSIS — R531 Weakness: Secondary | ICD-10-CM | POA: Diagnosis present

## 2024-05-16 DIAGNOSIS — Z8249 Family history of ischemic heart disease and other diseases of the circulatory system: Secondary | ICD-10-CM

## 2024-05-16 DIAGNOSIS — D72829 Elevated white blood cell count, unspecified: Secondary | ICD-10-CM | POA: Diagnosis present

## 2024-05-16 DIAGNOSIS — Z79899 Other long term (current) drug therapy: Secondary | ICD-10-CM | POA: Diagnosis not present

## 2024-05-16 DIAGNOSIS — G8911 Acute pain due to trauma: Principal | ICD-10-CM | POA: Diagnosis present

## 2024-05-16 DIAGNOSIS — S12101D Unspecified nondisplaced fracture of second cervical vertebra, subsequent encounter for fracture with routine healing: Secondary | ICD-10-CM | POA: Diagnosis not present

## 2024-05-16 DIAGNOSIS — R1110 Vomiting, unspecified: Secondary | ICD-10-CM | POA: Diagnosis present

## 2024-05-16 DIAGNOSIS — W010XXA Fall on same level from slipping, tripping and stumbling without subsequent striking against object, initial encounter: Secondary | ICD-10-CM | POA: Diagnosis present

## 2024-05-16 DIAGNOSIS — Z825 Family history of asthma and other chronic lower respiratory diseases: Secondary | ICD-10-CM | POA: Diagnosis not present

## 2024-05-16 DIAGNOSIS — G9341 Metabolic encephalopathy: Secondary | ICD-10-CM | POA: Diagnosis present

## 2024-05-16 DIAGNOSIS — I10 Essential (primary) hypertension: Secondary | ICD-10-CM | POA: Diagnosis present

## 2024-05-16 DIAGNOSIS — M542 Cervicalgia: Secondary | ICD-10-CM | POA: Diagnosis present

## 2024-05-16 LAB — URINALYSIS, ROUTINE W REFLEX MICROSCOPIC
Bilirubin Urine: NEGATIVE
Glucose, UA: NEGATIVE mg/dL
Hgb urine dipstick: NEGATIVE
Ketones, ur: NEGATIVE mg/dL
Nitrite: POSITIVE — AB
Protein, ur: NEGATIVE mg/dL
Specific Gravity, Urine: 1.017 (ref 1.005–1.030)
pH: 6 (ref 5.0–8.0)

## 2024-05-16 LAB — CBC WITH DIFFERENTIAL/PLATELET
Abs Immature Granulocytes: 0.09 10*3/uL — ABNORMAL HIGH (ref 0.00–0.07)
Basophils Absolute: 0.1 10*3/uL (ref 0.0–0.1)
Basophils Relative: 0 %
Eosinophils Absolute: 0 10*3/uL (ref 0.0–0.5)
Eosinophils Relative: 0 %
HCT: 42.4 % (ref 36.0–46.0)
Hemoglobin: 13.4 g/dL (ref 12.0–15.0)
Immature Granulocytes: 1 %
Lymphocytes Relative: 5 %
Lymphs Abs: 0.8 10*3/uL (ref 0.7–4.0)
MCH: 29.5 pg (ref 26.0–34.0)
MCHC: 31.6 g/dL (ref 30.0–36.0)
MCV: 93.4 fL (ref 80.0–100.0)
Monocytes Absolute: 0.9 10*3/uL (ref 0.1–1.0)
Monocytes Relative: 5 %
Neutro Abs: 15.5 10*3/uL — ABNORMAL HIGH (ref 1.7–7.7)
Neutrophils Relative %: 89 %
Platelets: 249 10*3/uL (ref 150–400)
RBC: 4.54 MIL/uL (ref 3.87–5.11)
RDW: 13.6 % (ref 11.5–15.5)
WBC: 17.4 10*3/uL — ABNORMAL HIGH (ref 4.0–10.5)
nRBC: 0 % (ref 0.0–0.2)

## 2024-05-16 LAB — BASIC METABOLIC PANEL WITH GFR
Anion gap: 14 (ref 5–15)
BUN: 26 mg/dL — ABNORMAL HIGH (ref 8–23)
CO2: 25 mmol/L (ref 22–32)
Calcium: 9.6 mg/dL (ref 8.9–10.3)
Chloride: 103 mmol/L (ref 98–111)
Creatinine, Ser: 1.14 mg/dL — ABNORMAL HIGH (ref 0.44–1.00)
GFR, Estimated: 46 mL/min — ABNORMAL LOW
Glucose, Bld: 87 mg/dL (ref 70–99)
Potassium: 4.2 mmol/L (ref 3.5–5.1)
Sodium: 142 mmol/L (ref 135–145)

## 2024-05-16 MED ORDER — ACETAMINOPHEN 325 MG PO TABS: 650.0000 mg | ORAL_TABLET | Freq: Four times a day (QID) | ORAL | Status: DC | PRN

## 2024-05-16 MED ORDER — MELATONIN 3 MG PO TABS
6.0000 mg | ORAL_TABLET | Freq: Every evening | ORAL | Status: DC | PRN
  Administered 2024-05-17: 6 mg via ORAL
  Filled 2024-05-16: qty 2

## 2024-05-16 MED ORDER — PROCHLORPERAZINE EDISYLATE 10 MG/2ML IJ SOLN: 5.0000 mg | Freq: Four times a day (QID) | INTRAMUSCULAR | Status: DC | PRN

## 2024-05-16 MED ORDER — SODIUM CHLORIDE 0.9 % IV SOLN
2.0000 g | INTRAVENOUS | Status: DC
  Administered 2024-05-17: 2 g via INTRAVENOUS
  Filled 2024-05-16: qty 20

## 2024-05-16 MED ORDER — KETOROLAC TROMETHAMINE 15 MG/ML IJ SOLN
15.0000 mg | Freq: Once | INTRAMUSCULAR | Status: AC
  Administered 2024-05-16: 15 mg via INTRAVENOUS
  Filled 2024-05-16: qty 1

## 2024-05-16 MED ORDER — SODIUM CHLORIDE 0.9 % IV SOLN
2.0000 g | Freq: Once | INTRAVENOUS | Status: AC
  Administered 2024-05-16: 2 g via INTRAVENOUS
  Filled 2024-05-16: qty 20

## 2024-05-16 MED ORDER — POLYETHYLENE GLYCOL 3350 17 G PO PACK: 17.0000 g | PACK | Freq: Every day | ORAL | Status: DC | PRN

## 2024-05-16 NOTE — ED Triage Notes (Addendum)
 Pt being seen in UC after fall at home. Pt granddaughter reports pt fell from standing at home, +head strike on door. Pt family reports fall occurred around 12-1 pm today. Pt -blood thinners. Pt family reports pt has had one episode of vomiting. Pt granddaughter reports pt has skin tear on L arm from fall, non-adhesive dressing applied. C-collar placed on pt. 20g PIV placed. Noticeable swelling to L side of face upon assessment.

## 2024-05-16 NOTE — H&P (Signed)
 " History and Physical  [name] FMW:[id number] DOB: 1932/09/11 DOA: 05/16/2024  Referring physician: Dr. Towana  PCP: Sheryle Carwin, MD  Outpatient Specialists: None. Patient coming from: Home.  Chief Complaint: Unwitnessed fall.  HPI: [name] is a 89 y.o. female with medical history significant for hypertension, type 2 diabetes, osteoarthritis, who presents to the ER due to an unwitnessed fall at home and altered mental status.  The patient lives at home with her grandson.  Reportedly, she tripped and fell.  She may have been opening the door to her room and it got caught up on her clothes.  Unclear if she hit her head on the door frame.  She was found on the floor.  No loss of consciousness reported.  This fall was associated with 2 episodes of vomiting prior to coming to the ER.  In the ER, alert, she is confused according to her son.  Noncontrast Head CT showed left frontal scalp hematoma and periorbital soft tissue edema.  No acute intracranial abnormality.  CT cervical spine without contrast showed acute nondisplaced type III fracture of the C2 vertebral body with possible extension to the left C2 transverse foramen.  Severe atherosclerotic plaque of the aortic arch and its branches including the carotid arteries within the neck.  EDP discussed the case with neurosurgery recommended hard collar.  Nonoperative.  Labs today notable for leukocytosis 17,000.  UA positive for pyuria.  Admitted by [facility], hospitalist service, for pain control and UTI.  ED Course: Temperature 98.3.  BP 131/68.  Pulse 83, respiratory 20, O2 saturation 98% on room air.  Review of Systems: Review of systems as noted in the HPI. All other systems reviewed and are negative.   Past Medical History:  Diagnosis Date   Arthritis    Back pain 01/30/2015   Diabetes mellitus without complication (HCC)    Diabetic retinopathy (HCC)    NPDR OU   Hematuria 12/25/2014   Hypertension    Macular degeneration     Dry OD, Wet OS   PONV (postoperative nausea and vomiting)    Urinary urgency 12/25/2014   UTI (lower urinary tract infection) 11/02/2013   Past Surgical History:  Procedure Laterality Date   ABDOMINAL HYSTERECTOMY  1960s   with bladder tack up   BREAST SURGERY     benign, right   C-EYE SURGERY PROCEDURE     CATARACT EXTRACTION W/PHACO  02/08/2012   Procedure: CATARACT EXTRACTION PHACO AND INTRAOCULAR LENS PLACEMENT (IOC);  Surgeon: Oneil T. Roz, MD;  Location: [facility];  Service: Ophthalmology;  Laterality: Left;  CDE:11.62   EYE SURGERY Bilateral    Cat Sx   RECTOCELE REPAIR  1985   [facility], [place]    Social History:  reports that she has never smoked. She has never used smokeless tobacco. She reports that she does not drink alcohol and does not use drugs.   Allergies[1]  Family History  Problem Relation Age of Onset   Diabetes Mother    Diabetes Brother    Diabetes Brother    Heart attack Brother    Cancer Brother        lung   Cancer Brother        lung   Birth defects Brother        passed away at age 67; hole in heart   Emphysema Brother       Prior to Admission medications  Medication Sig Start Date End Date Taking? Authorizing Provider  ACCU-CHEK AVIVA PLUS  test strip daily. 11/05/19   [provider]  acetaminophen  (TYLENOL ) 500 MG tablet Take 500 mg by mouth every 6 (six) hours as needed. For arthritis pain    [provider]  cephALEXin  (KEFLEX ) 500 MG capsule Take 1 capsule (500 mg total) by mouth 3 (three) times daily. Patient not taking: Reported on 12/31/2020 05/11/19   Signa Nest A, NP  gabapentin (NEURONTIN) 100 MG capsule Take 100 mg by mouth daily. Patient not taking: Reported on 05/16/2024    [provider]  hydrochlorothiazide (HYDRODIURIL) 25 MG tablet Take 25 mg by mouth daily. 07/06/19   [provider]  insulin glargine (LANTUS) 100 UNIT/ML injection Inject 35 Units into the skin daily.     [provider]  insulin glargine (LANTUS) 100 UNIT/ML injection  08/20/16   [provider]  losartan (COZAAR) 100 MG tablet Take 100 mg by mouth daily. 07/03/19   [provider]  losartan-hydrochlorothiazide (HYZAAR) 100-25 MG per tablet Take 1 tablet by mouth daily.    [provider]  metFORMIN (GLUCOPHAGE) 500 MG tablet Take 500 mg by mouth daily.    [provider]  metFORMIN (GLUCOPHAGE-XR) 500 MG 24 hr tablet  07/23/16   [provider]  olmesartan (BENICAR) 20 MG tablet Take 20 mg by mouth daily. Patient not taking: Reported on 05/16/2024    [provider]  rosuvastatin (CRESTOR) 20 MG tablet Take by mouth. 03/11/20   [provider]  SURE COMFORT INS SYR .5CC/30G 30G X 5/16 0.5 ML MISC USE AS DIRECTED WITHOLANTUS. 07/06/19   [provider]    Physical Exam: BP (!) 166/52   Pulse 71   Temp 98 F (36.7 C) (Oral)   Resp 17   SpO2 97%   General: 89 y.o. year-old female well developed well nourished in no acute distress.  Alert and oriented x 1.  Hard collar in place. Cardiovascular: Regular rate and rhythm with no rubs or gallops.  No thyromegaly or JVD noted.  No lower extremity edema. 2/4 pulses in all 4 extremities. Respiratory: Clear to auscultation with no wheezes or rales. Good inspiratory effort. Abdomen: Soft nontender nondistended with normal bowel sounds x4 quadrants. Muskuloskeletal: No cyanosis, clubbing or edema noted bilaterally Neuro: CN II-XII intact, strength, sensation, reflexes Skin: No ulcerative lesions noted or rashes Psychiatry: Judgement and insight appear altered. Mood is appropriate for condition and setting          Labs on Admission:  Basic Metabolic Panel: Recent Labs  Lab 05/16/24 1811  NA 142  K 4.2  CL 103  CO2 25  GLUCOSE 87  BUN 26*  CREATININE 1.14*  CALCIUM 9.6   Liver Function Tests: No results for input(s): AST, ALT, ALKPHOS, BILITOT, PROT, ALBUMIN  in the last 168 hours. No results for input(s): LIPASE, AMYLASE in the last 168 hours. No results for input(s): AMMONIA in the last 168 hours. CBC: Recent Labs  Lab 05/16/24 1811  WBC 17.4*  NEUTROABS 15.5*  HGB 13.4  HCT 42.4  MCV 93.4  PLT 249   Cardiac Enzymes: No results for input(s): CKTOTAL, CKMB, CKMBINDEX, TROPONINI in the last 168 hours.  BNP (last 3 results) No results for input(s): BNP in the last 8760 hours.  ProBNP (last 3 results) No results for input(s): PROBNP in the last 8760 hours.  CBG: No results for input(s): GLUCAP in the last 168 hours.  Radiological Exams on Admission: CT CHEST ABDOMEN PELVIS WO CONTRAST Result Date: 05/16/2024 CLINICAL  DATA:  Trauma.  Fall.  Patient found on the floor. EXAM: CT CHEST, ABDOMEN AND PELVIS WITHOUT CONTRAST TECHNIQUE: Multidetector CT imaging of the chest, abdomen and pelvis was performed following the standard protocol without IV contrast. RADIATION DOSE REDUCTION: This exam was performed according to the departmental dose-optimization program which includes automated exposure control, adjustment of the mA and/or kV according to patient size and/or use of iterative reconstruction technique. COMPARISON:  CT of the abdomen dated 02/06/2015. FINDINGS: Evaluation of this exam is limited in the absence of intravenous contrast. CT CHEST FINDINGS Cardiovascular: There is no cardiomegaly or pericardial effusion. Advanced 3 vessel coronary vascular calcification. Coronary stent noted in the LAD. There is calcification of the mitral annulus. Moderate atherosclerotic calcification of the thoracic aorta. No aneurysmal dilatation. The central pulmonary arteries are grossly unremarkable. Mediastinum/Nodes: No hilar or mediastinal adenopathy. The esophagus is grossly unremarkable. No mediastinal fluid collection. Lungs/Pleura: No focal consolidation, pleural effusion, pneumothorax. The central airways are patent.  Musculoskeletal: Osteopenia with degenerative changes of the spine. No acute osseous pathology. CT ABDOMEN PELVIS FINDINGS No intra-abdominal free air or free fluid. Hepatobiliary: The liver is unremarkable. No biliary ductal dilatation. Layering stones in the gallbladder. No pericholecystic fluid or evidence of acute cholecystitis by CT. Pancreas: The pancreas is atrophic. No active inflammatory changes. No dilatation of the main pancreatic duct Spleen: Small splenic calcification. Adrenals/Urinary Tract: The adrenal glands are unremarkable. Small nonobstructing right renal calculi measure up to 3-4 mm. No hydronephrosis. The visualized ureters and urinary bladder appear unremarkable. Stomach/Bowel: Moderate stool throughout the colon. There is no bowel obstruction or active inflammation. The appendix is normal. Vascular/Lymphatic: Advanced aortoiliac atherosclerotic disease. The IVC is unremarkable. No portal venous gas. There is no adenopathy. Reproductive: Hysterectomy.  A 2.8 cm left pelvic cyst. Other: None Musculoskeletal: Osteopenia with degenerative changes of spine and grade 1 L5-S1 anterolisthesis. No acute osseous pathology. IMPRESSION: 1. No acute/traumatic intrathoracic, abdominal, or pelvic pathology. 2. Cholelithiasis. 3. Small nonobstructing right renal calculi. No hydronephrosis. 4.  Aortic Atherosclerosis (ICD10-I70.0). Electronically Signed   By: Vanetta Chou M.D.   On: 05/16/2024 20:12   CT Maxillofacial WO CM Result Date: 05/16/2024 EXAM: CT OF THE FACE WITHOUT CONTRAST 05/16/2024 05:51:04 PM TECHNIQUE: CT of the face was performed without the administration of intravenous contrast. Multiplanar reformatted images are provided for review. Automated exposure control, iterative reconstruction, and/or weight based adjustment of the mA/kV was utilized to reduce the radiation dose to as low as reasonably achievable. COMPARISON: None available. CLINICAL HISTORY: Facial trauma, blunt. FINDINGS:  FACIAL BONES: Patient is edentulous. No acute facial fracture. No mandibular dislocation. No suspicious bone lesion. ORBITS: Globes are intact. No acute traumatic injury. No inflammatory change. SINUSES AND MASTOIDS: No acute abnormality. SOFT TISSUES: No acute abnormality. IMPRESSION: 1. No acute facial fracture. Electronically signed by: Morgane Naveau MD 05/16/2024 06:27 PM EST RP Workstation: [id number]   CT Cervical Spine Wo Contrast Result Date: 05/16/2024 EXAM: CT CERVICAL SPINE WITHOUT CONTRAST 05/16/2024 05:51:04 PM TECHNIQUE: CT of the cervical spine was performed without the administration of intravenous contrast. Multiplanar reformatted images are provided for review. Automated exposure control, iterative reconstruction, and/or weight based adjustment of the mA/kV was utilized to reduce the radiation dose to as low as reasonably achievable. COMPARISON: None available. CLINICAL HISTORY: Neck trauma (Age >= 65y). Neck trauma. Age >= 65 years. FINDINGS: BONES AND ALIGNMENT: Vague lucency and cortical break of the C2 vertebral body along the odontoid into the lateral masses of C2 suggestive of acute  nondisplaced type 3 fracture (6:45 ; 4:33 - 34). Possible extension to the C2 left transverse foramina (8:24). Grade 1 anterolisthesis of C4 on C5. Diffusely decreased bone density. DEGENERATIVE CHANGES: Multilevel moderate degenerative changes of the spine. SOFT TISSUES: No prevertebral soft tissue swelling. Severe atherosclerotic plaque of the aortic arch and its branches including the carotid arteries within the neck. IMPRESSION: 1. Acute nondisplaced type 3 fracture of the C2 vertebral body with possible extension to the left C2 transverse foramen. 2. Severe atherosclerotic plaque of the aortic arch and its branches including the carotid arteries within the neck. Electronically signed by: Morgane Naveau MD 05/16/2024 06:21 PM EST RP Workstation: [id number]   CT Head Wo Contrast Result Date:  05/16/2024 EXAM: CT HEAD WITHOUT CONTRAST 05/16/2024 05:51:04 PM TECHNIQUE: CT of the head was performed without the administration of intravenous contrast. Automated exposure control, iterative reconstruction, and/or weight based adjustment of the mA/kV was utilized to reduce the radiation dose to as low as reasonably achievable. COMPARISON: None available. CLINICAL HISTORY: Head trauma, minor (Age >= 65 years). FINDINGS: BRAIN AND VENTRICLES: No acute hemorrhage. No evidence of acute infarct. No hydrocephalus. No extra-axial collection. No mass effect or midline shift. Age-related atrophy. Moderate periventricular and deep white matter hypodensity consistent with chronic small vessel ischemia. ORBITS: Bilateral lens resection. No definite intraconal fat stranding bilaterally. SINUSES: Mild mucosal thickening in ethmoid air cells. SOFT TISSUES AND SKULL: Left frontal scalp hematoma and periorbital soft tissue edema. Atherosclerosis of skullbase vasculature. No skull fracture. IMPRESSION: 1. Left frontal scalp hematoma and periorbital soft tissue edema. 2. No acute intracranial abnormality. Electronically signed by: Morgane Naveau MD 05/16/2024 06:02 PM EST RP Workstation: [id number]   DG Knee Complete 4 Views Left Result Date: 05/16/2024 EXAM: 4 OR MORE VIEW(S) XRAY OF THE LEFT KNEE 05/16/2024 05:32:00 PM COMPARISON: None available. CLINICAL HISTORY: Fall pain. FINDINGS: BONES AND JOINTS: No acute fracture. No malalignment. Diffuse osteopenia. Mild to moderate patellofemoral joint space loss. Mild medial compartment joint space loss. Moderate to severe lateral compartment joint space loss. Osteophyte formation noted. Trace joint effusion. SOFT TISSUES: Peripheral vascular atherosclerosis. Unremarkable. IMPRESSION: 1. Trace knee joint effusion. No acute fracture or dislocation. 2. Tricompartmental osteoarthritis, worst within the lateral compartment, as described above. Electronically signed by: Rogelia Myers MD  05/16/2024 05:51 PM EST RP Workstation: [id number]   DG Forearm Left Result Date: 05/16/2024 EXAM: 2 VIEW(S) XRAY OF THE LEFT FOREARM 05/16/2024 05:32:00 PM COMPARISON: None available. CLINICAL HISTORY: Fall pain. FINDINGS: BONES AND JOINTS: Diffuse osteopenia. No acute fracture. No malalignment. SOFT TISSUES: Peripheral vascular atherosclerosis. IMPRESSION: 1. No acute fracture or dislocation. Electronically signed by: Rogelia Myers MD 05/16/2024 05:48 PM EST RP Workstation: [id number]    EKG: I independently viewed the EKG done and my findings are as followed: None available at the time of this visit.  Assessment/Plan Present on Admission:  Fall  Principal Problem:   Fall  Acute nondisplaced type III fracture of the C2 vertebral body with possible extension to the left C2 transverse foramen post unwitnessed fall, POA CT cervical spine without contrast showed acute nondisplaced type III fracture of the C2 vertebral body with possible extension to the left C2 transverse foramen.  Severe atherosclerotic plaque of the aortic arch and its branches including the carotid arteries within the neck. Pain control PT OT evaluation Fall precautions.  Presumptive UTI, POA UA positive for pyuria Rocephin  empirically Follow urine culture  Leukocytosis suspect multifactorial secondary to the fracture and UTI Presented with WBC of 17.4 Continue  to treat underlying conditions. Repeat CBC in the morning  Acute metabolic encephalopathy secondary to the above Continue to treat underlying conditions Per her son at bedside, no diagnosis of dementia, however, has had some forgetfulness Aspiration and fall precaution.   Time: 75 minutes.   DVT prophylaxis: SCDs.  Code Status: Full code.  Family Communication: Her first son at bedside.  Disposition Plan: Admitted to telemetry unit.  Consults called: None at bedside.  Admission status: Inpatient status.   Status is: Inpatient The patient  requires at least 2 midnights for further evaluation and treatment of present condition.   Terry LOISE Hurst MD [facility] Pager [phone]  If 7PM-7AM, please contact night-coverage [email or web address] Password [email or web address]  05/16/2024, 11:49 PM      [1]  Allergies Allergen Reactions   Iodine Other (See Comments)    Pt granddaughter reports pt is allergic to iodine, unsure of reaction   "

## 2024-05-16 NOTE — ED Notes (Signed)
 Patient is being discharged from the Urgent Care and sent to the Emergency Department via EMS . Per DEVONNA Hammersmith, patient is in need of higher level of care due to fall at home, vomiting. Patient is aware and verbalizes understanding of plan of care.  Vitals:   05/16/24 1552  BP: (!) 177/94  Pulse: 71  Resp: 16  Temp: (!) 97.4 F (36.3 C)  SpO2: 95%

## 2024-05-16 NOTE — ED Triage Notes (Signed)
 Pt arrived by [facility] cc fall was found on the carpet floor  today around 4pm and called EMS. Pts family endorses that she possibly hit her head on the door. Denies blood thinners. PMH of diabetic and HTN. Pts family endorses that the pts metal status is decreased at base line she is Aox4 with vomiting x2 and nauseous. EMS endorses that they noted NSR on the monitor with BGL of 83 pts normal per family (130) and BP 176/68. EMS endorses pt has contusion above the left eye, bridge of nose, and skin tear on left elbow.  No high blood pressure meds taken today.

## 2024-05-16 NOTE — ED Provider Notes (Signed)
 "  EMERGENCY DEPARTMENT AT [facility] Provider Note   CSN: [id number] Arrival date & time: 05/16/24  8371     Patient presents with: [name]   [name] is a 89 y.o. female.  She is brought in by ambulance after a fall at home today.  It sounds like she may have been opening the door to her room and got caught up on her robe.  Possibly hit her head on the door frame.  Family states she is at her baseline mental status and very hard of hearing.  She did endorse headache and neck pain and vomited twice.  {Add pertinent medical, surgical, social history, OB history to YEP:67052} The history is provided by the patient and a relative.  Fall This is a new problem. The problem has not changed since onset.Associated symptoms include headaches. Pertinent negatives include no chest pain, no abdominal pain and no shortness of breath. Nothing aggravates the symptoms. Nothing relieves the symptoms. She has tried nothing for the symptoms. The treatment provided no relief.       Prior to Admission medications  Medication Sig Start Date End Date Taking? Authorizing Provider  ACCU-CHEK AVIVA PLUS test strip daily. 11/05/19   [provider]  acetaminophen  (TYLENOL ) 500 MG tablet Take 500 mg by mouth every 6 (six) hours as needed. For arthritis pain    [provider]  cephALEXin  (KEFLEX ) 500 MG capsule Take 1 capsule (500 mg total) by mouth 3 (three) times daily. Patient not taking: Reported on 12/31/2020 05/11/19   Signa Nest A, NP  gabapentin (NEURONTIN) 100 MG capsule Take 100 mg by mouth daily. Patient not taking: Reported on 05/16/2024    [provider]  hydrochlorothiazide (HYDRODIURIL) 25 MG tablet Take 25 mg by mouth daily. 07/06/19   [provider]  insulin glargine (LANTUS) 100 UNIT/ML injection Inject 35 Units into the skin daily.     [provider]  insulin glargine (LANTUS) 100 UNIT/ML injection  08/20/16   [provider]  losartan (COZAAR) 100 MG tablet Take 100 mg by mouth daily. 07/03/19   [provider]  losartan-hydrochlorothiazide (HYZAAR) 100-25 MG per tablet Take 1 tablet by mouth daily.    [provider]  metFORMIN (GLUCOPHAGE) 500 MG tablet Take 500 mg by mouth daily.    [provider]  metFORMIN (GLUCOPHAGE-XR) 500 MG 24 hr tablet  07/23/16   [provider]  olmesartan (BENICAR) 20 MG tablet Take 20 mg by mouth daily. Patient not taking: Reported on 05/16/2024    [provider]  rosuvastatin (CRESTOR) 20 MG tablet Take by mouth. 03/11/20   [provider]  SURE COMFORT INS SYR .5CC/30G 30G X 5/16 0.5 ML MISC USE AS DIRECTED WITHOLANTUS. 07/06/19   [provider]    Allergies: Iodine    Review of Systems  Respiratory:  Negative for shortness of breath.   Cardiovascular:  Negative for chest pain.  Gastrointestinal:  Negative for abdominal pain.  Neurological:  Positive for headaches.    Updated Vital Signs BP (!) 189/60   Pulse 60   Temp (!) 96.4 F (35.8 C) (Axillary)   Resp 14   SpO2 98%   Physical Exam Vitals and nursing note reviewed.  Constitutional:      General: She is not in acute distress.    Appearance: Normal appearance. She is well-developed.  HENT:     Head: Normocephalic.     Comments: She has some bruising left  forehead around her left eye Eyes:     Conjunctiva/sclera: Conjunctivae normal.  Neck:     Comments: In cervical collar trach midline Cardiovascular:     Rate and Rhythm: Normal rate and regular rhythm.     Heart sounds: No murmur heard. Pulmonary:     Effort: Pulmonary effort is normal. No respiratory distress.     Breath sounds: Normal breath sounds.  Abdominal:     Palpations: Abdomen is soft.     Tenderness: There is no abdominal tenderness. There is no guarding or rebound.  Musculoskeletal:        General: Tenderness present. No deformity.     Comments: Skin tear left  forearm, abrasions left knee.  Skin:    General: Skin is warm and dry.     Capillary Refill: Capillary refill takes less than 2 seconds.  Neurological:     General: No focal deficit present.     Mental Status: She is alert.     Motor: No weakness.     (all labs ordered are listed, but only abnormal results are displayed) Labs Reviewed  BASIC METABOLIC PANEL WITH GFR  CBC WITH DIFFERENTIAL/PLATELET  CBG MONITORING, ED    EKG: None  Radiology: No results found.  {Document cardiac monitor, telemetry assessment procedure when appropriate:32947} Procedures   Medications Ordered in the ED - No data to display  Clinical Course as of 05/16/24 2149  Wed May 16, 2024  1836 CT showing C2 fracture.  Discussed with PA Meyran covering Dr. Onetha.  She has a hard collar at all times, does not need an angio.  Follow-up outpatient. [MB]  2149 Urine concerning for UTI.  Have started antibiotics.  Discussed with Dr. Shona [facility] for admission. [MB]    Clinical Course User Index [MB] Towana Ozell BROCKS, MD   {Click here for ABCD2, HEART and other calculators REFRESH Note before signing:1}                              Medical Decision Making Amount and/or Complexity of Data Reviewed Labs: ordered. Radiology: ordered.  Risk Decision regarding hospitalization.   This patient complains of ***; this involves an extensive number of treatment Options and is a complaint that carries with it a high risk of complications and morbidity. The differential includes ***  I ordered, reviewed and interpreted labs, which included *** I ordered medication *** and reviewed PMP when indicated. I ordered imaging studies which included *** and I independently    visualized and interpreted imaging which showed *** Additional history obtained from *** Previous records obtained and reviewed *** I consulted *** and discussed lab and imaging findings and discussed disposition.  Cardiac monitoring  reviewed, *** Social determinants considered, *** Critical Interventions: ***  After the interventions stated above, I reevaluated the patient and found *** Admission and further testing considered, ***   {Document critical care time when appropriate  Document review of labs and clinical decision tools ie CHADS2VASC2, etc  Document your independent review of radiology images and any outside records  Document your discussion with family members, caretakers and with consultants  Document social determinants of health affecting pt's care  Document your decision making why or why not admission, treatments were needed:32947:::1}   Final diagnoses:  None    ED Discharge Orders     None        "

## 2024-05-17 DIAGNOSIS — R531 Weakness: Secondary | ICD-10-CM | POA: Diagnosis not present

## 2024-05-17 DIAGNOSIS — S12101D Unspecified nondisplaced fracture of second cervical vertebra, subsequent encounter for fracture with routine healing: Secondary | ICD-10-CM | POA: Diagnosis not present

## 2024-05-17 DIAGNOSIS — R627 Adult failure to thrive: Secondary | ICD-10-CM

## 2024-05-17 DIAGNOSIS — W19XXXA Unspecified fall, initial encounter: Secondary | ICD-10-CM | POA: Diagnosis not present

## 2024-05-17 DIAGNOSIS — S12100A Unspecified displaced fracture of second cervical vertebra, initial encounter for closed fracture: Secondary | ICD-10-CM | POA: Diagnosis present

## 2024-05-17 DIAGNOSIS — G9341 Metabolic encephalopathy: Secondary | ICD-10-CM

## 2024-05-17 DIAGNOSIS — D72829 Elevated white blood cell count, unspecified: Secondary | ICD-10-CM

## 2024-05-17 LAB — GLUCOSE, CAPILLARY
Glucose-Capillary: 104 mg/dL — ABNORMAL HIGH (ref 70–99)
Glucose-Capillary: 169 mg/dL — ABNORMAL HIGH (ref 70–99)

## 2024-05-17 MED ORDER — FENTANYL CITRATE (PF) 50 MCG/ML IJ SOSY
12.5000 ug | PREFILLED_SYRINGE | INTRAMUSCULAR | Status: DC | PRN
  Administered 2024-05-17 – 2024-05-18 (×5): 12.5 ug via INTRAVENOUS
  Filled 2024-05-17 (×5): qty 1

## 2024-05-17 MED ORDER — POLYETHYLENE GLYCOL 3350 17 G PO PACK
17.0000 g | PACK | Freq: Every day | ORAL | Status: DC
  Filled 2024-05-17: qty 1

## 2024-05-17 MED ORDER — OXYCODONE HCL 5 MG PO TABS
5.0000 mg | ORAL_TABLET | Freq: Four times a day (QID) | ORAL | Status: DC | PRN
  Administered 2024-05-17: 5 mg via ORAL
  Filled 2024-05-17: qty 1

## 2024-05-17 MED ORDER — HALOPERIDOL LACTATE 5 MG/ML IJ SOLN
1.0000 mg | INTRAMUSCULAR | Status: AC
  Administered 2024-05-17: 1 mg via INTRAVENOUS
  Filled 2024-05-17: qty 1

## 2024-05-17 MED ORDER — ACETAMINOPHEN 325 MG PO TABS
650.0000 mg | ORAL_TABLET | Freq: Four times a day (QID) | ORAL | Status: DC
  Administered 2024-05-17 – 2024-05-18 (×3): 650 mg via ORAL
  Filled 2024-05-17 (×5): qty 2

## 2024-05-17 NOTE — Plan of Care (Signed)
" °  Problem: Acute Rehab PT Goals(only PT should resolve) Goal: Pt Will Go Supine/Side To Sit Outcome: Progressing Flowsheets (Taken 05/17/2024 1427) Pt will go Supine/Side to Sit:  with minimal assist  with moderate assist Goal: Patient Will Transfer Sit To/From Stand Outcome: Progressing Flowsheets (Taken 05/17/2024 1427) Patient will transfer sit to/from stand:  with minimal assist  with moderate assist Goal: Pt Will Transfer Bed To Chair/Chair To Bed Outcome: Progressing Flowsheets (Taken 05/17/2024 1427) Pt will Transfer Bed to Chair/Chair to Bed:  with min assist  with mod assist Goal: Pt Will Ambulate Outcome: Progressing Flowsheets (Taken 05/17/2024 1427) Pt will Ambulate:  25 feet  with moderate assist  with rolling walker   2:28 PM, 05/17/24 Lynwood Music, MPT Physical Therapist with [facility] [phone] office [phone] mobile phone  "

## 2024-05-17 NOTE — Progress Notes (Addendum)
 Pt is Disoriented X4. She can tell stories from the past, and talks about falling in her house, but most conversation/words makes of no sense. She does say she's 89 years old, but not when asked or answering questions. Pt will not keep tele monitor on and removes leads, tries to take off bandages and wraps, and she continues to try and get out of the bed. Terry Hurst DO notified.  Fall Risk Bracelet on, matts down on both sides of the bed, bed rails up, bed is in lowest position, fall risk socks are on.

## 2024-05-17 NOTE — Hospital Course (Signed)
 89 y.o. female with medical history significant for hypertension, type 2 diabetes, osteoarthritis, who presents to the ER due to an unwitnessed fall at home and altered mental status.  The patient lives at home with her grandson.  Reportedly, she tripped and fell.  She may have been opening the door to her room and it got caught up on her clothes.  Unclear if she hit her head on the door frame.  She was found on the floor.  No loss of consciousness reported.  This fall was associated with 2 episodes of vomiting prior to coming to the ER.   In the ER, alert, she is confused according to her son.  Noncontrast Head CT showed left frontal scalp hematoma and periorbital soft tissue edema.  No acute intracranial abnormality.  CT cervical spine without contrast showed acute nondisplaced type III fracture of the C2 vertebral body with possible extension to the left C2 transverse foramen.  Severe atherosclerotic plaque of the aortic arch and its branches including the carotid arteries within the neck.   EDP discussed the case with neurosurgery recommended hard collar.  Nonoperative.  Labs today notable for leukocytosis 17,000.  UA positive for pyuria.   Admitted by [facility], hospitalist service, for pain control and UTI.

## 2024-05-17 NOTE — Plan of Care (Signed)
" °  Problem: Education: Goal: Knowledge of General Education information will improve Description: Including pain rating scale, medication(s)/side effects and non-pharmacologic comfort measures Outcome: Not Met (add Reason)   Problem: Health Behavior/Discharge Planning: Goal: Ability to manage health-related needs will improve Outcome: Not Met (add Reason)   Problem: Clinical Measurements: Goal: Ability to maintain clinical measurements within normal limits will improve Outcome: Progressing   Problem: Activity: Goal: Risk for activity intolerance will decrease Outcome: Not Met (add Reason)   Problem: Pain Managment: Goal: General experience of comfort will improve and/or be controlled Outcome: Not Met (add Reason)   Problem: Safety: Goal: Ability to remain free from injury will improve Outcome: Not Met (add Reason)   Problem: Skin Integrity: Goal: Risk for impaired skin integrity will decrease Outcome: Not Met (add Reason)   "

## 2024-05-17 NOTE — Plan of Care (Signed)
   Problem: Activity: Goal: Risk for activity intolerance will decrease Outcome: Progressing   Problem: Coping: Goal: Level of anxiety will decrease Outcome: Progressing   Problem: Pain Managment: Goal: General experience of comfort will improve and/or be controlled Outcome: Progressing

## 2024-05-17 NOTE — Evaluation (Signed)
 Occupational Therapy Evaluation Patient Details Name: [name] MRN: [id number] DOB: 1932-10-09 Today's Date: 05/17/2024   History of Present Illness   [name] is a 89 y.o. female with medical history significant for hypertension, type 2 diabetes, osteoarthritis, who presents to the ER due to an unwitnessed fall at home and altered mental status.  The patient lives at home with her grandson.  Reportedly, she tripped and fell.  She may have been opening the door to her room and it got caught up on her clothes.  Unclear if she hit her head on the door frame.  She was found on the floor.  No loss of consciousness reported.  This fall was associated with 2 episodes of vomiting prior to coming to the ER. (per DO.)     Clinical Impressions Pt lethargic but able to engage in OT and PT co-evaluation. Pt's son entered the room during the session and was able to provide background information since the pt was lethargic with clear cognitive deficits today. Pt required max A for bed mobility and mod to max for EOB to chair transfer with RW. Difficult to fully assess B UE but pt appears to be weak with near 75% to 90% available range for P/ROM of shoulder flexion. Pt demonstrates need for mod to total assist for most ADL's with cognition/arousal level likely contributing to pt's functional abilities. Pt left in the chair with chair alarm set and family present. Pt will benefit from continued OT in the hospital to increase strength, balance, and endurance for safe ADL's.        If plan is discharge home, recommend the following:   A lot of help with walking and/or transfers;A lot of help with bathing/dressing/bathroom;Assistance with cooking/housework;Assistance with feeding;Direct supervision/assist for medications management;Assist for transportation;Help with stairs or ramp for entrance;Supervision due to cognitive status     Functional Status Assessment   Patient has had a recent decline  in their functional status and demonstrates the ability to make significant improvements in function in a reasonable and predictable amount of time.     Equipment Recommendations   None recommended by OT             Precautions/Restrictions   Precautions Precautions: Fall Recall of Precautions/Restrictions: Impaired Restrictions Weight Bearing Restrictions Per Provider Order: No     Mobility Bed Mobility Overal bed mobility: Needs Assistance Bed Mobility: Supine to Sit     Supine to sit: Max assist     General bed mobility comments: labored effort; assist needed    Transfers Overall transfer level: Needs assistance Equipment used: Rolling walker (2 wheels) Transfers: Sit to/from Stand, Bed to chair/wheelchair/BSC Sit to Stand: Mod assist     Step pivot transfers: Mod assist, Max assist     General transfer comment: EOB to chair with RW      Balance Overall balance assessment: Needs assistance Sitting-balance support: No upper extremity supported, Feet supported Sitting balance-Leahy Scale: Fair Sitting balance - Comments: seated at EOB   Standing balance support: Bilateral upper extremity supported, During functional activity, Reliant on assistive device for balance Standing balance-Leahy Scale: Poor Standing balance comment: using RW                           ADL either performed or assessed with clinical judgement   ADL Overall ADL's : Needs assistance/impaired Eating/Feeding: Minimal assistance;Moderate assistance;Sitting   Grooming: Moderate assistance;Sitting;Maximal assistance   Upper Body Bathing:  Moderate assistance;Maximal assistance;Sitting   Lower Body Bathing: Maximal assistance;Total assistance;Sitting/lateral leans;Bed level   Upper Body Dressing : Maximal assistance;Sitting;Moderate assistance   Lower Body Dressing: Total assistance;Maximal assistance;Sitting/lateral leans   Toilet Transfer: Moderate  assistance;Maximal assistance;Rolling walker (2 wheels);Stand-pivot Statistician Details (indicate cue type and reason): EOB to chair with RW Toileting- Clothing Manipulation and Hygiene: Maximal assistance;Total assistance;Bed level               Vision Baseline Vision/History:  (Son reports the pt can't see. Reports the pt gets shots in her eye.) Ability to See in Adequate Light: 2 Moderately impaired Vision Assessment?: Vision impaired- to be further tested in functional context     Perception Perception: Not tested       Praxis Praxis: Not tested       Pertinent Vitals/Pain Pain Assessment Pain Assessment: Faces Faces Pain Scale: No hurt     Extremity/Trunk Assessment Upper Extremity Assessment Upper Extremity Assessment: Generalized weakness;Difficult to assess due to impaired cognition   Lower Extremity Assessment Lower Extremity Assessment: Defer to PT evaluation   Cervical / Trunk Assessment Cervical / Trunk Assessment: Kyphotic   Communication Communication Communication: Impaired Factors Affecting Communication: Reduced clarity of speech;Hearing impaired   Cognition Arousal: Lethargic Behavior During Therapy: Flat affect Cognition: Cognition impaired             OT - Cognition Comments: Pt not very verbal and lethargic intermittently.                 Following commands: Impaired Following commands impaired: Follows one step commands inconsistently     Cueing  General Comments   Cueing Techniques: Verbal cues;Tactile cues;Visual cues                 Home Living Family/patient expects to be discharged to:: Private residence Living Arrangements: Alone Available Help at Discharge: Family;Available PRN/intermittently Type of Home: House Home Access: Stairs to enter [employer] of Steps: 3 front and 5 back Entrance Stairs-Rails: Right;Left (front) Home Layout: One level     Bathroom Shower/Tub: Company Secretary:  (Son unsure.) Bathroom Accessibility: No   Home Equipment: Agricultural Consultant (2 wheels);Rollator (4 wheels)   Additional Comments: Information provided by pt's son.      Prior Functioning/Environment Prior Level of Function : Needs assist       Physical Assist : Mobility (physical) Mobility (physical): Transfers;Gait   Mobility Comments: Son reports pt uses RW at times in the home but not always. Household ambulator only. ADLs Comments: Son reports the pt is not assited but that the pt is struggling to complete her ADL's.    OT Problem List: Decreased strength;Decreased range of motion;Decreased activity tolerance;Impaired balance (sitting and/or standing);Impaired vision/perception;Decreased cognition;Decreased safety awareness;Decreased knowledge of use of DME or AE;Impaired UE functional use   OT Treatment/Interventions: Self-care/ADL training;Therapeutic exercise;Therapeutic activities;Patient/family education;Balance training;DME and/or AE instruction;Cognitive remediation/compensation;Visual/perceptual remediation/compensation      OT Goals(Current goals can be found in the care plan section)   Acute Rehab OT Goals Patient Stated Goal: Improve pt's function. OT Goal Formulation: With family Time For Goal Achievement: 05/31/24 Potential to Achieve Goals: Fair   OT Frequency:  Min 2X/week    Co-evaluation PT/OT/SLP Co-Evaluation/Treatment: Yes Reason for Co-Treatment: To address functional/ADL transfers   OT goals addressed during session: ADL's and self-care                       End of Session Equipment  Utilized During Treatment: Rolling walker (2 wheels)  Activity Tolerance: Patient tolerated treatment well Patient left: in chair;with call bell/phone within reach;with chair alarm set;with family/visitor present  OT Visit Diagnosis: Unsteadiness on feet (R26.81);Other abnormalities of gait and mobility (R26.89);Muscle weakness  (generalized) (M62.81);History of falling (Z91.81);Other symptoms and signs involving cognitive function                Time: 8968-8951 OT Time Calculation (min): 17 min Charges:  OT General Charges $OT Visit: 1 Visit OT Evaluation $OT Eval Low Complexity: 1 Low  Johnathyn Viscomi OT, MOT  Jayson Person 05/17/2024, 1:54 PM

## 2024-05-17 NOTE — NC FL2 (Signed)
 " [place]  MEDICAID FL2 LEVEL OF CARE FORM     IDENTIFICATION  Patient Name: [name] Birthdate: [birth year] Sex: female Admission Date (Current Location): 05/16/2024  [facility] and [place] Number:  [employer] and Address:  [facility],  [address] [place] [address]      Provider Number: [id number]  Attending Physician Name and Address:  Vicci Afton CROME, MD  Relative Name and Phone Number:       Current Level of Care: Hospital Recommended Level of Care: Skilled Nursing Facility Prior Approval Number:    Date Approved/Denied:   PASRR Number: [id number]  Discharge Plan: SNF    Current Diagnoses: Patient Active Problem List   Diagnosis Date Noted   C2 cervical fracture (HCC) 05/17/2024   Leukocytosis 05/17/2024   Acute metabolic encephalopathy 05/17/2024   Generalized weakness 05/17/2024   Failure to thrive in adult 05/17/2024   Fall 05/16/2024   Burning with urination 10/04/2016   Back pain 01/30/2015   Urinary urgency 12/25/2014   Hematuria 12/25/2014   UTI (urinary tract infection) 11/02/2013   Muscle weakness (generalized) 10/27/2010   Cervicalgia 10/27/2010   Pain in joint, shoulder region 10/27/2010    Orientation RESPIRATION BLADDER Height & Weight     Self  Normal Incontinent Weight:   Height:     BEHAVIORAL SYMPTOMS/MOOD NEUROLOGICAL BOWEL NUTRITION STATUS      Continent Diet (See D/C summary)  AMBULATORY STATUS COMMUNICATION OF NEEDS Skin   Extensive Assist Verbally Normal                       Personal Care Assistance Level of Assistance  Bathing, Feeding, Dressing Bathing Assistance: Limited assistance Feeding assistance: Independent Dressing Assistance: Limited assistance     Functional Limitations Info  Sight, Hearing, Speech Sight Info: Impaired Hearing Info: Impaired Speech Info: Adequate    SPECIAL CARE FACTORS FREQUENCY  PT (By licensed PT), OT (By licensed OT)     PT Frequency: 5  times weekly OT Frequency: 5 times weekly            Contractures Contractures Info: Not present    Additional Factors Info  Code Status, Allergies Code Status Info: FULL Allergies Info: Iodine           Current Medications (05/17/2024):  This is the current hospital active medication list Current Facility-Administered Medications  Medication Dose Route Frequency Provider Last Rate Last Admin   acetaminophen  (TYLENOL ) tablet 650 mg  650 mg Oral Q6H Johnson, Clanford L, MD       cefTRIAXone  (ROCEPHIN ) 2 g in sodium chloride  0.9 % 100 mL IVPB  2 g Intravenous Q24H Johnson, Clanford L, MD       fentaNYL  (SUBLIMAZE ) injection 12.5 mcg  12.5 mcg Intravenous Q2H PRN Johnson, Clanford L, MD   12.5 mcg at 05/17/24 1309   melatonin tablet 6 mg  6 mg Oral QHS PRN Shona Laurence N, DO       oxyCODONE  (Oxy IR/ROXICODONE ) immediate release tablet 5 mg  5 mg Oral Q6H PRN Johnson, Clanford L, MD       polyethylene glycol (MIRALAX  / GLYCOLAX ) packet 17 g  17 g Oral Daily Johnson, Clanford L, MD       prochlorperazine  (COMPAZINE ) injection 5 mg  5 mg Intravenous Q6H PRN Shona Laurence SAILOR, DO         Discharge Medications: Please see discharge summary for a list of discharge medications.  Relevant Imaging Results:  Relevant Lab Results:   Additional Information SSN: [id number] [place]     "

## 2024-05-17 NOTE — Plan of Care (Signed)
" °  Problem: Acute Rehab OT Goals (only OT should resolve) Goal: Pt. Will Perform Eating Flowsheets (Taken 05/17/2024 1357) Pt Will Perform Eating: with modified independence Goal: Pt. Will Perform Grooming Flowsheets (Taken 05/17/2024 1357) Pt Will Perform Grooming: with modified independence Goal: Pt. Will Perform Upper Body Dressing Flowsheets (Taken 05/17/2024 1357) Pt Will Perform Upper Body Dressing: with modified independence Goal: Pt. Will Perform Lower Body Dressing Flowsheets (Taken 05/17/2024 1357) Pt Will Perform Lower Body Dressing:  with min assist  sitting/lateral leans Goal: Pt. Will Transfer To Toilet Flowsheets (Taken 05/17/2024 1357) Pt Will Transfer to Toilet:  with contact guard assist  ambulating  stand pivot transfer Goal: Pt. Will Perform Toileting-Clothing Manipulation Flowsheets (Taken 05/17/2024 1357) Pt Will Perform Toileting - Clothing Manipulation and hygiene:  with contact guard assist  sitting/lateral leans  sit to/from stand Goal: Pt/Caregiver Will Perform Home Exercise Program Flowsheets (Taken 05/17/2024 1357) Pt/caregiver will Perform Home Exercise Program:  Increased strength  Increased ROM  Both right and left upper extremity  With Supervision  Bulmaro Feagans OT, MOT  "

## 2024-05-17 NOTE — ED Provider Notes (Signed)
 Patient presenting today with granddaughter for evaluation of headache, profuse vomiting, hematoma to left orbital region, skin abrasion to left arm, neck pain following a fall in the home earlier today where she hit her head on a door.  Granddaughter was concerned about taking her to the emergency department in hopes of avoiding flu exposures but discussed from registration desk that she would need to be further evaluated in the emergency department given her age and the severity of her injury and symptoms due to insufficient resources in this setting.  Granddaughter is readily agreeable to this and wishes for us  to call EMS for them which is also our recommendation.  EMS was called immediately for her, she was triaged and found to have stable vital signs apart from elevated blood pressure reading.  C-collar was immediately placed due to neck pain and fall, peripheral IV was started and EMS arrived very shortly after for transport to the emergency department.   Stuart Millman [place], [place] 05/17/24 [phone]

## 2024-05-17 NOTE — Evaluation (Signed)
 Physical Therapy Evaluation Patient Details Name: [name] MRN: [id number] DOB: 02/08/1933 Today's Date: 05/17/2024  History of Present Illness  [name] is a 89 y.o. female with medical history significant for hypertension, type 2 diabetes, osteoarthritis, who presents to the ER due to an unwitnessed fall at home and altered mental status.  The patient lives at home with her grandson.  Reportedly, she tripped and fell.  She may have been opening the door to her room and it got caught up on her clothes.  Unclear if she hit her head on the door frame.  She was found on the floor.  No loss of consciousness reported.  This fall was associated with 2 episodes of vomiting prior to coming to the ER.   Clinical Impression  Patient demonstrates slow labored movement for sitting up at bedside, once seated able to maintain sitting balance, very unsteady on feet and limited to a few shuffling side steps before having to sit due weakness and poor standing balance. Patient tolerated sitting up in chair after therapy with nursing staff and patient's son present. Patient will benefit from continued skilled physical therapy in hospital and recommended venue below to increase strength, balance, endurance for safe ADLs and gait.           If plan is discharge home, recommend the following: A lot of help with bathing/dressing/bathroom;A lot of help with walking and/or transfers;Help with stairs or ramp for entrance;Assist for transportation;Assistance with cooking/housework   Can travel by private vehicle   No    Equipment Recommendations None recommended by PT  Recommendations for Other Services       Functional Status Assessment Patient has had a recent decline in their functional status and demonstrates the ability to make significant improvements in function in a reasonable and predictable amount of time.     Precautions / Restrictions Precautions Precautions: Fall Recall of  Precautions/Restrictions: Impaired Restrictions Weight Bearing Restrictions Per Provider Order: No      Mobility  Bed Mobility Overal bed mobility: Needs Assistance Bed Mobility: Supine to Sit     Supine to sit: Max assist     General bed mobility comments: increased time, labored movement    Transfers Overall transfer level: Needs assistance Equipment used: Rolling walker (2 wheels) Transfers: Sit to/from Stand, Bed to chair/wheelchair/BSC Sit to Stand: Mod assist   Step pivot transfers: Mod assist, Max assist       General transfer comment: unsteady labored movement with difficulty advancing BLE due to weakness    Ambulation/Gait Ambulation/Gait assistance: Max assist Gait Distance (Feet): 4 Feet Assistive device: Rolling walker (2 wheels) Gait Pattern/deviations: Decreased step length - right, Decreased step length - left, Decreased stride length, Trunk flexed, Decreased stance time - right, Decreased stance time - left, Shuffle Gait velocity: slow     General Gait Details: limited to a few shuffling unsteady labored side steps requiring tactile assistance for moving BLE due to weakness, poor standing balance  Stairs            Wheelchair Mobility     Tilt Bed    Modified Rankin (Stroke Patients Only)       Balance Overall balance assessment: Needs assistance Sitting-balance support: Feet supported, No upper extremity supported Sitting balance-Leahy Scale: Fair Sitting balance - Comments: seated at EOB   Standing balance support: Bilateral upper extremity supported, During functional activity, Reliant on assistive device for balance Standing balance-Leahy Scale: Poor Standing balance comment: using RW  Pertinent Vitals/Pain Pain Assessment Pain Assessment: No/denies pain    Home Living Family/patient expects to be discharged to:: Private residence Living Arrangements: Alone Available Help at  Discharge: Family;Available PRN/intermittently Type of Home: House Home Access: Stairs to enter Entrance Stairs-Rails: Right;Left Entrance Stairs-Number of Steps: 3 front and 5 back   Home Layout: One level Home Equipment: Agricultural [employer]ant (2 wheels);Rollator (4 wheels) Additional Comments: Information provided by pt's son.    Prior Function Prior Level of Function : Needs assist       Physical Assist : Mobility (physical) Mobility (physical): Transfers;Gait   Mobility Comments: Son reports pt uses RW at times in the home but not always. Household ambulator only. ADLs Comments: Son reports the pt is not assited but that the pt is struggling to complete her ADL's.     Extremity/Trunk Assessment   Upper Extremity Assessment Upper Extremity Assessment: Defer to OT evaluation    Lower Extremity Assessment Lower Extremity Assessment: Generalized weakness    Cervical / Trunk Assessment Cervical / Trunk Assessment: Kyphotic  Communication   Communication Communication: Impaired Factors Affecting Communication: Reduced clarity of speech;Hearing impaired    Cognition Arousal: Lethargic Behavior During Therapy: Flat affect                             Following commands: Impaired Following commands impaired: Follows one step commands inconsistently, Follows one step commands with increased time     Cueing Cueing Techniques: Verbal cues, Tactile cues, Visual cues     General Comments      Exercises     Assessment/Plan    PT Assessment Patient needs continued PT services  PT Problem List Decreased strength;Decreased activity tolerance;Decreased balance;Decreased mobility       PT Treatment Interventions DME instruction;Gait training;Stair training;Functional mobility training;Therapeutic activities;Therapeutic exercise;Balance training;Patient/family education    PT Goals (Current goals can be found in the Care Plan section)  Acute Rehab PT Goals Patient  Stated Goal: return home PT Goal Formulation: With patient/family Time For Goal Achievement: 05/31/24 Potential to Achieve Goals: Good    Frequency Min 3X/week     Co-evaluation PT/OT/SLP Co-Evaluation/Treatment: Yes Reason for Co-Treatment: To address functional/ADL transfers PT goals addressed during session: Mobility/safety with mobility;Balance;Proper use of DME OT goals addressed during session: ADL's and self-care       AM-PAC PT 6 Clicks Mobility  Outcome Measure Help needed turning from your back to your side while in a flat bed without using bedrails?: A Lot Help needed moving from lying on your back to sitting on the side of a flat bed without using bedrails?: A Lot Help needed moving to and from a bed to a chair (including a wheelchair)?: A Lot Help needed standing up from a chair using your arms (e.g., wheelchair or bedside chair)?: A Lot Help needed to walk in hospital room?: A Lot Help needed climbing 3-5 steps with a railing? : Total 6 Click Score: 11    End of Session   Activity Tolerance: Patient tolerated treatment well;Patient limited by fatigue;Patient limited by lethargy Patient left: in chair;with call bell/phone within reach;with nursing/sitter in room;with family/visitor present Nurse Communication: Mobility status PT Visit Diagnosis: Unsteadiness on feet (R26.81);Other abnormalities of gait and mobility (R26.89);Muscle weakness (generalized) (M62.81)    Time: 8970-8951 PT Time Calculation (min) (ACUTE ONLY): 19 min   Charges:   PT Evaluation $PT Eval Low Complexity: 1 Low PT Treatments $Therapeutic Activity: 8-22 mins PT General Charges $$  ACUTE PT VISIT: 1 Visit         2:26 PM, 05/17/24 Lynwood Music, MPT Physical Therapist with [facility] [phone] office [phone] mobile phone

## 2024-05-17 NOTE — TOC Initial Note (Signed)
 Transition of Care [facility]) - Initial/Assessment Note    Patient Details  Name: [name] MRN: [id number] Date of Birth: 1933/01/14  [facility] CM/SW Contact:    Lucie Lunger, LCSWA Phone Number: 05/17/2024, 2:28 PM  Clinical Narrative:                 CSW spoke with pts granddaughter to complete assessment. Pt grandson lives with her. Pt is independent in completing her ADLs. Pts granddaughter arranges meals and meds for the week. Pts granddaughter provides transportation when needed. Pt has not had HH. Pt has walkers in the home to use if needed. CSW spoke with granddaughter about SNF being recommended at this time, she is agreeable to referral and prefers [facility] if possible. CSW to send out for review. TOC to follow.   Expected Discharge Plan: Skilled Nursing Facility Barriers to Discharge: Continued Medical Work up   Patient Goals and CMS Choice Patient states their goals for this hospitalization and ongoing recovery are:: get stronger CMS Medicare.gov Compare Post Acute Care list provided to:: Patient Represenative (must comment) Choice offered to / list presented to : Adult Children      Expected Discharge Plan and Services In-house Referral: Clinical Social Work Discharge Planning Services: CM Consult Post Acute Care Choice: Skilled Nursing Facility Living arrangements for the past 2 months: Single Family Home                                      Prior Living Arrangements/Services Living arrangements for the past 2 months: Single Family Home Lives with:: Relatives Patient language and need for interpreter reviewed:: Yes Do you feel safe going back to the place where you live?: Yes      Need for Family Participation in Patient Care: Yes (Comment) Care giver support system in place?: Yes (comment) Current home services: DME Criminal Activity/Legal Involvement Pertinent to Current Situation/Hospitalization: No - Comment as needed  Activities of  Daily Living   ADL Screening (condition at time of admission) Independently performs ADLs?: No Does the patient have a NEW difficulty with bathing/dressing/toileting/self-feeding that is expected to last >3 days?: Yes (Initiates electronic notice to provider for possible OT consult) Does the patient have a NEW difficulty with getting in/out of bed, walking, or climbing stairs that is expected to last >3 days?: Yes (Initiates electronic notice to provider for possible PT consult) Does the patient have a NEW difficulty with communication that is expected to last >3 days?: No Is the patient deaf or have difficulty hearing?: No Does the patient have difficulty seeing, even when wearing glasses/contacts?: No Does the patient have difficulty concentrating, remembering, or making decisions?: Yes  Permission Sought/Granted                  Emotional Assessment Appearance:: Appears stated age Attitude/Demeanor/Rapport: Engaged Affect (typically observed): Accepting   Alcohol / Substance Use: Not Applicable Psych Involvement: No (comment)  Admission diagnosis:  Fall [W19.XXXA] Patient Active Problem List   Diagnosis Date Noted   C2 cervical fracture (HCC) 05/17/2024   Leukocytosis 05/17/2024   Acute metabolic encephalopathy 05/17/2024   Generalized weakness 05/17/2024   Failure to thrive in adult 05/17/2024   Fall 05/16/2024   Burning with urination 10/04/2016   Back pain 01/30/2015   Urinary urgency 12/25/2014   Hematuria 12/25/2014   UTI (urinary tract infection) 11/02/2013   Muscle weakness (generalized) 10/27/2010  Cervicalgia 10/27/2010   Pain in joint, shoulder region 10/27/2010   PCP:  Sheryle Carwin, MD Pharmacy:   [facility] - [place], [place] - [address] [place] [address] Phone: [phone] Fax: [phone]     Social Drivers of Health (SDOH) Social History: SDOH Screenings   Food Insecurity: Patient Unable To Answer  (05/16/2024)  Housing: Patient Unable To Answer (05/16/2024)  Transportation Needs: Patient Unable To Answer (05/16/2024)  Utilities: Patient Unable To Answer (05/16/2024)  Social Connections: Patient Unable To Answer (05/17/2024)  Tobacco Use: Low Risk (05/16/2024)   SDOH Interventions:     Readmission Risk Interventions    05/17/2024    1:47 PM  Readmission Risk Prevention Plan  Medication Screening Complete  Transportation Screening Complete

## 2024-05-17 NOTE — Progress Notes (Signed)
 " PROGRESS NOTE   [name]  FMW:[id number] DOB: Dec 01, 1932 DOA: 05/16/2024 PCP: Sheryle Carwin, MD   Chief Complaint  Patient presents with   Fall   Level of care: Telemetry  Brief Admission History:  89 y.o. female with medical history significant for hypertension, type 2 diabetes, osteoarthritis, who presents to the ER due to an unwitnessed fall at home and altered mental status.  The patient lives at home with her grandson.  Reportedly, she tripped and fell.  She may have been opening the door to her room and it got caught up on her clothes.  Unclear if she hit her head on the door frame.  She was found on the floor.  No loss of consciousness reported.  This fall was associated with 2 episodes of vomiting prior to coming to the ER.   In the ER, alert, she is confused according to her son.  Noncontrast Head CT showed left frontal scalp hematoma and periorbital soft tissue edema.  No acute intracranial abnormality.  CT cervical spine without contrast showed acute nondisplaced type III fracture of the C2 vertebral body with possible extension to the left C2 transverse foramen.  Severe atherosclerotic plaque of the aortic arch and its branches including the carotid arteries within the neck.   EDP discussed the case with neurosurgery recommended hard collar.  Nonoperative.  Labs today notable for leukocytosis 17,000.  UA positive for pyuria.   Admitted by [facility], hospitalist service, for pain control and UTI.   Assessment and Plan:  Acute nondisplaced type III fracture of the C2 vertebral body with possible extension to the left C2 transverse foramen post unwitnessed fall CT cervical spine without contrast showed acute nondisplaced type III fracture of the C2 vertebral body with possible extension to the left C2 transverse foramen.  Severe atherosclerotic plaque of the aortic arch and its branches including the carotid arteries within the neck. ED discussed with PA Meyran covering Dr. Onetha.  Pt  to wear hard C collar at all times, does not need angio study, follow up with neurosurgery outpatient.  Pain control Hard collar ordered to be placed Asap.  PT/OT evaluation Fall precautions.   UTI UA positive for pyuria Continue ceftriaxone  IV x 3 doses  Follow urine culture   Leukocytosis suspect multifactorial secondary to the fracture and UTI Presented with WBC of 17.4 Continue to treat underlying conditions. Repeat CBC in the morning   Acute metabolic encephalopathy secondary to the above Continue to treat underlying conditions Per her son at bedside, no diagnosis of dementia, however, has had some forgetfulness Aspiration and fall precaution.    DVT prophylaxis: SCDs Code Status: Full  Family Communication:  Disposition: TBD    Consultants:  Neurosurgery (telephone) Procedures:   Antimicrobials:    Subjective: Pt resting after pain medication administration  Objective: Vitals:   05/17/24 0307 05/17/24 0340 05/17/24 0841 05/17/24 0956  BP: (!) 144/59 131/68 (!) 151/59 (!) 139/48  Pulse:  83 88 64  Resp: 20 20  20   Temp:  98.3 F (36.8 C) 98.2 F (36.8 C) 98.2 F (36.8 C)  TempSrc:  Oral Oral Oral  SpO2:  98% 99% 100%    Intake/Output Summary (Last 24 hours) at 05/17/2024 1221 Last data filed at 05/17/2024 0401 Gross per 24 hour  Intake 0 ml  Output --  Net 0 ml   There were no vitals filed for this visit. Examination:  General exam: Appears calm and comfortable  Respiratory system: Clear to auscultation. Respiratory  effort normal. Cardiovascular system: normal S1 & S2 heard. No JVD, murmurs, rubs, gallops or clicks. No pedal edema. Gastrointestinal system: Abdomen is nondistended, soft and nontender. No organomegaly or masses felt. Normal bowel sounds heard. Central nervous system: Alert and oriented. No focal neurological deficits. Extremities: Symmetric 5 x 5 power. Skin: No rashes, lesions or ulcers. Psychiatry: Judgement and insight appear  normal. Mood & affect appropriate.   Data Reviewed: I have personally reviewed following labs and imaging studies  CBC: Recent Labs  Lab 05/16/24 1811  WBC 17.4*  NEUTROABS 15.5*  HGB 13.4  HCT 42.4  MCV 93.4  PLT 249    Basic Metabolic Panel: Recent Labs  Lab 05/16/24 1811  NA 142  K 4.2  CL 103  CO2 25  GLUCOSE 87  BUN 26*  CREATININE 1.14*  CALCIUM 9.6    CBG: Recent Labs  Lab 05/17/24 0952  GLUCAP 104*    Recent Results (from the past 240 hours)  Culture, blood (Routine X 2) w Reflex to ID Panel     Status: None (Preliminary result)   Collection Time: 05/17/24 12:14 AM   Specimen: BLOOD  Result Value Ref Range Status   Specimen Description BLOOD BLOOD RIGHT WRIST  Final   Special Requests   Final    BOTTLES DRAWN AEROBIC ONLY Blood Culture adequate volume   Culture   Final    NO GROWTH < 12 HOURS Performed at [facility], [address], [place], [place] [address]    Report Status PENDING  Incomplete  Culture, blood (Routine X 2) w Reflex to ID Panel     Status: None (Preliminary result)   Collection Time: 05/17/24 12:26 AM   Specimen: BLOOD  Result Value Ref Range Status   Specimen Description BLOOD BLOOD LEFT ARM  Final   Special Requests   Final    BOTTLES DRAWN AEROBIC ONLY Blood Culture adequate volume   Culture   Final    NO GROWTH < 12 HOURS Performed at [facility], [address], [place], [place] [address]    Report Status PENDING  Incomplete     Radiology Studies: CT CHEST ABDOMEN PELVIS WO CONTRAST Result Date: 05/16/2024 CLINICAL DATA:  Trauma.  Fall.  Patient found on the floor. EXAM: CT CHEST, ABDOMEN AND PELVIS WITHOUT CONTRAST TECHNIQUE: Multidetector CT imaging of the chest, abdomen and pelvis was performed following the standard protocol without IV contrast. RADIATION DOSE REDUCTION: This exam was performed according to the departmental dose-optimization program which includes automated exposure control, adjustment of the mA  and/or kV according to patient size and/or use of iterative reconstruction technique. COMPARISON:  CT of the abdomen dated 02/06/2015. FINDINGS: Evaluation of this exam is limited in the absence of intravenous contrast. CT CHEST FINDINGS Cardiovascular: There is no cardiomegaly or pericardial effusion. Advanced 3 vessel coronary vascular calcification. Coronary stent noted in the LAD. There is calcification of the mitral annulus. Moderate atherosclerotic calcification of the thoracic aorta. No aneurysmal dilatation. The central pulmonary arteries are grossly unremarkable. Mediastinum/Nodes: No hilar or mediastinal adenopathy. The esophagus is grossly unremarkable. No mediastinal fluid collection. Lungs/Pleura: No focal consolidation, pleural effusion, pneumothorax. The central airways are patent. Musculoskeletal: Osteopenia with degenerative changes of the spine. No acute osseous pathology. CT ABDOMEN PELVIS FINDINGS No intra-abdominal free air or free fluid. Hepatobiliary: The liver is unremarkable. No biliary ductal dilatation. Layering stones in the gallbladder. No pericholecystic fluid or evidence of acute cholecystitis by CT. Pancreas: The pancreas is atrophic. No active inflammatory  changes. No dilatation of the main pancreatic duct Spleen: Small splenic calcification. Adrenals/Urinary Tract: The adrenal glands are unremarkable. Small nonobstructing right renal calculi measure up to 3-4 mm. No hydronephrosis. The visualized ureters and urinary bladder appear unremarkable. Stomach/Bowel: Moderate stool throughout the colon. There is no bowel obstruction or active inflammation. The appendix is normal. Vascular/Lymphatic: Advanced aortoiliac atherosclerotic disease. The IVC is unremarkable. No portal venous gas. There is no adenopathy. Reproductive: Hysterectomy.  A 2.8 cm left pelvic cyst. Other: None Musculoskeletal: Osteopenia with degenerative changes of spine and grade 1 L5-S1 anterolisthesis. No acute  osseous pathology. IMPRESSION: 1. No acute/traumatic intrathoracic, abdominal, or pelvic pathology. 2. Cholelithiasis. 3. Small nonobstructing right renal calculi. No hydronephrosis. 4.  Aortic Atherosclerosis (ICD10-I70.0). Electronically Signed   By: Vanetta Chou M.D.   On: 05/16/2024 20:12   CT Maxillofacial WO CM Result Date: 05/16/2024 EXAM: CT OF THE FACE WITHOUT CONTRAST 05/16/2024 05:51:04 PM TECHNIQUE: CT of the face was performed without the administration of intravenous contrast. Multiplanar reformatted images are provided for review. Automated exposure control, iterative reconstruction, and/or weight based adjustment of the mA/kV was utilized to reduce the radiation dose to as low as reasonably achievable. COMPARISON: None available. CLINICAL HISTORY: Facial trauma, blunt. FINDINGS: FACIAL BONES: Patient is edentulous. No acute facial fracture. No mandibular dislocation. No suspicious bone lesion. ORBITS: Globes are intact. No acute traumatic injury. No inflammatory change. SINUSES AND MASTOIDS: No acute abnormality. SOFT TISSUES: No acute abnormality. IMPRESSION: 1. No acute facial fracture. Electronically signed by: Morgane Naveau MD 05/16/2024 06:27 PM EST RP Workstation: [id number]   CT Cervical Spine Wo Contrast Result Date: 05/16/2024 EXAM: CT CERVICAL SPINE WITHOUT CONTRAST 05/16/2024 05:51:04 PM TECHNIQUE: CT of the cervical spine was performed without the administration of intravenous contrast. Multiplanar reformatted images are provided for review. Automated exposure control, iterative reconstruction, and/or weight based adjustment of the mA/kV was utilized to reduce the radiation dose to as low as reasonably achievable. COMPARISON: None available. CLINICAL HISTORY: Neck trauma (Age >= 65y). Neck trauma. Age >= 65 years. FINDINGS: BONES AND ALIGNMENT: Vague lucency and cortical break of the C2 vertebral body along the odontoid into the lateral masses of C2 suggestive of acute  nondisplaced type 3 fracture (6:45 ; 4:33 - 34). Possible extension to the C2 left transverse foramina (8:24). Grade 1 anterolisthesis of C4 on C5. Diffusely decreased bone density. DEGENERATIVE CHANGES: Multilevel moderate degenerative changes of the spine. SOFT TISSUES: No prevertebral soft tissue swelling. Severe atherosclerotic plaque of the aortic arch and its branches including the carotid arteries within the neck. IMPRESSION: 1. Acute nondisplaced type 3 fracture of the C2 vertebral body with possible extension to the left C2 transverse foramen. 2. Severe atherosclerotic plaque of the aortic arch and its branches including the carotid arteries within the neck. Electronically signed by: Morgane Naveau MD 05/16/2024 06:21 PM EST RP Workstation: [id number]   CT Head Wo Contrast Result Date: 05/16/2024 EXAM: CT HEAD WITHOUT CONTRAST 05/16/2024 05:51:04 PM TECHNIQUE: CT of the head was performed without the administration of intravenous contrast. Automated exposure control, iterative reconstruction, and/or weight based adjustment of the mA/kV was utilized to reduce the radiation dose to as low as reasonably achievable. COMPARISON: None available. CLINICAL HISTORY: Head trauma, minor (Age >= 65 years). FINDINGS: BRAIN AND VENTRICLES: No acute hemorrhage. No evidence of acute infarct. No hydrocephalus. No extra-axial collection. No mass effect or midline shift. Age-related atrophy. Moderate periventricular and deep white matter hypodensity consistent with chronic small vessel ischemia. ORBITS: Bilateral lens  resection. No definite intraconal fat stranding bilaterally. SINUSES: Mild mucosal thickening in ethmoid air cells. SOFT TISSUES AND SKULL: Left frontal scalp hematoma and periorbital soft tissue edema. Atherosclerosis of skullbase vasculature. No skull fracture. IMPRESSION: 1. Left frontal scalp hematoma and periorbital soft tissue edema. 2. No acute intracranial abnormality. Electronically signed by:  Morgane Naveau MD 05/16/2024 06:02 PM EST RP Workstation: [id number]   DG Knee Complete 4 Views Left Result Date: 05/16/2024 EXAM: 4 OR MORE VIEW(S) XRAY OF THE LEFT KNEE 05/16/2024 05:32:00 PM COMPARISON: None available. CLINICAL HISTORY: Fall pain. FINDINGS: BONES AND JOINTS: No acute fracture. No malalignment. Diffuse osteopenia. Mild to moderate patellofemoral joint space loss. Mild medial compartment joint space loss. Moderate to severe lateral compartment joint space loss. Osteophyte formation noted. Trace joint effusion. SOFT TISSUES: Peripheral vascular atherosclerosis. Unremarkable. IMPRESSION: 1. Trace knee joint effusion. No acute fracture or dislocation. 2. Tricompartmental osteoarthritis, worst within the lateral compartment, as described above. Electronically signed by: Rogelia Myers MD 05/16/2024 05:51 PM EST RP Workstation: [id number]   DG Forearm Left Result Date: 05/16/2024 EXAM: 2 VIEW(S) XRAY OF THE LEFT FOREARM 05/16/2024 05:32:00 PM COMPARISON: None available. CLINICAL HISTORY: Fall pain. FINDINGS: BONES AND JOINTS: Diffuse osteopenia. No acute fracture. No malalignment. SOFT TISSUES: Peripheral vascular atherosclerosis. IMPRESSION: 1. No acute fracture or dislocation. Electronically signed by: Rogelia Myers MD 05/16/2024 05:48 PM EST RP Workstation: [id number]    Scheduled Meds:  acetaminophen   650 mg Oral Q6H   polyethylene glycol  17 g Oral Daily   Continuous Infusions:  cefTRIAXone  (ROCEPHIN )  IV       LOS: 1 day   Time spent: 55 mins  Latorie Montesano Vicci, MD How to contact the [facility] Attending or Consulting provider 7A - 7P or covering provider during after hours 7P -7A, for this patient?  Check the care team in [facility] and look for a) attending/consulting [facility] provider listed and b) the [facility] team listed Log into [email or web address] to find provider on call.  Locate the [facility] provider you are looking for under [facility] and page to a number that you can be directly  reached. If you still have difficulty reaching the provider, please page the [facility] (Director on Call) for the Hospitalists listed on amion for assistance.  05/17/2024, 12:21 PM    "

## 2024-05-18 DIAGNOSIS — R627 Adult failure to thrive: Secondary | ICD-10-CM | POA: Diagnosis not present

## 2024-05-18 DIAGNOSIS — S12101D Unspecified nondisplaced fracture of second cervical vertebra, subsequent encounter for fracture with routine healing: Secondary | ICD-10-CM | POA: Diagnosis not present

## 2024-05-18 DIAGNOSIS — G9341 Metabolic encephalopathy: Secondary | ICD-10-CM | POA: Diagnosis not present

## 2024-05-18 DIAGNOSIS — R531 Weakness: Secondary | ICD-10-CM | POA: Diagnosis not present

## 2024-05-18 LAB — CBC WITH DIFFERENTIAL/PLATELET
Abs Immature Granulocytes: 0.04 10*3/uL (ref 0.00–0.07)
Basophils Absolute: 0.1 10*3/uL (ref 0.0–0.1)
Basophils Relative: 1 %
Eosinophils Absolute: 0.1 10*3/uL (ref 0.0–0.5)
Eosinophils Relative: 1 %
HCT: 35.9 % — ABNORMAL LOW (ref 36.0–46.0)
Hemoglobin: 11.3 g/dL — ABNORMAL LOW (ref 12.0–15.0)
Immature Granulocytes: 1 %
Lymphocytes Relative: 19 %
Lymphs Abs: 1.6 10*3/uL (ref 0.7–4.0)
MCH: 29.2 pg (ref 26.0–34.0)
MCHC: 31.5 g/dL (ref 30.0–36.0)
MCV: 92.8 fL (ref 80.0–100.0)
Monocytes Absolute: 0.9 10*3/uL (ref 0.1–1.0)
Monocytes Relative: 11 %
Neutro Abs: 5.8 10*3/uL (ref 1.7–7.7)
Neutrophils Relative %: 67 %
Platelets: 216 10*3/uL (ref 150–400)
RBC: 3.87 MIL/uL (ref 3.87–5.11)
RDW: 13.6 % (ref 11.5–15.5)
WBC: 8.5 10*3/uL (ref 4.0–10.5)
nRBC: 0 % (ref 0.0–0.2)

## 2024-05-18 LAB — GLUCOSE, CAPILLARY
Glucose-Capillary: 174 mg/dL — ABNORMAL HIGH (ref 70–99)
Glucose-Capillary: 203 mg/dL — ABNORMAL HIGH (ref 70–99)

## 2024-05-18 MED ORDER — CEFADROXIL 500 MG PO CAPS: 500.0000 mg | ORAL_CAPSULE | Freq: Two times a day (BID) | ORAL | 0 refills | Status: DC

## 2024-05-18 MED ORDER — CEFADROXIL 500 MG PO CAPS: 500.0000 mg | ORAL_CAPSULE | Freq: Two times a day (BID) | ORAL | Status: AC

## 2024-05-18 MED ORDER — OXYCODONE HCL 5 MG PO TABS: 5.0000 mg | ORAL_TABLET | Freq: Four times a day (QID) | ORAL | 0 refills | Status: AC | PRN

## 2024-05-18 MED ORDER — INSULIN GLARGINE 100 UNIT/ML ~~LOC~~ SOLN: 30.0000 [IU] | Freq: Every day | SUBCUTANEOUS | Status: AC

## 2024-05-18 MED ORDER — POLYETHYLENE GLYCOL 3350 17 G PO PACK: 17.0000 g | PACK | Freq: Every day | ORAL | Status: AC

## 2024-05-18 MED ORDER — ACETAMINOPHEN 325 MG PO TABS: 650.0000 mg | ORAL_TABLET | Freq: Four times a day (QID) | ORAL | Status: AC

## 2024-05-18 MED ORDER — MELATONIN 3 MG PO TABS: 6.0000 mg | ORAL_TABLET | Freq: Every evening | ORAL | Status: AC | PRN

## 2024-05-18 MED ORDER — LINAGLIPTIN 5 MG PO TABS: 5.0000 mg | ORAL_TABLET | Freq: Every day | ORAL | Status: DC

## 2024-05-18 MED ORDER — METFORMIN HCL ER 500 MG PO TB24: 500.0000 mg | ORAL_TABLET | Freq: Every day | ORAL | Status: DC

## 2024-05-18 MED ORDER — LOSARTAN POTASSIUM 100 MG PO TABS: 100.0000 mg | ORAL_TABLET | Freq: Every day | ORAL | Status: AC

## 2024-05-18 NOTE — Discharge Instructions (Signed)
 PATIENT SHOULD WEAR HARD C COLLAR AT ALL TIMES UNTIL FOLLOW UP WITH NEUROSURGERY    IMPORTANT INFORMATION: PAY CLOSE ATTENTION   PHYSICIAN DISCHARGE INSTRUCTIONS  Follow with Primary care provider  Kimberly Carwin, MD  and other consultants as instructed by your Hospitalist Physician  SEEK MEDICAL CARE OR RETURN TO EMERGENCY ROOM IF SYMPTOMS COME BACK, WORSEN OR NEW PROBLEM DEVELOPS   Please note: You were cared for by a hospitalist during your hospital stay. Every effort will be made to forward records to your primary care provider.  You can request that your primary care provider send for your hospital records if they have not received them.  Once you are discharged, your primary care physician will handle any further medical issues. Please note that NO REFILLS for any discharge medications will be authorized once you are discharged, as it is imperative that you return to your primary care physician (or establish a relationship with a primary care physician if you do not have one) for your post hospital discharge needs so that they can reassess your need for medications and monitor your lab values.  Please get a complete blood count and chemistry panel checked by your Primary MD at your next visit, and again as instructed by your Primary MD.  Get Medicines reviewed and adjusted: Please take all your medications with you for your next visit with your Primary MD  Laboratory/radiological data: Please request your Primary MD to go over all hospital tests and procedure/radiological results at the follow up, please ask your primary care provider to get all Hospital records sent to his/her office.  In some cases, they will be blood work, cultures and biopsy results pending at the time of your discharge. Please request that your primary care provider follow up on these results.  If you are diabetic, please bring your blood sugar readings with you to your follow up appointment with primary care.     Please call and make your follow up appointments as soon as possible.    Also Note the following: If you experience worsening of your admission symptoms, develop shortness of breath, life threatening emergency, suicidal or homicidal thoughts you must seek medical attention immediately by calling 911 or calling your MD immediately  if symptoms less severe.  You must read complete instructions/literature along with all the possible adverse reactions/side effects for all the Medicines you take and that have been prescribed to you. Take any new Medicines after you have completely understood and accpet all the possible adverse reactions/side effects.   Do not drive when taking Pain medications or sleeping medications (Benzodiazepines)  Do not take more than prescribed Pain, Sleep and Anxiety Medications. It is not advisable to combine anxiety,sleep and pain medications without talking with your primary care practitioner  Special Instructions: If you have smoked or chewed Tobacco  in the last 2 yrs please stop smoking, stop any regular Alcohol  and or any Recreational drug use.  Wear Seat belts while driving.  Do not drive if taking any narcotic, mind altering or controlled substances or recreational drugs or alcohol.

## 2024-05-18 NOTE — Plan of Care (Signed)

## 2024-05-18 NOTE — TOC Transition Note (Signed)
 Transition of Care [facility]) - Discharge Note   Patient Details  Name: [name] MRN: [id number] Date of Birth: 10-06-1932  [facility] CM/SW Contact:  Lucie Lunger, LCSWA Phone Number: 05/18/2024, 1:00 PM  Clinical Narrative:    CSW updated that insurance shara has been approved for SNF at [facility]. CSW spoke to [place] in admissions who confirms pt can arrive to facility today. CSW sent D/C clinicals to facility via HUB. Room and report provided to RN. CSW spoke with pts granddaughter to provide update on D/C. TOC signing off.   Final next level of care: Skilled Nursing Facility Barriers to Discharge: Barriers Resolved   Patient Goals and CMS Choice Patient states their goals for this hospitalization and ongoing recovery are:: go to SNF CMS Medicare.gov Compare Post Acute Care list provided to:: Patient Represenative (must comment) Choice offered to / list presented to : Adult Children [facility] ownership interest in [facility].provided to:: Adult Children    Discharge Placement              Patient chooses bed at: [facility] Patient to be transferred to facility by: Facility staff Name of family member notified: Granddaughter Arland Patient and family notified of of transfer: 05/18/24  Discharge Plan and Services Additional resources added to the After Visit Summary for   In-house Referral: Clinical Social Work Discharge Planning Services: CM Consult Post Acute Care Choice: Skilled Nursing Facility                               Social Drivers of Health (SDOH) Interventions SDOH Screenings   Food Insecurity: Patient Unable To Answer (05/16/2024)  Housing: Patient Unable To Answer (05/16/2024)  Transportation Needs: Patient Unable To Answer (05/16/2024)  Utilities: Patient Unable To Answer (05/16/2024)  Social Connections: Patient Unable To Answer (05/17/2024)  Tobacco Use: Low Risk (05/16/2024)     Readmission Risk  Interventions    05/17/2024    1:47 PM  Readmission Risk Prevention Plan  Medication Screening Complete  Transportation Screening Complete

## 2024-05-18 NOTE — Progress Notes (Signed)
 PT Cancellation Note  Patient Details Name: [name] MRN: [id number] DOB: September 18, 1932   Cancelled Treatment:    Reason Eval/Treat Not Completed: Pain limiting ability to participate Held PT session due to increased pain per RN.  Pt left in bed with call bell within reach.  Augustin Mclean, LPTA/CLT; CBIS [phone]   [name] 05/18/2024, 11:47 AM

## 2024-05-18 NOTE — Discharge Summary (Addendum)
 Physician Discharge Summary  [name] FMW:[id number] DOB: 1932/07/08 DOA: 05/16/2024  PCP: Sheryle Carwin, MD  Admit date: 05/16/2024 Discharge date: 05/18/2024  Disposition:  SNF  Recommendations for Outpatient Follow-up:  Follow up with PCP in 2 weeks Follow up with neurosurgery in 2 weeks  Please obtain BMP/CBC in 1 week Please check blood sugars 5 times per day and adjust therapy as needed for better glycemic control Per neurosurgery: Pt to wear hard C-collar at all times until follow up with neurosurgery  Follow up final urine culture results  Home Health: SNF  Discharge Condition: STABLE   CODE STATUS: FULL DIET:  Dysphagia 3, aspiration precautions advised   Brief Hospitalization Summary: Please see all hospital notes, images, labs for full details of the hospitalization. Admission provider HPI: 89 y.o. female with medical history significant for hypertension, type 2 diabetes, osteoarthritis, who presents to the ER due to an unwitnessed fall at home and altered mental status.  The patient lives at home with her grandson.  Reportedly, she tripped and fell.  She may have been opening the door to her room and it got caught up on her clothes.  Unclear if she hit her head on the door frame.  She was found on the floor.  No loss of consciousness reported.  This fall was associated with 2 episodes of vomiting prior to coming to the ER.   In the ER, alert, she is confused according to her son.  Noncontrast Head CT showed left frontal scalp hematoma and periorbital soft tissue edema.  No acute intracranial abnormality.  CT cervical spine without contrast showed acute nondisplaced type III fracture of the C2 vertebral body with possible extension to the left C2 transverse foramen.  Severe atherosclerotic plaque of the aortic arch and its branches including the carotid arteries within the neck.   EDP discussed the case with neurosurgery recommended hard collar.  Nonoperative.  Labs today  notable for leukocytosis 17,000.  UA positive for pyuria.   Admitted by [facility], hospitalist service, for pain control and UTI.  Hospital course by listed problems addressed   Acute nondisplaced type III fracture of the C2 vertebral body with possible extension to the left C2 transverse foramen post unwitnessed fall CT cervical spine without contrast showed acute nondisplaced type III fracture of the C2 vertebral body with possible extension to the left C2 transverse foramen.  Severe atherosclerotic plaque of the aortic arch and its branches including the carotid arteries within the neck. ED discussed with PA Meyran covering Dr. Onetha.  Pt to wear hard C collar at all times, does not need angio study, follow up with neurosurgery outpatient.  Pain control Hard collar ordered to be placed Asap.  PT/OT evaluation--SNF recommended  Fall precautions recommended. Ambulatory referral to neurosurgery for outpatient follow up    UTI UA positive for pyuria Continue ceftriaxone  IV x 2 doses followed by duricef x 3 d Follow urine culture  Hypertension -- resume home meds:  losartan  100 mg daily, H.CTZ 25 mg daily, I called [facility] to confirm  Type 2 DM  -- resume home lantus  30 units daily  -- I called granddaughter to confirm dose on 05/18/24  Hyperlipidemia -- resume home rosuvastatin 20 mg daily -- I called and confirmed dose with [facility] on 05/18/24    Leukocytosis suspect multifactorial secondary to the fracture and UTI --RESOLVED  Presented with WBC of 17.4 Continue to treat underlying conditions. Repeat CBC with WBC 8.5    Acute metabolic  encephalopathy secondary to the above---IMPROVED Continue to treat underlying conditions Per her son at bedside, no diagnosis of dementia, however, has had some forgetfulness Aspiration and fall precautions recommended.   Discharge Diagnoses:  Principal Problem:   Fall Active Problems:   UTI (urinary tract infection)   C2  cervical fracture (HCC)   Leukocytosis   Acute metabolic encephalopathy   Generalized weakness   Failure to thrive in adult   Discharge Instructions: Discharge Instructions     Ambulatory referral to Neurosurgery   Complete by: As directed    Hospital follow up C2 fracture      Allergies as of 05/18/2024       Reactions   Iodine Other (See Comments)   Pt granddaughter reports pt is allergic to iodine, unsure of reaction        Medication List     STOP taking these medications    Accu-Chek Aviva Plus test strip Generic drug: glucose blood   cephALEXin  500 MG capsule Commonly known as: KEFLEX    gabapentin 100 MG capsule Commonly known as: NEURONTIN   losartan -hydrochlorothiazide 100-25 MG tablet Commonly known as: HYZAAR   metFORMIN  500 MG 24 hr tablet Commonly known as: GLUCOPHAGE -XR   metFORMIN  500 MG tablet Commonly known as: GLUCOPHAGE    olmesartan 20 MG tablet Commonly known as: BENICAR   SURE COMFORT INS SYR .5CC/30G 30G X 5/16 0.5 ML Misc Generic drug: Insulin  Syringe-Needle U-100       TAKE these medications    acetaminophen  325 MG tablet Commonly known as: TYLENOL  Take 2 tablets (650 mg total) by mouth every 6 (six) hours. What changed:  medication strength how much to take when to take this reasons to take this additional instructions   cefadroxil  500 MG capsule Commonly known as: DURICEF Take 1 capsule (500 mg total) by mouth 2 (two) times daily for 3 days.   hydrochlorothiazide 25 MG tablet Commonly known as: HYDRODIURIL Take 25 mg by mouth daily.   insulin  glargine 100 UNIT/ML injection Commonly known as: LANTUS  Inject 0.3 mLs (30 Units total) into the skin daily. What changed:  how much to take Another medication with the same name was removed. Continue taking this medication, and follow the directions you see here.   losartan  100 MG tablet Commonly known as: COZAAR  Take 1 tablet (100 mg total) by mouth daily.    melatonin 3 MG Tabs tablet Take 2 tablets (6 mg total) by mouth at bedtime as needed.   oxyCODONE  5 MG immediate release tablet Commonly known as: Oxy IR/ROXICODONE  Take 1 tablet (5 mg total) by mouth every 6 (six) hours as needed for breakthrough pain or severe pain (pain score 7-10).   polyethylene glycol 17 g packet Commonly known as: MIRALAX  / GLYCOLAX  Take 17 g by mouth daily. Start taking on: May 19, 2024   rosuvastatin 20 MG tablet Commonly known as: CRESTOR Take by mouth.        Contact information for follow-up providers     Onetha Kuba, MD. Schedule an appointment as soon as possible for a visit in 2 week(s).   Specialty: Neurosurgery Why: Hospital Follow Up Contact information: [address] [place] [address] [phone]              Contact information for after-discharge care     Destination     [facility] .   Service: Skilled Nursing Contact information: [address] [place]   [phone]  Allergies[1] Allergies as of 05/18/2024       Reactions   Iodine Other (See Comments)   Pt granddaughter reports pt is allergic to iodine, unsure of reaction        Medication List     STOP taking these medications    Accu-Chek Aviva Plus test strip Generic drug: glucose blood   cephALEXin  500 MG capsule Commonly known as: KEFLEX    gabapentin 100 MG capsule Commonly known as: NEURONTIN   losartan -hydrochlorothiazide 100-25 MG tablet Commonly known as: HYZAAR   metFORMIN  500 MG 24 hr tablet Commonly known as: GLUCOPHAGE -XR   metFORMIN  500 MG tablet Commonly known as: GLUCOPHAGE    olmesartan 20 MG tablet Commonly known as: BENICAR   SURE COMFORT INS SYR .5CC/30G 30G X 5/16 0.5 ML Misc Generic drug: Insulin  Syringe-Needle U-100       TAKE these medications    acetaminophen  325 MG tablet Commonly known as: TYLENOL  Take 2 tablets (650  mg total) by mouth every 6 (six) hours. What changed:  medication strength how much to take when to take this reasons to take this additional instructions   cefadroxil  500 MG capsule Commonly known as: DURICEF Take 1 capsule (500 mg total) by mouth 2 (two) times daily for 3 days.   hydrochlorothiazide 25 MG tablet Commonly known as: HYDRODIURIL Take 25 mg by mouth daily.   insulin  glargine 100 UNIT/ML injection Commonly known as: LANTUS  Inject 0.3 mLs (30 Units total) into the skin daily. What changed:  how much to take Another medication with the same name was removed. Continue taking this medication, and follow the directions you see here.   losartan  100 MG tablet Commonly known as: COZAAR  Take 1 tablet (100 mg total) by mouth daily.   melatonin 3 MG Tabs tablet Take 2 tablets (6 mg total) by mouth at bedtime as needed.   oxyCODONE  5 MG immediate release tablet Commonly known as: Oxy IR/ROXICODONE  Take 1 tablet (5 mg total) by mouth every 6 (six) hours as needed for breakthrough pain or severe pain (pain score 7-10).   polyethylene glycol 17 g packet Commonly known as: MIRALAX  / GLYCOLAX  Take 17 g by mouth daily. Start taking on: May 19, 2024   rosuvastatin 20 MG tablet Commonly known as: CRESTOR Take by mouth.        Procedures/Studies: CT CHEST ABDOMEN PELVIS WO CONTRAST Result Date: 05/16/2024 CLINICAL DATA:  Trauma.  Fall.  Patient found on the floor. EXAM: CT CHEST, ABDOMEN AND PELVIS WITHOUT CONTRAST TECHNIQUE: Multidetector CT imaging of the chest, abdomen and pelvis was performed following the standard protocol without IV contrast. RADIATION DOSE REDUCTION: This exam was performed according to the departmental dose-optimization program which includes automated exposure control, adjustment of the mA and/or kV according to patient size and/or use of iterative reconstruction technique. COMPARISON:  CT of the abdomen dated 02/06/2015. FINDINGS: Evaluation of  this exam is limited in the absence of intravenous contrast. CT CHEST FINDINGS Cardiovascular: There is no cardiomegaly or pericardial effusion. Advanced 3 vessel coronary vascular calcification. Coronary stent noted in the LAD. There is calcification of the mitral annulus. Moderate atherosclerotic calcification of the thoracic aorta. No aneurysmal dilatation. The central pulmonary arteries are grossly unremarkable. Mediastinum/Nodes: No hilar or mediastinal adenopathy. The esophagus is grossly unremarkable. No mediastinal fluid collection. Lungs/Pleura: No focal consolidation, pleural effusion, pneumothorax. The central airways are patent. Musculoskeletal: Osteopenia with degenerative changes of the spine. No acute osseous pathology. CT ABDOMEN PELVIS FINDINGS No intra-abdominal free air or free  fluid. Hepatobiliary: The liver is unremarkable. No biliary ductal dilatation. Layering stones in the gallbladder. No pericholecystic fluid or evidence of acute cholecystitis by CT. Pancreas: The pancreas is atrophic. No active inflammatory changes. No dilatation of the main pancreatic duct Spleen: Small splenic calcification. Adrenals/Urinary Tract: The adrenal glands are unremarkable. Small nonobstructing right renal calculi measure up to 3-4 mm. No hydronephrosis. The visualized ureters and urinary bladder appear unremarkable. Stomach/Bowel: Moderate stool throughout the colon. There is no bowel obstruction or active inflammation. The appendix is normal. Vascular/Lymphatic: Advanced aortoiliac atherosclerotic disease. The IVC is unremarkable. No portal venous gas. There is no adenopathy. Reproductive: Hysterectomy.  A 2.8 cm left pelvic cyst. Other: None Musculoskeletal: Osteopenia with degenerative changes of spine and grade 1 L5-S1 anterolisthesis. No acute osseous pathology. IMPRESSION: 1. No acute/traumatic intrathoracic, abdominal, or pelvic pathology. 2. Cholelithiasis. 3. Small nonobstructing right renal calculi.  No hydronephrosis. 4.  Aortic Atherosclerosis (ICD10-I70.0). Electronically Signed   By: Vanetta Chou M.D.   On: 05/16/2024 20:12   CT Maxillofacial WO CM Result Date: 05/16/2024 EXAM: CT OF THE FACE WITHOUT CONTRAST 05/16/2024 05:51:04 PM TECHNIQUE: CT of the face was performed without the administration of intravenous contrast. Multiplanar reformatted images are provided for review. Automated exposure control, iterative reconstruction, and/or weight based adjustment of the mA/kV was utilized to reduce the radiation dose to as low as reasonably achievable. COMPARISON: None available. CLINICAL HISTORY: Facial trauma, blunt. FINDINGS: FACIAL BONES: Patient is edentulous. No acute facial fracture. No mandibular dislocation. No suspicious bone lesion. ORBITS: Globes are intact. No acute traumatic injury. No inflammatory change. SINUSES AND MASTOIDS: No acute abnormality. SOFT TISSUES: No acute abnormality. IMPRESSION: 1. No acute facial fracture. Electronically signed by: Morgane Naveau MD 05/16/2024 06:27 PM EST RP Workstation: [id number]   CT Cervical Spine Wo Contrast Result Date: 05/16/2024 EXAM: CT CERVICAL SPINE WITHOUT CONTRAST 05/16/2024 05:51:04 PM TECHNIQUE: CT of the cervical spine was performed without the administration of intravenous contrast. Multiplanar reformatted images are provided for review. Automated exposure control, iterative reconstruction, and/or weight based adjustment of the mA/kV was utilized to reduce the radiation dose to as low as reasonably achievable. COMPARISON: None available. CLINICAL HISTORY: Neck trauma (Age >= 65y). Neck trauma. Age >= 65 years. FINDINGS: BONES AND ALIGNMENT: Vague lucency and cortical break of the C2 vertebral body along the odontoid into the lateral masses of C2 suggestive of acute nondisplaced type 3 fracture (6:45 ; 4:33 - 34). Possible extension to the C2 left transverse foramina (8:24). Grade 1 anterolisthesis of C4 on C5. Diffusely decreased bone  density. DEGENERATIVE CHANGES: Multilevel moderate degenerative changes of the spine. SOFT TISSUES: No prevertebral soft tissue swelling. Severe atherosclerotic plaque of the aortic arch and its branches including the carotid arteries within the neck. IMPRESSION: 1. Acute nondisplaced type 3 fracture of the C2 vertebral body with possible extension to the left C2 transverse foramen. 2. Severe atherosclerotic plaque of the aortic arch and its branches including the carotid arteries within the neck. Electronically signed by: Morgane Naveau MD 05/16/2024 06:21 PM EST RP Workstation: [id number]   CT Head Wo Contrast Result Date: 05/16/2024 EXAM: CT HEAD WITHOUT CONTRAST 05/16/2024 05:51:04 PM TECHNIQUE: CT of the head was performed without the administration of intravenous contrast. Automated exposure control, iterative reconstruction, and/or weight based adjustment of the mA/kV was utilized to reduce the radiation dose to as low as reasonably achievable. COMPARISON: None available. CLINICAL HISTORY: Head trauma, minor (Age >= 65 years). FINDINGS: BRAIN AND VENTRICLES: No acute hemorrhage. No  evidence of acute infarct. No hydrocephalus. No extra-axial collection. No mass effect or midline shift. Age-related atrophy. Moderate periventricular and deep white matter hypodensity consistent with chronic small vessel ischemia. ORBITS: Bilateral lens resection. No definite intraconal fat stranding bilaterally. SINUSES: Mild mucosal thickening in ethmoid air cells. SOFT TISSUES AND SKULL: Left frontal scalp hematoma and periorbital soft tissue edema. Atherosclerosis of skullbase vasculature. No skull fracture. IMPRESSION: 1. Left frontal scalp hematoma and periorbital soft tissue edema. 2. No acute intracranial abnormality. Electronically signed by: Morgane Naveau MD 05/16/2024 06:02 PM EST RP Workstation: [id number]   DG Knee Complete 4 Views Left Result Date: 05/16/2024 EXAM: 4 OR MORE VIEW(S) XRAY OF THE LEFT KNEE  05/16/2024 05:32:00 PM COMPARISON: None available. CLINICAL HISTORY: Fall pain. FINDINGS: BONES AND JOINTS: No acute fracture. No malalignment. Diffuse osteopenia. Mild to moderate patellofemoral joint space loss. Mild medial compartment joint space loss. Moderate to severe lateral compartment joint space loss. Osteophyte formation noted. Trace joint effusion. SOFT TISSUES: Peripheral vascular atherosclerosis. Unremarkable. IMPRESSION: 1. Trace knee joint effusion. No acute fracture or dislocation. 2. Tricompartmental osteoarthritis, worst within the lateral compartment, as described above. Electronically signed by: Rogelia Myers MD 05/16/2024 05:51 PM EST RP Workstation: [id number]   DG Forearm Left Result Date: 05/16/2024 EXAM: 2 VIEW(S) XRAY OF THE LEFT FOREARM 05/16/2024 05:32:00 PM COMPARISON: None available. CLINICAL HISTORY: Fall pain. FINDINGS: BONES AND JOINTS: Diffuse osteopenia. No acute fracture. No malalignment. SOFT TISSUES: Peripheral vascular atherosclerosis. IMPRESSION: 1. No acute fracture or dislocation. Electronically signed by: Rogelia Myers MD 05/16/2024 05:48 PM EST RP Workstation: [id number]     Subjective: Pt with no specific complaint today.   Discharge Exam: Vitals:   05/17/24 2017 05/18/24 0455  BP: (!) 145/56 (!) 158/59  Pulse: 69 (!) 55  Resp: 18 18  Temp: 98.1 F (36.7 C) 97.6 F (36.4 C)  SpO2: 96% 98%   Vitals:   05/17/24 0956 05/17/24 1453 05/17/24 2017 05/18/24 0455  BP: (!) 139/48 (!) 167/68 (!) 145/56 (!) 158/59  Pulse: 64 91 69 (!) 55  Resp: 20 20 18 18   Temp: 98.2 F (36.8 C) 98.2 F (36.8 C) 98.1 F (36.7 C) 97.6 F (36.4 C)  TempSrc: Oral  Oral   SpO2: 100% 96% 96% 98%   General: Pt is alert, awake, not in acute distress Neck: pt is in hard C collar.  Cardiovascular: normal S1/S2 +, no rubs, no gallops Respiratory: CTA bilaterally, no wheezing, no rhonchi Abdominal: Soft, NT, ND, bowel sounds + Extremities: no edema, no cyanosis   The  results of significant diagnostics from this hospitalization (including imaging, microbiology, ancillary and laboratory) are listed below for reference.     Microbiology: Recent Results (from the past 240 hours)  Urine Culture     Status: Abnormal (Preliminary result)   Collection Time: 05/16/24  8:43 PM   Specimen: Urine, Clean Catch  Result Value Ref Range Status   Specimen Description   Final    URINE, CLEAN CATCH Performed at [facility], [address] [place], [place] [address]    Special Requests   Final    NONE Performed at [facility], [address] [place], [place] [address]    Culture (A)  Final    >=100,000 COLONIES/mL ESCHERICHIA COLI SUSCEPTIBILITIES TO FOLLOW Performed at [facility], [address] [place], [place] [address]    Report Status PENDING  Incomplete  Culture, blood (Routine X 2) w Reflex to ID Panel     Status: None (  Preliminary result)   Collection Time: 05/17/24 12:14 AM   Specimen: BLOOD  Result Value Ref Range Status   Specimen Description BLOOD BLOOD RIGHT WRIST  Final   Special Requests   Final    BOTTLES DRAWN AEROBIC ONLY Blood Culture adequate volume   Culture   Final    NO GROWTH 1 DAY Performed at [facility], [address] [place], [place] [address]    Report Status PENDING  Incomplete  Culture, blood (Routine X 2) w Reflex to ID Panel     Status: None (Preliminary result)   Collection Time: 05/17/24 12:26 AM   Specimen: BLOOD  Result Value Ref Range Status   Specimen Description BLOOD BLOOD LEFT ARM  Final   Special Requests   Final    BOTTLES DRAWN AEROBIC ONLY Blood Culture adequate volume   Culture   Final    NO GROWTH 1 DAY Performed at [facility], [address], [place], [place] [address]    Report Status PENDING  Incomplete     Labs: BNP (last 3 results) No results for input(s): BNP in the last 8760 hours. Basic Metabolic Panel: Recent Labs  Lab 05/16/24 1811  NA 142  K 4.2  CL 103  CO2 25   GLUCOSE 87  BUN 26*  CREATININE 1.14*  CALCIUM 9.6   Liver Function Tests: No results for input(s): AST, ALT, ALKPHOS, BILITOT, PROT, ALBUMIN in the last 168 hours. No results for input(s): LIPASE, AMYLASE in the last 168 hours. No results for input(s): AMMONIA in the last 168 hours. CBC: Recent Labs  Lab 05/16/24 1811 05/18/24 0433  WBC 17.4* 8.5  NEUTROABS 15.5* 5.8  HGB 13.4 11.3*  HCT 42.4 35.9*  MCV 93.4 92.8  PLT 249 216   Cardiac Enzymes: No results for input(s): CKTOTAL, CKMB, CKMBINDEX, TROPONINI in the last 168 hours. BNP: Invalid input(s): POCBNP CBG: Recent Labs  Lab 05/17/24 0952 05/17/24 1731 05/18/24 0741 05/18/24 1111  GLUCAP 104* 169* 174* 203*   D-Dimer No results for input(s): DDIMER in the last 72 hours. Hgb A1c No results for input(s): HGBA1C in the last 72 hours. Lipid Profile No results for input(s): CHOL, HDL, LDLCALC, TRIG, CHOLHDL, LDLDIRECT in the last 72 hours. Thyroid function studies No results for input(s): TSH, T4TOTAL, T3FREE, THYROIDAB in the last 72 hours.  Invalid input(s): FREET3 Anemia work up No results for input(s): VITAMINB12, FOLATE, FERRITIN, TIBC, IRON, RETICCTPCT in the last 72 hours. Urinalysis    Component Value Date/Time   COLORURINE YELLOW 05/16/2024 2043   APPEARANCEUR HAZY (A) 05/16/2024 2043   APPEARANCEUR Cloudy (A) 05/09/2019 1159   LABSPEC 1.017 05/16/2024 2043   PHURINE 6.0 05/16/2024 2043   GLUCOSEU NEGATIVE 05/16/2024 2043   HGBUR NEGATIVE 05/16/2024 2043   BILIRUBINUR NEGATIVE 05/16/2024 2043   BILIRUBINUR Negative 05/09/2019 1159   KETONESUR NEGATIVE 05/16/2024 2043   PROTEINUR NEGATIVE 05/16/2024 2043   UROBILINOGEN 0.2 11/02/2013 1136   NITRITE POSITIVE (A) 05/16/2024 2043   LEUKOCYTESUR MODERATE (A) 05/16/2024 2043   Sepsis Labs Recent Labs  Lab 05/16/24 1811 05/18/24 0433  WBC 17.4* 8.5   Microbiology Recent Results  (from the past 240 hours)  Urine Culture     Status: Abnormal (Preliminary result)   Collection Time: 05/16/24  8:43 PM   Specimen: Urine, Clean Catch  Result Value Ref Range Status   Specimen Description   Final    URINE, CLEAN CATCH Performed at [facility], [address], [place], [place] [address]  Special Requests   Final    NONE Performed at [facility], [address] [place], [place] [address]    Culture (A)  Final    >=100,000 COLONIES/mL ESCHERICHIA COLI SUSCEPTIBILITIES TO FOLLOW Performed at [facility], [address] [place], [place] [address]    Report Status PENDING  Incomplete  Culture, blood (Routine X 2) w Reflex to ID Panel     Status: None (Preliminary result)   Collection Time: 05/17/24 12:14 AM   Specimen: BLOOD  Result Value Ref Range Status   Specimen Description BLOOD BLOOD RIGHT WRIST  Final   Special Requests   Final    BOTTLES DRAWN AEROBIC ONLY Blood Culture adequate volume   Culture   Final    NO GROWTH 1 DAY Performed at [facility], [address], [place], [place] [address]    Report Status PENDING  Incomplete  Culture, blood (Routine X 2) w Reflex to ID Panel     Status: None (Preliminary result)   Collection Time: 05/17/24 12:26 AM   Specimen: BLOOD  Result Value Ref Range Status   Specimen Description BLOOD BLOOD LEFT ARM  Final   Special Requests   Final    BOTTLES DRAWN AEROBIC ONLY Blood Culture adequate volume   Culture   Final    NO GROWTH 1 DAY Performed at [facility], [address], [place], [place] [address]    Report Status PENDING  Incomplete   Time coordinating discharge: 38 mins   SIGNED:  Afton Louder, MD  [facility] 05/18/2024, 12:32 PM How to contact the [facility] Attending or Consulting provider 7A - 7P or covering provider during after hours 7P -7A, for this patient?  Check the care team in [facility] and look for a) attending/consulting [facility] provider listed and b) the [facility] team listed Log into  [email or web address] and use [facility]'s universal password to access. If you do not have the password, please contact the hospital operator. Locate the [facility] provider you are looking for under [facility] and page to a number that you can be directly reached. If you still have difficulty reaching the provider, please page the [facility] (Director on Call) for the Hospitalists listed on amion for assistance.     [1]  Allergies Allergen Reactions   Iodine Other (See Comments)    Pt granddaughter reports pt is allergic to iodine, unsure of reaction

## 2024-05-18 NOTE — Care Management Important Message (Signed)
 Important Message  Patient Details  Name: [name] MRN: [id number] Date of Birth: 06/10/1932   Important Message Given:  N/A - LOS <3 / Initial given by admissions     [name] L Amour Trigg 05/18/2024, 10:51 AM

## 2024-05-18 NOTE — Plan of Care (Signed)
  Problem: Education: Goal: Knowledge of General Education information will improve Description: Including pain rating scale, medication(s)/side effects and non-pharmacologic comfort measures Outcome: Progressing   Problem: Activity: Goal: Risk for activity intolerance will decrease Outcome: Progressing   Problem: Pain Managment: Goal: General experience of comfort will improve and/or be controlled Outcome: Progressing

## 2024-05-19 ENCOUNTER — Ambulatory Visit: Payer: Self-pay | Admitting: Family Medicine

## 2024-05-19 LAB — URINE CULTURE: Culture: >=100000 — AB

## 2024-05-19 NOTE — Progress Notes (Signed)
 Called [facility] and spoke with Vickie and orders placed to stop Duricef and start augmentin 875 mg BID x 3 days.  KYM FREDRIK Louder, MD 05/19/24

## 2024-05-22 ENCOUNTER — Non-Acute Institutional Stay (SKILLED_NURSING_FACILITY): Admitting: Adult Health

## 2024-05-22 DIAGNOSIS — S12101D Unspecified nondisplaced fracture of second cervical vertebra, subsequent encounter for fracture with routine healing: Secondary | ICD-10-CM | POA: Diagnosis not present

## 2024-05-22 DIAGNOSIS — E1169 Type 2 diabetes mellitus with other specified complication: Secondary | ICD-10-CM | POA: Insufficient documentation

## 2024-05-22 DIAGNOSIS — Z794 Long term (current) use of insulin: Secondary | ICD-10-CM | POA: Diagnosis not present

## 2024-05-22 DIAGNOSIS — K5909 Other constipation: Secondary | ICD-10-CM | POA: Diagnosis not present

## 2024-05-22 DIAGNOSIS — E785 Hyperlipidemia, unspecified: Secondary | ICD-10-CM | POA: Diagnosis not present

## 2024-05-22 DIAGNOSIS — I152 Hypertension secondary to endocrine disorders: Secondary | ICD-10-CM | POA: Diagnosis not present

## 2024-05-22 DIAGNOSIS — N390 Urinary tract infection, site not specified: Secondary | ICD-10-CM | POA: Diagnosis not present

## 2024-05-22 DIAGNOSIS — E1165 Type 2 diabetes mellitus with hyperglycemia: Secondary | ICD-10-CM

## 2024-05-22 DIAGNOSIS — E1159 Type 2 diabetes mellitus with other circulatory complications: Secondary | ICD-10-CM

## 2024-05-22 LAB — CULTURE, BLOOD (ROUTINE X 2)
Culture: NO GROWTH
Culture: NO GROWTH
Special Requests: ADEQUATE
Special Requests: ADEQUATE

## 2024-05-23 ENCOUNTER — Non-Acute Institutional Stay: Payer: Self-pay | Admitting: Internal Medicine

## 2024-05-23 ENCOUNTER — Encounter: Payer: Self-pay | Admitting: Internal Medicine

## 2024-05-23 DIAGNOSIS — R29818 Other symptoms and signs involving the nervous system: Secondary | ICD-10-CM | POA: Insufficient documentation

## 2024-05-23 DIAGNOSIS — Z794 Long term (current) use of insulin: Secondary | ICD-10-CM

## 2024-05-23 DIAGNOSIS — S12101G Unspecified nondisplaced fracture of second cervical vertebra, subsequent encounter for fracture with delayed healing: Secondary | ICD-10-CM

## 2024-05-23 DIAGNOSIS — N390 Urinary tract infection, site not specified: Secondary | ICD-10-CM

## 2024-05-23 NOTE — Assessment & Plan Note (Signed)
 Neurosurgical follow-up is scheduled.  Continue cervical hard collar.  PT/OT at SNF as tolerated.

## 2024-05-23 NOTE — Progress Notes (Unsigned)
 "   NURSING HOME LOCATION:  [facility] ROOM NUMBER:  [id number]  CODE STATUS:  Full Code  PCP:  Gaither Langton MD  This is a comprehensive admission note to this SNFperformed on this date less than 30 days from date of admission. Included are preadmission medical/surgical history; reconciled medication list; family history; social history and comprehensive review of systems.  Corrections and additions to the records were documented. Comprehensive physical exam was also performed. Additionally a clinical summary was entered for each active diagnosis pertinent to this admission in the Problem List to enhance continuity of care.  HPI: She was hospitalized 1/28 - 05/18/2024, presenting to the ED due to an unwitnessed fall at home associated with altered mental status.  She lives at home with her grandson.  Reportedly she tripped and fell; family members postulate she may have tripped on a long gown.  She was found on floor; there was no reported loss of consciousness.  Prior to arrival in the ED there were 2 episodes of vomiting.  She is an insulin -dependent diabetic; there was no suggestion that the fall was in the context of profound hypoglycemia. CT of the head revealed left frontal scalp hematoma and periorbital soft tissue edema.  There was an acute nondisplaced type III fracture CT vertebral body with questionable extension.  Severe atherosclerotic plaque was present in the aortic arch and branches.  Neurosurgery consulted and recommended a hard cervical collar.  CT of the head had also revealed age-related atrophy and moderate periventricular and deep white matter hypodensitis consistent with chronic small vessel ischemic change. Initial white count was 17,400; subsequent value was 8500.  Course was complicated by E. coli UTI.  She received Ceftin for 2 days with transition to Duricef for 3 days.  Subsequently she was placed on oral Augmentin which was completed 2/3.  Initial creatinine was  1.14 with a GFR of 46.  Normochromic, normocytic anemia was present with a low H/H of 11.3/35.9.  As noted she has a diagnosis of diabetes; glucose while hospitalized ranged from a low of 87 up to high 230; she was on 30 units of Lantus .  There is no A1c in Epic. PT/OT consulted and recommended SNF placement for rehab.  Here at the SNF, despite 30 units of basal insulin , the blood sugar average for the last 3 days has been 289 ,with a range of 204 up to 412.  She received 10 units of Humalog for the peak glucose of 412.  Recheck within 2 hours was 377.  A1c has been ordered for 2/5.  Past medical and surgical history: Includes essential hypertension; history of postoperative nausea and vomiting; macular degeneration; and diabetes with retinopathy. Surgeries and procedures include abdominal hysterectomy; breast surgery; and rectocele repair.  Family history: reviewed, non contributory due to advanced age.  Social history: Non-smoker nondrinker.  She apparently lives alone by herself above her grandson's rooms.   Review of systems: Clinical neurocognitive deficits made validity of responses questionable , compromising ROS completion.  She can provide no information as to diagnoses and recommendations made during hospitalization.  The family states that she had not been falling prior to this event which precipitated her admission.  As I was examining her legs she made the comment that is where I had surgery.  Her son states that he knows of no procedures on her legs.  Physical exam:  Pertinent or positive findings: She appears her age.  She is wearing a hard cervical collar.  She  is profoundly deaf.  She has periorbital ecchymoses greater on the left than the right as well as along the left temple.  She is edentulous.  Breath sounds are decreased.  Heart rhythm is slightly irregular.  Abdomen is protuberant.  Pedal pulses are not palpable.  She has 1/2+ edema at the sock line.  Interosseous wasting  and limb atrophy are present.  There is ecchymoses over the dorsum of the hands, greater on the right than the left.  The shins are dry with keratotic changes & faint, nonblanching erythematous changes without clinical cellulitis.  General appearance:  no acute distress, increased work of breathing is present.   Lymphatic: No lymphadenopathy about the head, neck, axilla. Eyes: No conjunctival inflammation or lid edema is present. There is no scleral icterus. Ears:  External ear exam shows no significant lesions or deformities.   Nose:  External nasal examination shows no deformity or inflammation. Nasal mucosa are pink and moist without lesions, exudates Neck:  No thyromegaly, masses, tenderness noted.    Heart:  No gallop, murmur, click, rub.  Lungs:  without wheezes, rhonchi, rales, rubs. Abdomen: Bowel sounds are normal.  Abdomen is soft and nontender with no organomegaly, hernias, masses. GU: Deferred  Extremities:  No cyanosis, clubbing. Neurologic exam:  Balance, Rhomberg, finger to nose testing could not be completed due to clinical state Skin: Warm & dry w/o tenting. No significant rash.  See clinical summary under each active problem in the Problem List with associated updated therapeutic plan :  C2 cervical fracture [facility]) Neurosurgical follow-up as scheduled.  Continue cervical hard collar.  PT/OT at SNF as able to participate in the context of severe dementia.  Type 2 diabetes mellitus with hyperglycemia, with long-term current use of insulin  (HCC) Glucoses while hospitalized ranged from 87 up to a high of 230. She is on 30 units of insulin .  No A1c in Epic. Here at the SNF glucoses have ranged from 204 up to 412.  On 05/22/2024 at 1645 glucose 412; she received 10 units of Humalog.  Recheck glucose at 1840 hrs. was 377. Increase basal to 35 u.  Neurocognitive deficits She has no concept of why she was hospitalized or what diagnoses or recommendations were made.  She also  exhibited confabulation.  MMSE will be performed at the SNF.  UTI (urinary tract infection) As she received  Ceftin , Duricef & finally Augmentin with some question as to susceptibility of the E. coli to at least 2 of these antibiotics; recheck UA in 48-72 hours.  "

## 2024-05-23 NOTE — Assessment & Plan Note (Signed)
 She has no concept of why she was hospitalized or what diagnoses or recommendations were made.  She also exhibited confabulation.  MMSE will be performed at the SNF.

## 2024-05-23 NOTE — Patient Instructions (Signed)
 See assessment and plan under each diagnosis in the problem list and acutely for this visit

## 2024-05-23 NOTE — Assessment & Plan Note (Signed)
 Glucoses while hospitalized ranged from 87 up to a high of 230. She is on 30 units of insulin .  No A1c in Epic. Here at the SNF glucoses have ranged from 204 up to 412.  On 05/22/2024 at 1645 glucose 412; she received 10 units of Humalog.  Recheck glucose at 1840 hrs. was 377. Increase basal to 35 u.

## 2024-05-24 ENCOUNTER — Ambulatory Visit: Payer: Self-pay

## 2024-05-24 ENCOUNTER — Other Ambulatory Visit (HOSPITAL_COMMUNITY)
Admission: RE | Admit: 2024-05-24 | Discharge: 2024-05-24 | Disposition: A | Source: Skilled Nursing Facility | Attending: Adult Health | Admitting: Adult Health

## 2024-05-24 LAB — URINALYSIS, ROUTINE W REFLEX MICROSCOPIC
Bilirubin Urine: NEGATIVE
Glucose, UA: 150 mg/dL — AB
Hgb urine dipstick: NEGATIVE
Ketones, ur: NEGATIVE mg/dL
Leukocytes,Ua: NEGATIVE
Nitrite: NEGATIVE
Protein, ur: NEGATIVE mg/dL
Specific Gravity, Urine: 1.021 (ref 1.005–1.030)
pH: 5 (ref 5.0–8.0)

## 2024-05-24 LAB — HEMOGLOBIN A1C
Hgb A1c MFr Bld: 6.4 % — ABNORMAL HIGH (ref 4.8–5.6)
Mean Plasma Glucose: 136.98 mg/dL

## 2024-05-24 NOTE — Telephone Encounter (Signed)
 FYI Only or Action Required?: Action required by provider: update on patient condition and one time order request.  Patient was last seen in primary care on 11/28/2023 by PCP.  Called Nurse Triage reporting Blood Sugar Problem.  Symptoms began today.  Interventions attempted: Prescription medications: lantus  30 untits.  Symptoms are: unchanged.  Triage Disposition: Call PCP Now  Patient/caregiver understands and will follow disposition?: Yes    Message from [facility] sent at 05/24/2024  5:47 PM EST  Summary: elevated Blood sugar   Reason for Triage: patient has elevated blood sugar of 402 [name] with the [facility] is calling            Reason for Disposition  Blood glucose > 400 mg/dL (77.7 mmol/L)  Answer Assessment - Initial Assessment Questions [name], nurse with [facility], contact clinic to report blood glucose of 402. Pt has had higher trending glucose levels and only has standing order of lantus  30 units daily; no other insulins or PO meds. Fasting glucose 155 this morning. Nurse reports 1 time order for humalog 10 units on 05/22/24. Pt sees Kimberly Seip, NP at [facility]. Nurse is requesting orders for additional one time dose to tx pt this evening then providers will be back to facility in am and can continue care. Attempted to contact on call provider; provided provider with blood sugar trends for today but no trends for yesterday so provider requested this RN call facility RN back and provider [facility] with on call providers number for them to speak directly. This is not our nursing process, escalated to management for further continuation of care for pt. Will route to clinic high priority for clinic update.      1. BLOOD GLUCOSE: What is your blood glucose level?      402 current   2. ONSET: When did you check the blood glucose?     05:30  3. USUAL RANGE: What is your glucose level usually? (e.g., usual fasting morning value, usual evening value)      Trends have been higher today 02/05 at 5:15 fasting 155  5. TYPE 1 or 2:  Do you know what type of diabetes you have?  (e.g., Type 1, Type 2, Gestational; doesn't know)      Type II   6. INSULIN : Do you take insulin ? What type of insulin (s) do you use? What is the mode of delivery? (syringe, pen; injection or pump)?      Lantus  30mg  once daily   7. DIABETES PILLS: Do you take any pills for your diabetes? If Yes, ask: Have you missed taking any pills recently?     No   8. OTHER SYMPTOMS: Do you have any symptoms? (e.g., fever, frequent urination, difficulty breathing, dizziness, weakness, vomiting)     Asymptomatic  Protocols used: Diabetes - High Blood Sugar-A-AH

## 2024-05-25 NOTE — Telephone Encounter (Signed)
 See triage notes from nurse. Please Advise patient is a SNF patient

## 2024-05-25 NOTE — Assessment & Plan Note (Signed)
 As she received  Ceftin , Duricef & finally Augmentin with some question as to susceptibility of the E. coli to at least 2 of these antibiotics; recheck UA in 48-72 hours.
